# Patient Record
Sex: Female | Born: 1945 | Race: Black or African American | Hispanic: No | Marital: Married | Smoking: Former smoker
Health system: Southern US, Community
[De-identification: ages and names within clinical notes are randomized; demographics above are authoritative.]

## PROBLEM LIST (undated history)

## (undated) DIAGNOSIS — G4700 Insomnia, unspecified: Secondary | ICD-10-CM

## (undated) DIAGNOSIS — M1990 Unspecified osteoarthritis, unspecified site: Secondary | ICD-10-CM

## (undated) DIAGNOSIS — K635 Polyp of colon: Secondary | ICD-10-CM

## (undated) DIAGNOSIS — I639 Cerebral infarction, unspecified: Secondary | ICD-10-CM

## (undated) DIAGNOSIS — C181 Malignant neoplasm of appendix: Secondary | ICD-10-CM

## (undated) DIAGNOSIS — I739 Peripheral vascular disease, unspecified: Secondary | ICD-10-CM

## (undated) DIAGNOSIS — K5790 Diverticulosis of intestine, part unspecified, without perforation or abscess without bleeding: Secondary | ICD-10-CM

## (undated) DIAGNOSIS — R718 Other abnormality of red blood cells: Secondary | ICD-10-CM

## (undated) DIAGNOSIS — I10 Essential (primary) hypertension: Secondary | ICD-10-CM

## (undated) HISTORY — DX: Diverticulosis of intestine, part unspecified, without perforation or abscess without bleeding: K57.90

## (undated) HISTORY — PX: CYST REMOVAL NECK: SHX6281

## (undated) HISTORY — DX: Other abnormality of red blood cells: R71.8

## (undated) HISTORY — DX: Peripheral vascular disease, unspecified: I73.9

## (undated) HISTORY — DX: Cerebral infarction, unspecified: I63.9

## (undated) HISTORY — DX: Insomnia, unspecified: G47.00

## (undated) HISTORY — DX: Essential (primary) hypertension: I10

## (undated) HISTORY — DX: Unspecified osteoarthritis, unspecified site: M19.90

## (undated) HISTORY — DX: Polyp of colon: K63.5

---

## 2006-09-17 ENCOUNTER — Ambulatory Visit (HOSPITAL_COMMUNITY): Admission: RE | Admit: 2006-09-17 | Discharge: 2006-09-17 | Payer: Self-pay | Admitting: Obstetrics and Gynecology

## 2006-12-25 ENCOUNTER — Encounter: Admission: RE | Admit: 2006-12-25 | Discharge: 2006-12-25 | Payer: Self-pay | Admitting: Obstetrics and Gynecology

## 2007-07-03 HISTORY — PX: ABDOMINAL HYSTERECTOMY: SHX81

## 2008-01-09 ENCOUNTER — Ambulatory Visit: Payer: Self-pay | Admitting: Internal Medicine

## 2008-01-19 ENCOUNTER — Ambulatory Visit: Payer: Self-pay | Admitting: Internal Medicine

## 2008-01-19 ENCOUNTER — Encounter: Payer: Self-pay | Admitting: Internal Medicine

## 2008-01-21 ENCOUNTER — Encounter: Payer: Self-pay | Admitting: Internal Medicine

## 2008-02-12 ENCOUNTER — Encounter: Admission: RE | Admit: 2008-02-12 | Discharge: 2008-02-12 | Payer: Self-pay | Admitting: Obstetrics and Gynecology

## 2008-02-23 ENCOUNTER — Ambulatory Visit (HOSPITAL_COMMUNITY): Admission: RE | Admit: 2008-02-23 | Discharge: 2008-02-24 | Payer: Self-pay | Admitting: Obstetrics and Gynecology

## 2008-02-23 ENCOUNTER — Encounter (INDEPENDENT_AMBULATORY_CARE_PROVIDER_SITE_OTHER): Payer: Self-pay | Admitting: Obstetrics and Gynecology

## 2009-02-21 ENCOUNTER — Encounter: Admission: RE | Admit: 2009-02-21 | Discharge: 2009-02-21 | Payer: Self-pay | Admitting: Obstetrics and Gynecology

## 2009-02-24 ENCOUNTER — Encounter: Admission: RE | Admit: 2009-02-24 | Discharge: 2009-02-24 | Payer: Self-pay | Admitting: Obstetrics and Gynecology

## 2010-02-27 ENCOUNTER — Encounter: Admission: RE | Admit: 2010-02-27 | Discharge: 2010-02-27 | Payer: Self-pay | Admitting: Obstetrics and Gynecology

## 2010-04-07 ENCOUNTER — Encounter: Admission: RE | Admit: 2010-04-07 | Discharge: 2010-04-07 | Payer: Self-pay | Admitting: Internal Medicine

## 2010-05-02 ENCOUNTER — Ambulatory Visit: Payer: Self-pay | Admitting: Vascular Surgery

## 2010-07-23 ENCOUNTER — Encounter: Payer: Self-pay | Admitting: Obstetrics and Gynecology

## 2010-11-14 NOTE — Op Note (Signed)
Andrea Keller, Andrea Keller               ACCOUNT NO.:  [id number]   MEDICAL RECORD NO.:  [id number]          PATIENT TYPE:  OIB   LOCATION:  9318                          FACILITY:  [facility]   PHYSICIAN:  Duke Salvia. Marcelle Overlie, M.D.DATE OF BIRTH:  1945/07/12   DATE OF PROCEDURE:  02/23/2008  DATE OF DISCHARGE:                               OPERATIVE REPORT   PREOPERATIVE DIAGNOSES:  Symptomatic uterine prolapse with cystocele and  rectocele.   POSTOPERATIVE DIAGNOSES:  Symptomatic uterine prolapse with cystocele  and rectocele.   PROCEDURE:  1. Laparoscopic-assisted vaginal hysterectomy.  2. Bilateral salpingo-oophorectomy.  3. Anterior and posterior repair.   SURGEON:  Duke Salvia. Marcelle Overlie, MD   ASSISTANT:  Zelphia Cairo, MD   ANESTHESIA:  General endotracheal.   COMPLICATIONS:  None.   DRAINS:  Foley catheter.   BLOOD LOSS:  200 mL.   PROCEDURE AND FINDINGS:  The patient was taken to the operating room  after an adequate level of general endotracheal anesthesia was obtained  with the patient's legs in stirrups.  The abdomen, perineum, and vagina  were prepped and draped in usual manner for laparoscopy.  Hulka  tenaculum was positioned.  There was significant uterus itself was  normal size, mid to posterior with moderate prolapse, cervix close to  the introitus, cystocele and a small rectocele noted on EUA.  The  periumbilical area was infiltrated with 0.25% Marcaine.  A small  incision was made.  The Veress needle was introduced without difficulty.  Its intra-abdominal position was verified by pressure and water testing.  After 2.5 liter pneumoperitoneum was created, laparoscopic trocar and  sleeve were then introduced without difficulty.  Three fingerbreadths  above the symphysis in the midline, a 5-mm trocar was inserted under  direct visualization.  The uterus itself was normal size, was  retroflexed and positioned.  Posterior cul-de-sac unremarkable.  Both  ovaries looked  atrophic.  No other abnormalities were noted.  Blunt  grasper was then used to grasp the right tube and ovary, which were  placed on traction toward the midline.  Right pelvic sidewall was  observed and the course of the ureter was traced well below.  The right  IP ligament was then coagulated and divided with the EnSeal instrument  down to and including the round ligament.  The exact same repeated on  the opposite side after carefully identifying the course of the ureter  below.  The laparoscopic portion of procedure was stopped at that point.  The legs were extended.  A weighted-speculum was placed vaginally,  cervix was grasped with the tenaculum.  The cervical vaginal mucosa was  incised with the Bovie.  Posterior colpotomy performed without  difficulty.  The bladder was advanced superiorly with sharp and blunt  dissection until the peritoneal reflection could be identified.  The  peritoneum was then entered sharply in a retractor used to gently  elevate the bladder out of the field.  Staying close to the uterus in  sequential manner, the LigaSure instrument was used to coagulate and  divide the uterosacral ligament, cardinal ligament, uterine vasculature  pedicles and upper  broad ligament pedicles.  The fundus of the uterus  was then delivered posteriorly.  Remaining pedicles were clamped,  divided and the specimen was removed. Free tie 0 Vicryl was used on the  last 2 pedicles.  The cuff was then closed from 3-9 o'clock with a  running locked 2-0 Vicryl suture.  Prior to closure, sponge, needle, and  instrument counts were reported as correct x2.  The vaginal mucosa  closed right-to-left with 2-0 Monocryl sutures.  Allis clamps were then  used to place the distal anterior vagina on traction and anterior repair  was carried out by dividing the mucosa in the midline from the level of  the cuff up to several centimeters below the urethra.  Sharp and blunt  dissection was then used to  dissect the paravesical tissue.  The  paravesical fascia was then plicated in the midline with 2-0 Vicryl  sutures reducing the cystocele.  Excess vaginal mucosa was trimmed and  then the mucosa reapproximated with 2-0 Monocryl interrupted sutures.  A  small rectocele was noted.  A small triangle of perineal skin was  excised and the posterior vaginal mucosa was dissected in the midline  approximately halfway up.  Sharp and blunt dissection was then used to  reduce the rectocele.  The perirectal fascia was then plicated in the  midline with 2-0 Vicryl sutures.  A very small amount of posterior  mucosa was excised and then the posterior vaginal mucosa was  reapproximated with 2-0 Vicryl sutures, 3-0 Vicryl Rapide used on the  perineum, estrogen and Estrace laced, vaginal pack was then used to  perform vaginal packing and a Foley catheter was positioned.  Repeat  laparoscopy was carried out revealing excellent hemostasis even at  reduced pressure.  No irrigation was carried out because of the  excellent hemostasis.  Instruments were removed.  Gas was allowed to  escape.  The defects were closed with 4-0 Vicryl subcuticular sutures  and Dermabond.  She tolerated this well and went to recovery room in  good condition.      Richard M. Marcelle Overlie, M.D.  Electronically Signed     RMH/MEDQ  D:  02/23/2008  T:  02/23/2008  Job:  [id number]

## 2010-11-14 NOTE — H&P (Signed)
NAMEBETTYE, Andrea Keller               ACCOUNT NO.:  [id number]   MEDICAL RECORD NO.:  [id number]          PATIENT TYPE:  AMB   LOCATION:                                FACILITY:  [facility]   PHYSICIAN:  Duke Salvia. Marcelle Overlie, M.D.DATE OF BIRTH:  05/24/1946   DATE OF ADMISSION:  02/23/2008  DATE OF DISCHARGE:                              HISTORY & PHYSICAL   CHIEF COMPLAINT:  Pelvic relaxation, symptomatic cystocele.   HISTORY OF PRESENT ILLNESS:  A 65 year old G2 P2, she has had one  cesarean and one vaginal delivery presents with a 8-month history of a  increased bulge in the pelvic area especially on standing.  No SUI  symptoms.  Examination in the office revealed a significant cystocele,  small rectocele.  Pelvic ultrasound was done in March 2008, when she  initially presented with some of these symptoms that showed normal  pelvic findings.  Symptoms have worsened and she presents now for  definitive LAVH/BSO with anterior and posterior repair.  This procedure  including risks related to bleeding, infection, adjacent organ injury,  transfusion, the possible need for open additional surgery,  postoperative catheterization, and other risks related to wound,  infection, and phlebitis are expected, and postoperative recovery time  all reviewed with her which she understands and accepts.   In the interim, she has had screening colonoscopy that was normal  recently.  Mammogram was up-to-date also and was normal.   PAST MEDICAL HISTORY:   CURRENT MEDICATIONS:  Amlodipine.   REVIEW OF SYSTEMS:  Significant for the patient with hypertension.   Also positive family history of hypertension.   PRIOR SURGERY:  Cesarean section x1, vaginal delivery x1 as noted.   PHYSICAL EXAMINATION:  VITAL SIGNS:  Temperature 98.2, blood pressure  138/72.  HEENT:  Unremarkable.  NECK:  Supple without masses.  LUNGS: Clear.  CARDIOVASCULAR:  Regular rate and rhythm without murmurs, rubs, or  gallops noted.  BREASTS:  Without masses.  ABDOMEN:  Soft, flat, and nontender.  PELVIC EXAM:  Vulva unremarkable.  A moderate cystocele was noted on  examination with a small rectocele and mild uterine prolapse.  Uterus,  mid, posterior, normal size, and mobile.  Adnexa negative.  NEUROLOGIC EXAM:  Unremarkable.   IMPRESSION:  Symptomatic uterine prolapse with cystocele and rectocele.   PLAN:  LAVH/BSO, anterior posterior repair.  Procedure and risks  reviewed as above.      Richard M. Marcelle Overlie, M.D.  Electronically Signed     RMH/MEDQ  D:  02/16/2008  T:  02/17/2008  Job:  [id number]

## 2010-11-14 NOTE — Consult Note (Signed)
NEW PATIENT CONSULTATION   Andrea Keller, Andrea Keller  DOB:  1946/05/02                                       05/02/2010  EAVWU#:[id number]   Correne Lalani presents today for discussion of recent outpatient carotid  duplex evaluation.  She had the study at [facility].  This showed  intimal thickening with no severe stenosis.  She specifically denies any  prior amaurosis fugax, transient ischemic attack or stroke.   PAST MEDICAL HISTORY:  Significant for hypertension.  She does not have  diabetes or cardiac disease or history of congestive heart failure.   SOCIAL HISTORY:  She is retired, married with 2 children.  She quit  smoking on April 01, 2010 and does not drink alcohol.   FAMILY HISTORY:  Negative for premature atherosclerotic disease.   REVIEW OF SYSTEMS:  Positive for some weight gain up to 186 pounds.  She  is 5 feet 2 inches tall.  She does have some pain in her legs with  walking.  GI:  Positive for some occasional swallowing difficulty.  ENT for nosebleeds.  MUSCULOSKELETAL:  Positive for joint pain.  Otherwise review of systems is negative except for history of present  illness.   PHYSICAL EXAMINATION:  Well-developed, well-nourished black female  appearing her stated age of 50.  Blood pressure 127/75, pulse 57, respirations 16.  HEENT:  Normal.  Her carotid arteries are without bruits bilaterally.  Her radial pulses  are 2+.  She has 2+ femoral pulses bilaterally.  CHEST:  Clear bilaterally without rales, rhonchi or wheezes.  HEART:  Regular rate rhythm.  ABDOMEN:  Soft, nontender.  No masses noted.  MUSCULOSKELETAL:  Shows no major deformities or cyanosis.  NEUROLOGIC:  No focal weakness or paresthesias.  SKIN:  Without ulcers or rashes.  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  She does not have any venous  varicosities.   I have reviewed her duplex with her.  This does show intimal thickening  with no  evidence of flow-limiting stenosis.  I have reassured Ms.  Denis that I would not recommend any further follow-up due to the  very mild degree of narrowing.  She understands that if she should  develop any neurologic deficits, we would recommend repeat evaluation at  that time.     Larina Earthly, M.D.  Electronically Signed   TFE/MEDQ  D:  05/02/2010  T:  05/03/2010  Job:  [id number]

## 2010-11-14 NOTE — Discharge Summary (Signed)
NAMESAMHITHA, Andrea Keller               ACCOUNT NO.:  [id number]   MEDICAL RECORD NO.:  [id number]          PATIENT TYPE:  OIB   LOCATION:  9318                          [facility]ACILITY:  [facility]   PHYSICIAN:  Duke Salvia. Marcelle Overlie, M.D.DATE OF BIRTH:  1946-06-08   DATE OF ADMISSION:  02/23/2008  DATE OF DISCHARGE:  02/24/2008                               DISCHARGE SUMMARY   DISCHARGE DIAGNOSIS:  1. Symptomatic uterine prolapse with cystocele and rectocele.  2. Laparoscopic-assisted vaginal hysterectomy, bilateral salpingo-      oophorectomy, anterior and posterior repair.   Summary of the history and physical exam, please see admission H and P  for details.  Briefly, a 65 year old with symptomatic cystocele and  uterine prolapse presents for surgical correction.   HOSPITAL COURSE:  On February 23, 2008, under general anesthesia, the  patient underwent LAVH, BSO with A and P repair.  First postop day, her  vaginal packing was removed.  Her catheter was discontinued.  She was  voiding without difficulty, was afebrile, ambulating normally, and  tolerating regular diet.  Her abdominal exam was unremarkable at that  point and she was ready for discharge.   LABORATORY DATA:  On admission, CMET normal.  CBC on admission,  hemoglobin 12.0, hematocrit 38.4, and platelet count 264,000.  MCV  slightly low at 74.5.  On the first postop day February 24, 2008, WBC  13.6, hemoglobin 10.6, and hematocrit 33.7.   DISPOSITION:  The patient discharged on Tylox p.r.n. pain, stool  softeners daily, laxative use p.r.n. was discussed.  Advised to report  any fever over 101, difficulties with voiding, and increased vaginal  bleeding.  She was given specific instructions regarding diet, sex, and  exercise.  She will return to the office in 1 week.   CONDITION:  Good.   ACTIVITY:  Graded increase.      Richard M. Marcelle Overlie, M.D.  Electronically Signed     RMH/MEDQ  D:  02/24/2008  T:  02/24/2008  Job:  [id number]

## 2010-12-09 ENCOUNTER — Emergency Department (HOSPITAL_COMMUNITY)
Admission: EM | Admit: 2010-12-09 | Discharge: 2010-12-10 | Disposition: A | Discharge status: Home / Self Care | Payer: Medicare Other | Attending: Emergency Medicine | Admitting: Emergency Medicine

## 2010-12-09 DIAGNOSIS — N898 Other specified noninflammatory disorders of vagina: Secondary | ICD-10-CM | POA: Insufficient documentation

## 2010-12-09 DIAGNOSIS — I10 Essential (primary) hypertension: Secondary | ICD-10-CM | POA: Insufficient documentation

## 2010-12-09 LAB — CBC
HCT: 34.9 % — ABNORMAL LOW (ref 36.0–46.0)
MCH: 22.7 pg — ABNORMAL LOW (ref 26.0–34.0)
MCHC: 32.4 g/dL (ref 30.0–36.0)
Platelets: 282 10*3/uL (ref 150–400)
RBC: 4.97 MIL/uL (ref 3.87–5.11)
RDW: 16.1 % — ABNORMAL HIGH (ref 11.5–15.5)
WBC: 9.5 10*3/uL (ref 4.0–10.5)

## 2010-12-09 LAB — DIFFERENTIAL
Basophils Absolute: 0.1 10*3/uL (ref 0.0–0.1)
Basophils Relative: 1 % (ref 0–1)
Eosinophils Relative: 4 % (ref 0–5)
Lymphocytes Relative: 42 % (ref 12–46)
Monocytes Absolute: 1 10*3/uL (ref 0.1–1.0)
Monocytes Relative: 10 % (ref 3–12)
Neutro Abs: 4.1 10*3/uL (ref 1.7–7.7)
Neutrophils Relative %: 43 % (ref 43–77)

## 2010-12-10 LAB — URINALYSIS, ROUTINE W REFLEX MICROSCOPIC
Bilirubin Urine: NEGATIVE
Hgb urine dipstick: NEGATIVE
Ketones, ur: NEGATIVE mg/dL
Leukocytes, UA: NEGATIVE
Nitrite: NEGATIVE
Protein, ur: NEGATIVE mg/dL
Specific Gravity, Urine: 1.022 (ref 1.005–1.030)
Urobilinogen, UA: 0.2 mg/dL (ref 0.0–1.0)

## 2010-12-10 LAB — WET PREP, GENITAL
Clue Cells Wet Prep HPF POC: NONE SEEN
Trich, Wet Prep: NONE SEEN
Yeast Wet Prep HPF POC: NONE SEEN

## 2010-12-11 LAB — URINE CULTURE
Colony Count: NO GROWTH
Culture  Setup Time: 201206101126
Culture: NO GROWTH

## 2010-12-12 LAB — GC/CHLAMYDIA PROBE AMP, GENITAL
Chlamydia, DNA Probe: NEGATIVE
GC Probe Amp, Genital: NEGATIVE

## 2011-03-27 ENCOUNTER — Other Ambulatory Visit: Payer: Self-pay | Admitting: Obstetrics and Gynecology

## 2011-03-27 DIAGNOSIS — Z1231 Encounter for screening mammogram for malignant neoplasm of breast: Secondary | ICD-10-CM

## 2011-04-09 ENCOUNTER — Ambulatory Visit
Admission: RE | Admit: 2011-04-09 | Discharge: 2011-04-09 | Disposition: A | Discharge status: Home / Self Care | Payer: Medicare Other | Source: Ambulatory Visit | Attending: Obstetrics and Gynecology | Admitting: Obstetrics and Gynecology

## 2011-04-09 DIAGNOSIS — Z1231 Encounter for screening mammogram for malignant neoplasm of breast: Secondary | ICD-10-CM

## 2012-04-14 ENCOUNTER — Other Ambulatory Visit: Payer: Self-pay | Admitting: Obstetrics and Gynecology

## 2012-04-14 DIAGNOSIS — Z1231 Encounter for screening mammogram for malignant neoplasm of breast: Secondary | ICD-10-CM

## 2012-05-12 ENCOUNTER — Ambulatory Visit
Admission: RE | Admit: 2012-05-12 | Discharge: 2012-05-12 | Disposition: A | Discharge status: Home / Self Care | Payer: Medicare Other | Source: Ambulatory Visit | Attending: Obstetrics and Gynecology | Admitting: Obstetrics and Gynecology

## 2012-05-12 DIAGNOSIS — Z1231 Encounter for screening mammogram for malignant neoplasm of breast: Secondary | ICD-10-CM

## 2013-04-06 ENCOUNTER — Encounter: Payer: Self-pay | Admitting: *Deleted

## 2013-04-06 ENCOUNTER — Encounter: Payer: Self-pay | Admitting: Internal Medicine

## 2013-04-14 ENCOUNTER — Encounter: Payer: Self-pay | Admitting: *Deleted

## 2013-04-27 ENCOUNTER — Other Ambulatory Visit: Payer: Self-pay

## 2013-04-27 DIAGNOSIS — Z1231 Encounter for screening mammogram for malignant neoplasm of breast: Secondary | ICD-10-CM

## 2013-05-12 ENCOUNTER — Ambulatory Visit
Admission: RE | Admit: 2013-05-12 | Discharge: 2013-05-12 | Disposition: A | Discharge status: Home / Self Care | Payer: Medicare Other | Source: Ambulatory Visit

## 2013-05-12 DIAGNOSIS — Z1231 Encounter for screening mammogram for malignant neoplasm of breast: Secondary | ICD-10-CM

## 2013-05-26 ENCOUNTER — Ambulatory Visit: Payer: Medicare Other

## 2013-06-12 ENCOUNTER — Encounter: Payer: Self-pay | Admitting: Internal Medicine

## 2013-06-12 ENCOUNTER — Ambulatory Visit (INDEPENDENT_AMBULATORY_CARE_PROVIDER_SITE_OTHER): Payer: Medicare Other | Admitting: Internal Medicine

## 2013-06-12 VITALS — BP 128/70 | HR 80 | Ht 62.0 in | Wt 183.0 lb

## 2013-06-12 DIAGNOSIS — Z1211 Encounter for screening for malignant neoplasm of colon: Secondary | ICD-10-CM

## 2013-06-12 DIAGNOSIS — Z800 Family history of malignant neoplasm of digestive organs: Secondary | ICD-10-CM

## 2013-06-12 MED ORDER — MOVIPREP 100 G PO SOLR: 1.0000 | Freq: Once | ORAL | Status: DC

## 2013-06-12 NOTE — Patient Instructions (Addendum)
You have been scheduled for a colonoscopy with propofol. Please follow written instructions given to you at your visit today.  Please pick up your prep kit at the pharmacy within the next 1-3 days. If you use inhalers (even only as needed), please bring them with you on the day of your procedure. Your physician has requested that you go to www.startemmi.com and enter the access code given to you at your visit today. This web site gives a general overview about your procedure. However, you should still follow specific instructions given to you by our office regarding your preparation for the procedure.  CC: Dr Dossie Arbour

## 2013-06-12 NOTE — Progress Notes (Signed)
Andrea Keller 1946/06/15 [id number]   History of Present Illness:  This is a 67 year old African American female who is here to discuss colorectal screening. She had a screening colonoscopy in July 2009 for Hemoccult-positive anemia. She was found to have a hyperplastic polyp and mild diverticulosis of the left colon. She was told to have a recall colonoscopy in 10 years, but in the interim, her brother developed colon cancer and passed away. Her risk factors have changed, therefore, she is due for a recall colonoscopy now. She denies any symptoms.  Past Medical History  Diagnosis Date  . Diverticulosis   . Hyperplastic colon polyp   . Insomnia   . Microcytosis   . Osteoarthritis   . PVD (peripheral vascular disease)   . Multiple thyroid nodules   . Hypertension     Past Surgical History  Procedure Laterality Date  . Cyst removal neck    . Abdominal hysterectomy  2009    No Known Allergies  Review of Systems: Negative for abdominal pain rectal bleeding or weight loss  The remainder of the 10 point ROS is negative except as outlined in the H&P  Physical Exam: General Appearance Well developed, in no distress. Psychological Normal mood and affect  Assessment and Plan:   Problem #1 family history of colon cancer in a direct relative. Patient had a screening colonoscopy in July 2009 and had a hyperplastic polyp. Because of the increased risk for colon cancer, it is recommended to undergo a recall colonoscopy in 5 years. She agrees with the plan and will schedule a repeat procedure in January 2015. We have discussed the sedation, prep and the procedure.    Lina Sar 06/12/2013

## 2013-06-15 ENCOUNTER — Encounter: Payer: Self-pay | Admitting: Internal Medicine

## 2013-08-12 ENCOUNTER — Encounter: Payer: Self-pay | Admitting: Internal Medicine

## 2013-08-12 ENCOUNTER — Ambulatory Visit (AMBULATORY_SURGERY_CENTER): Payer: Medicare Other | Admitting: Internal Medicine

## 2013-08-12 VITALS — BP 125/65 | HR 58 | Temp 97.3°F | Resp 22 | Ht 62.0 in | Wt 183.0 lb

## 2013-08-12 DIAGNOSIS — Z800 Family history of malignant neoplasm of digestive organs: Secondary | ICD-10-CM

## 2013-08-12 DIAGNOSIS — Z1211 Encounter for screening for malignant neoplasm of colon: Secondary | ICD-10-CM

## 2013-08-12 MED ORDER — SODIUM CHLORIDE 0.9 % IV SOLN: 500.0000 mL | INTRAVENOUS | Status: DC

## 2013-08-12 NOTE — Op Note (Signed)
[place]  [address]. [place], [address]   COLONOSCOPY PROCEDURE REPORT  PATIENT: Andrea Keller, Andrea Keller  MR#: [id number] BIRTHDATE: 04/29/1946 , 67  yrs. old GENDER: Female ENDOSCOPIST: Lafayette Dragon, MD REFERRED GH:WEXHB Jeanie Cooks, M.D. PROCEDURE DATE:  08/12/2013 PROCEDURE:   Colonoscopy, screening First Screening Colonoscopy - Avg.  risk and is 50 yrs.  old or older - No.  Prior Negative Screening - Now for repeat screening. N/A  History of Adenoma - Now for follow-up colonoscopy & has been > or = to 3 yrs.  N/A  Polyps Removed Today? No.  Recommend repeat exam, <10 yrs? Yes.  High risk (family or personal hx). ASA CLASS:   Class II INDICATIONS:Patient's immediate family history of colon cancer and plastic polyp in July 2009.  On cancer in patient's brother. MEDICATIONS: MAC sedation, administered by CRNA and propofol (Diprivan) 200mg  IV  DESCRIPTION OF PROCEDURE:   After the risks benefits and alternatives of the procedure were thoroughly explained, informed consent was obtained.  A digital rectal exam revealed no abnormalities of the rectum.   The LB [id number]  endoscope was introduced through the anus and advanced to the cecum, which was identified by both the appendix and ileocecal valve. No adverse events experienced.   The quality of the prep was good, using MoviPrep  The instrument was then slowly withdrawn as the colon was fully examined.      COLON FINDINGS: A normal appearing cecum, ileocecal valve, and appendiceal orifice were identified.  The ascending, hepatic flexure, transverse, splenic flexure, descending, sigmoid colon and rectum appeared unremarkable.  No polyps or cancers were seen. Retroflexed views revealed no abnormalities. The time to cecum=4 minutes 05 seconds.  Withdrawal time=6 minutes 20 seconds.  The scope was withdrawn and the procedure completed. COMPLICATIONS: There were no complications.  ENDOSCOPIC  IMPRESSION: Normal colon  RECOMMENDATIONS: high fiber diet Recall colonoscopy in 5 years   eSigned:  Lafayette Dragon, MD 08/12/2013 2:14 PM   cc:   PATIENT NAME:  Andrea Keller, Andrea Keller MR#: [id number]

## 2013-08-12 NOTE — Patient Instructions (Signed)
YOU HAD AN ENDOSCOPIC PROCEDURE TODAY AT THE [facility]: Refer to the procedure report that was given to you for any specific questions about what was found during the examination.  If the procedure report does not answer your questions, please call your gastroenterologist to clarify.  If you requested that your care partner not be given the details of your procedure findings, then the procedure report has been included in a sealed envelope for you to review at your convenience later.  YOU SHOULD EXPECT: Some feelings of bloating in the abdomen. Passage of more gas than usual.  Walking can help get rid of the air that was put into your GI tract during the procedure and reduce the bloating. If you had a lower endoscopy (such as a colonoscopy or flexible sigmoidoscopy) you may notice spotting of blood in your stool or on the toilet paper. If you underwent a bowel prep for your procedure, then you may not have a normal bowel movement for a few days.  DIET: Your first meal following the procedure should be a light meal and then it is ok to progress to your normal diet.  A half-sandwich or bowl of soup is an example of a good first meal.  Heavy or fried foods are harder to digest and may make you feel nauseous or bloated.  Likewise meals heavy in dairy and vegetables can cause extra gas to form and this can also increase the bloating.  Drink plenty of fluids but you should avoid alcoholic beverages for 24 hours.  ACTIVITY: Your care partner should take you home directly after the procedure.  You should plan to take it easy, moving slowly for the rest of the day.  You can resume normal activity the day after the procedure however you should NOT DRIVE or use heavy machinery for 24 hours (because of the sedation medicines used during the test).    SYMPTOMS TO REPORT IMMEDIATELY: A gastroenterologist can be reached at any hour.  During normal business hours, 8:30 AM to 5:00 PM Monday through Friday,  call [phone].  After hours and on weekends, please call the GI answering service at [phone] who will take a message and have the physician on call contact you.   Following lower endoscopy (colonoscopy or flexible sigmoidoscopy):  Excessive amounts of blood in the stool  Significant tenderness or worsening of abdominal pains  Swelling of the abdomen that is new, acute  Fever of 100F or higher    FOLLOW UP: If any biopsies were taken you will be contacted by phone or by letter within the next 1-3 weeks.  Call your gastroenterologist if you have not heard about the biopsies in 3 weeks.  Our staff will call the home number listed on your records the next business day following your procedure to check on you and address any questions or concerns that you may have at that time regarding the information given to you following your procedure. This is a courtesy call and so if there is no answer at the home number and we have not heard from you through the emergency physician on call, we will assume that you have returned to your regular daily activities without incident.  SIGNATURES/CONFIDENTIALITY: You and/or your care partner have signed paperwork which will be entered into your electronic medical record.  These signatures attest to the fact that that the information above on your After Visit Summary has been reviewed and is understood.  Full responsibility of the confidentiality   discharge information lies with you and/or your care-partner.   High fiber diet information given to you today 

## 2013-08-13 ENCOUNTER — Telehealth: Payer: Self-pay

## 2013-08-13 NOTE — Telephone Encounter (Signed)
  Follow up Call-  Call back number 08/12/2013  Post procedure Call Back phone  # [phone]  Permission to leave phone message No  comments no answering machine     Patient questions:  Do you have a fever, pain , or abdominal swelling? no Pain Score  0 *  Have you tolerated food without any problems? yes  Have you been able to return to your normal activities? yes  Do you have any questions about your discharge instructions: Diet   no Medications  no Follow up visit  no  Do you have questions or concerns about your Care? no  Actions: * If pain score is 4 or above: No action needed, pain <4.

## 2014-04-26 ENCOUNTER — Other Ambulatory Visit: Payer: Self-pay

## 2014-04-26 DIAGNOSIS — Z1231 Encounter for screening mammogram for malignant neoplasm of breast: Secondary | ICD-10-CM

## 2014-05-14 ENCOUNTER — Ambulatory Visit
Admission: RE | Admit: 2014-05-14 | Discharge: 2014-05-14 | Disposition: A | Discharge status: Home / Self Care | Payer: Medicare Other | Source: Ambulatory Visit

## 2014-05-14 DIAGNOSIS — Z1231 Encounter for screening mammogram for malignant neoplasm of breast: Secondary | ICD-10-CM

## 2014-10-28 ENCOUNTER — Emergency Department (HOSPITAL_COMMUNITY): Payer: Medicare Other

## 2014-10-28 ENCOUNTER — Emergency Department (HOSPITAL_COMMUNITY): Payer: Medicare Other | Admitting: Certified Registered Nurse Anesthetist

## 2014-10-28 ENCOUNTER — Encounter (HOSPITAL_COMMUNITY): Admission: EM | Disposition: A | Discharge status: Home / Self Care | Payer: Self-pay | Source: Home / Self Care

## 2014-10-28 ENCOUNTER — Inpatient Hospital Stay (HOSPITAL_COMMUNITY)
Admission: EM | Admit: 2014-10-28 | Discharge: 2014-11-08 | DRG: 338 | Disposition: A | Discharge status: Home / Self Care | Payer: Medicare Other | Attending: Surgery | Admitting: Surgery

## 2014-10-28 ENCOUNTER — Encounter (HOSPITAL_COMMUNITY): Payer: Self-pay | Admitting: *Deleted

## 2014-10-28 DIAGNOSIS — G4700 Insomnia, unspecified: Secondary | ICD-10-CM | POA: Diagnosis present

## 2014-10-28 DIAGNOSIS — Z9049 Acquired absence of other specified parts of digestive tract: Secondary | ICD-10-CM

## 2014-10-28 DIAGNOSIS — K5790 Diverticulosis of intestine, part unspecified, without perforation or abscess without bleeding: Secondary | ICD-10-CM | POA: Diagnosis present

## 2014-10-28 DIAGNOSIS — K3532 Acute appendicitis with perforation and localized peritonitis, without abscess: Secondary | ICD-10-CM | POA: Diagnosis present

## 2014-10-28 DIAGNOSIS — R109 Unspecified abdominal pain: Secondary | ICD-10-CM | POA: Diagnosis present

## 2014-10-28 DIAGNOSIS — K353 Acute appendicitis with localized peritonitis, without perforation or gangrene: Secondary | ICD-10-CM

## 2014-10-28 DIAGNOSIS — IMO0002 Reserved for concepts with insufficient information to code with codable children: Secondary | ICD-10-CM

## 2014-10-28 DIAGNOSIS — K567 Ileus, unspecified: Secondary | ICD-10-CM | POA: Diagnosis not present

## 2014-10-28 DIAGNOSIS — D72829 Elevated white blood cell count, unspecified: Secondary | ICD-10-CM | POA: Diagnosis not present

## 2014-10-28 DIAGNOSIS — D696 Thrombocytopenia, unspecified: Secondary | ICD-10-CM | POA: Diagnosis not present

## 2014-10-28 DIAGNOSIS — C181 Malignant neoplasm of appendix: Principal | ICD-10-CM | POA: Diagnosis present

## 2014-10-28 DIAGNOSIS — I10 Essential (primary) hypertension: Secondary | ICD-10-CM | POA: Diagnosis present

## 2014-10-28 DIAGNOSIS — M1990 Unspecified osteoarthritis, unspecified site: Secondary | ICD-10-CM | POA: Diagnosis present

## 2014-10-28 DIAGNOSIS — D630 Anemia in neoplastic disease: Secondary | ICD-10-CM | POA: Diagnosis present

## 2014-10-28 DIAGNOSIS — Z86010 Personal history of colonic polyps: Secondary | ICD-10-CM | POA: Diagnosis not present

## 2014-10-28 DIAGNOSIS — N993 Prolapse of vaginal vault after hysterectomy: Secondary | ICD-10-CM | POA: Diagnosis present

## 2014-10-28 DIAGNOSIS — Z87891 Personal history of nicotine dependence: Secondary | ICD-10-CM | POA: Diagnosis not present

## 2014-10-28 DIAGNOSIS — T814XXA Infection following a procedure, initial encounter: Secondary | ICD-10-CM | POA: Diagnosis not present

## 2014-10-28 DIAGNOSIS — I739 Peripheral vascular disease, unspecified: Secondary | ICD-10-CM | POA: Diagnosis present

## 2014-10-28 DIAGNOSIS — K352 Acute appendicitis with generalized peritonitis: Secondary | ICD-10-CM | POA: Diagnosis present

## 2014-10-28 DIAGNOSIS — K3580 Unspecified acute appendicitis: Secondary | ICD-10-CM | POA: Diagnosis present

## 2014-10-28 HISTORY — PX: APPENDECTOMY: SHX54

## 2014-10-28 HISTORY — DX: Malignant neoplasm of appendix: C18.1

## 2014-10-28 HISTORY — PX: LAPAROSCOPIC APPENDECTOMY: SHX408

## 2014-10-28 LAB — I-STAT CHEM 8, ED
BUN: 16 mg/dL (ref 6–23)
CHLORIDE: 105 mmol/L (ref 96–112)
Calcium, Ion: 1.11 mmol/L — ABNORMAL LOW (ref 1.13–1.30)
Creatinine, Ser: 1.1 mg/dL (ref 0.50–1.10)
Glucose, Bld: 128 mg/dL — ABNORMAL HIGH (ref 70–99)
HCT: 44 % (ref 36.0–46.0)
Hemoglobin: 15 g/dL (ref 12.0–15.0)
Potassium: 4.6 mmol/L (ref 3.5–5.1)
SODIUM: 138 mmol/L (ref 135–145)
TCO2: 19 mmol/L (ref 0–100)

## 2014-10-28 LAB — URINALYSIS, ROUTINE W REFLEX MICROSCOPIC
Bilirubin Urine: NEGATIVE
Glucose, UA: NEGATIVE mg/dL
Hgb urine dipstick: NEGATIVE
Ketones, ur: 15 mg/dL — AB
LEUKOCYTES UA: NEGATIVE
Nitrite: NEGATIVE
PH: 5.5 (ref 5.0–8.0)
Protein, ur: NEGATIVE mg/dL
Specific Gravity, Urine: 1.021 (ref 1.005–1.030)
Urobilinogen, UA: 0.2 mg/dL (ref 0.0–1.0)

## 2014-10-28 LAB — CBC WITH DIFFERENTIAL/PLATELET
Basophils Absolute: 0 10*3/uL (ref 0.0–0.1)
Basophils Relative: 0 % (ref 0–1)
Eosinophils Absolute: 0 10*3/uL (ref 0.0–0.7)
Eosinophils Relative: 0 % (ref 0–5)
HCT: 39.7 % (ref 36.0–46.0)
Hemoglobin: 12.8 g/dL (ref 12.0–15.0)
Lymphocytes Relative: 8 % — ABNORMAL LOW (ref 12–46)
Lymphs Abs: 1 10*3/uL (ref 0.7–4.0)
MCH: 23.1 pg — ABNORMAL LOW (ref 26.0–34.0)
MCHC: 32.2 g/dL (ref 30.0–36.0)
MCV: 71.5 fL — ABNORMAL LOW (ref 78.0–100.0)
Monocytes Absolute: 1.1 10*3/uL — ABNORMAL HIGH (ref 0.1–1.0)
Monocytes Relative: 9 % (ref 3–12)
Neutro Abs: 10.3 10*3/uL — ABNORMAL HIGH (ref 1.7–7.7)
Neutrophils Relative %: 83 % — ABNORMAL HIGH (ref 43–77)
Platelets: 223 10*3/uL (ref 150–400)
RBC: 5.55 MIL/uL — ABNORMAL HIGH (ref 3.87–5.11)
RDW: 15.5 % (ref 11.5–15.5)
WBC: 12.4 10*3/uL — ABNORMAL HIGH (ref 4.0–10.5)

## 2014-10-28 SURGERY — APPENDECTOMY, LAPAROSCOPIC
Anesthesia: General | Site: Abdomen

## 2014-10-28 MED ORDER — FENTANYL CITRATE (PF) 100 MCG/2ML IJ SOLN
50.0000 ug | Freq: Once | INTRAMUSCULAR | Status: AC
  Administered 2014-10-28: 50 ug via INTRAVENOUS
  Filled 2014-10-28: qty 2

## 2014-10-28 MED ORDER — KETOROLAC TROMETHAMINE 15 MG/ML IJ SOLN
15.0000 mg | Freq: Four times a day (QID) | INTRAMUSCULAR | Status: AC | PRN
  Administered 2014-10-29: 15 mg via INTRAVENOUS
  Filled 2014-10-28 (×2): qty 1

## 2014-10-28 MED ORDER — SODIUM CHLORIDE 0.9 % IR SOLN
Status: DC | PRN
  Administered 2014-10-28: 3000 mL

## 2014-10-28 MED ORDER — NEOSTIGMINE METHYLSULFATE 10 MG/10ML IV SOLN
INTRAVENOUS | Status: DC | PRN
  Administered 2014-10-28: 4 mg via INTRAVENOUS

## 2014-10-28 MED ORDER — KCL IN DEXTROSE-NACL 20-5-0.9 MEQ/L-%-% IV SOLN
INTRAVENOUS | Status: DC
  Administered 2014-10-29: 01:00:00 via INTRAVENOUS
  Filled 2014-10-28 (×15): qty 1000

## 2014-10-28 MED ORDER — MORPHINE SULFATE 2 MG/ML IJ SOLN: 1.0000 mg | INTRAMUSCULAR | Status: DC | PRN

## 2014-10-28 MED ORDER — FENTANYL CITRATE (PF) 250 MCG/5ML IJ SOLN
INTRAMUSCULAR | Status: AC
  Filled 2014-10-28: qty 5

## 2014-10-28 MED ORDER — ONDANSETRON HCL 4 MG/2ML IJ SOLN
4.0000 mg | Freq: Four times a day (QID) | INTRAMUSCULAR | Status: DC | PRN
  Administered 2014-10-29 – 2014-10-31 (×3): 4 mg via INTRAVENOUS
  Filled 2014-10-28 (×4): qty 2

## 2014-10-28 MED ORDER — BUPIVACAINE-EPINEPHRINE (PF) 0.5% -1:200000 IJ SOLN
INTRAMUSCULAR | Status: AC
  Filled 2014-10-28: qty 30

## 2014-10-28 MED ORDER — PIPERACILLIN-TAZOBACTAM 3.375 G IVPB
3.3750 g | Freq: Three times a day (TID) | INTRAVENOUS | Status: DC
  Administered 2014-10-29 – 2014-11-04 (×20): 3.375 g via INTRAVENOUS
  Filled 2014-10-28 (×22): qty 50

## 2014-10-28 MED ORDER — PIPERACILLIN-TAZOBACTAM 3.375 G IVPB 30 MIN
3.3750 g | Freq: Once | INTRAVENOUS | Status: AC
  Administered 2014-10-28: 3.375 g via INTRAVENOUS
  Filled 2014-10-28: qty 50

## 2014-10-28 MED ORDER — FENTANYL CITRATE (PF) 100 MCG/2ML IJ SOLN
INTRAMUSCULAR | Status: DC | PRN
Start: 2014-10-28 — End: 2014-10-28
  Administered 2014-10-28: 100 ug via INTRAVENOUS
  Administered 2014-10-28 (×3): 50 ug via INTRAVENOUS

## 2014-10-28 MED ORDER — ENOXAPARIN SODIUM 40 MG/0.4ML ~~LOC~~ SOLN
40.0000 mg | SUBCUTANEOUS | Status: DC
  Administered 2014-10-29 – 2014-11-03 (×6): 40 mg via SUBCUTANEOUS
  Filled 2014-10-28 (×5): qty 0.4

## 2014-10-28 MED ORDER — ROCURONIUM BROMIDE 100 MG/10ML IV SOLN
INTRAVENOUS | Status: DC | PRN
  Administered 2014-10-28: 40 mg via INTRAVENOUS

## 2014-10-28 MED ORDER — ONDANSETRON HCL 4 MG PO TABS: 4.0000 mg | ORAL_TABLET | Freq: Four times a day (QID) | ORAL | Status: DC | PRN

## 2014-10-28 MED ORDER — GLYCOPYRROLATE 0.2 MG/ML IJ SOLN
INTRAMUSCULAR | Status: DC | PRN
  Administered 2014-10-28: 0.6 mg via INTRAVENOUS

## 2014-10-28 MED ORDER — MEPERIDINE HCL 25 MG/ML IJ SOLN: 6.2500 mg | INTRAMUSCULAR | Status: DC | PRN

## 2014-10-28 MED ORDER — IOHEXOL 300 MG/ML  SOLN
25.0000 mL | Freq: Once | INTRAMUSCULAR | Status: AC | PRN
  Administered 2014-10-28: 25 mL via ORAL

## 2014-10-28 MED ORDER — 0.9 % SODIUM CHLORIDE (POUR BTL) OPTIME
TOPICAL | Status: DC | PRN
  Administered 2014-10-28: 1000 mL

## 2014-10-28 MED ORDER — ONDANSETRON HCL 4 MG/2ML IJ SOLN
INTRAMUSCULAR | Status: DC | PRN
  Administered 2014-10-28: 4 mg via INTRAVENOUS

## 2014-10-28 MED ORDER — DEXAMETHASONE SODIUM PHOSPHATE 4 MG/ML IJ SOLN
INTRAMUSCULAR | Status: DC | PRN
  Administered 2014-10-28: 4 mg via INTRAVENOUS

## 2014-10-28 MED ORDER — GLYCOPYRROLATE 0.2 MG/ML IJ SOLN
INTRAMUSCULAR | Status: AC
  Filled 2014-10-28: qty 3

## 2014-10-28 MED ORDER — LIDOCAINE HCL (CARDIAC) 20 MG/ML IV SOLN
INTRAVENOUS | Status: DC | PRN
  Administered 2014-10-28: 60 mg via INTRAVENOUS

## 2014-10-28 MED ORDER — LACTATED RINGERS IV SOLN
INTRAVENOUS | Status: DC | PRN
  Administered 2014-10-28 (×2): via INTRAVENOUS

## 2014-10-28 MED ORDER — BUPIVACAINE-EPINEPHRINE 0.5% -1:200000 IJ SOLN
INTRAMUSCULAR | Status: DC | PRN
  Administered 2014-10-28: 5 mL

## 2014-10-28 MED ORDER — AMLODIPINE BESYLATE 10 MG PO TABS
10.0000 mg | ORAL_TABLET | Freq: Every day | ORAL | Status: DC
Start: 2014-10-29 — End: 2014-11-08
  Administered 2014-10-29 – 2014-11-08 (×11): 10 mg via ORAL
  Filled 2014-10-28 (×11): qty 1

## 2014-10-28 MED ORDER — ONDANSETRON HCL 4 MG/2ML IJ SOLN: 4.0000 mg | INTRAMUSCULAR | Status: DC | PRN

## 2014-10-28 MED ORDER — ACETAMINOPHEN 500 MG PO TABS
1000.0000 mg | ORAL_TABLET | Freq: Four times a day (QID) | ORAL | Status: AC
  Administered 2014-10-29 (×4): 1000 mg via ORAL
  Filled 2014-10-28 (×4): qty 2

## 2014-10-28 MED ORDER — HEMOSTATIC AGENTS (NO CHARGE) OPTIME
TOPICAL | Status: DC | PRN
  Administered 2014-10-28: 1 via TOPICAL

## 2014-10-28 MED ORDER — NEOSTIGMINE METHYLSULFATE 10 MG/10ML IV SOLN
INTRAVENOUS | Status: AC
  Filled 2014-10-28: qty 1

## 2014-10-28 MED ORDER — HYDROMORPHONE HCL 1 MG/ML IJ SOLN: 0.2500 mg | INTRAMUSCULAR | Status: DC | PRN

## 2014-10-28 MED ORDER — LACTATED RINGERS IV SOLN
INTRAVENOUS | Status: DC
  Administered 2014-10-28: 18:00:00 via INTRAVENOUS

## 2014-10-28 MED ORDER — ONDANSETRON HCL 4 MG/2ML IJ SOLN
INTRAMUSCULAR | Status: AC
  Filled 2014-10-28: qty 2

## 2014-10-28 MED ORDER — PROPOFOL 10 MG/ML IV BOLUS
INTRAVENOUS | Status: DC | PRN
  Administered 2014-10-28: 110 mg via INTRAVENOUS

## 2014-10-28 MED ORDER — OXYCODONE-ACETAMINOPHEN 5-325 MG PO TABS
1.0000 | ORAL_TABLET | ORAL | Status: DC | PRN
  Administered 2014-10-30 – 2014-11-08 (×15): 2 via ORAL
  Filled 2014-10-28 (×16): qty 2

## 2014-10-28 MED ORDER — IOHEXOL 300 MG/ML  SOLN
100.0000 mL | Freq: Once | INTRAMUSCULAR | Status: AC | PRN
  Administered 2014-10-28: 100 mL via INTRAVENOUS

## 2014-10-28 MED ORDER — HYDROMORPHONE HCL 1 MG/ML IJ SOLN: 1.0000 mg | INTRAMUSCULAR | Status: DC | PRN

## 2014-10-28 MED ORDER — SUCCINYLCHOLINE CHLORIDE 20 MG/ML IJ SOLN
INTRAMUSCULAR | Status: DC | PRN
  Administered 2014-10-28: 100 mg via INTRAVENOUS

## 2014-10-28 SURGICAL SUPPLY — 45 items
APPLIER CLIP ROT 10 11.4 M/L (STAPLE)
BLADE SURG ROTATE 9660 (MISCELLANEOUS) ×3 IMPLANT
CANISTER SUCTION 2500CC (MISCELLANEOUS) ×3 IMPLANT
CHLORAPREP W/TINT 26ML (MISCELLANEOUS) ×3 IMPLANT
CLIP APPLIE ROT 10 11.4 M/L (STAPLE) IMPLANT
COVER SURGICAL LIGHT HANDLE (MISCELLANEOUS) ×3 IMPLANT
CUTTER FLEX LINEAR 45M (STAPLE) ×3 IMPLANT
DRAPE LAPAROSCOPIC ABDOMINAL (DRAPES) ×3 IMPLANT
DRAPE WARM FLUID 44X44 (DRAPE) ×3 IMPLANT
ELECT REM PT RETURN 9FT ADLT (ELECTROSURGICAL) ×3
ELECTRODE REM PT RTRN 9FT ADLT (ELECTROSURGICAL) ×1 IMPLANT
ENDOLOOP SUT PDS II  0 18 (SUTURE)
ENDOLOOP SUT PDS II 0 18 (SUTURE) IMPLANT
GLOVE BIO SURGEON STRL SZ8 (GLOVE) IMPLANT
GLOVE BIOGEL PI IND STRL 8 (GLOVE) ×1 IMPLANT
GLOVE BIOGEL PI INDICATOR 8 (GLOVE) ×2
GLOVE SKINSENSE NS SZ7.0 (GLOVE) ×2
GLOVE SKINSENSE NS SZ7.5 (GLOVE) ×2
GLOVE SKINSENSE NS SZ8.0 LF (GLOVE) ×2
GLOVE SKINSENSE STRL SZ7.0 (GLOVE) ×1 IMPLANT
GLOVE SKINSENSE STRL SZ7.5 (GLOVE) ×1 IMPLANT
GLOVE SKINSENSE STRL SZ8.0 LF (GLOVE) ×1 IMPLANT
GOWN STRL REUS W/ TWL LRG LVL3 (GOWN DISPOSABLE) ×2 IMPLANT
GOWN STRL REUS W/ TWL XL LVL3 (GOWN DISPOSABLE) ×1 IMPLANT
GOWN STRL REUS W/TWL LRG LVL3 (GOWN DISPOSABLE) ×4
GOWN STRL REUS W/TWL XL LVL3 (GOWN DISPOSABLE) ×2
HEMOSTAT SNOW SURGICEL 2X4 (HEMOSTASIS) ×3 IMPLANT
KIT BASIN OR (CUSTOM PROCEDURE TRAY) ×3 IMPLANT
KIT ROOM TURNOVER OR (KITS) ×3 IMPLANT
LIQUID BAND (GAUZE/BANDAGES/DRESSINGS) ×3 IMPLANT
NS IRRIG 1000ML POUR BTL (IV SOLUTION) ×3 IMPLANT
PAD ARMBOARD 7.5X6 YLW CONV (MISCELLANEOUS) ×6 IMPLANT
POUCH SPECIMEN RETRIEVAL 10MM (ENDOMECHANICALS) ×3 IMPLANT
RELOAD STAPLE TA45 3.5 REG BLU (ENDOMECHANICALS) ×3 IMPLANT
SCALPEL HARMONIC ACE (MISCELLANEOUS) ×3 IMPLANT
SET IRRIG TUBING LAPAROSCOPIC (IRRIGATION / IRRIGATOR) ×3 IMPLANT
SPECIMEN JAR SMALL (MISCELLANEOUS) ×3 IMPLANT
SUT MON AB 4-0 PC3 18 (SUTURE) ×3 IMPLANT
TOWEL OR 17X24 6PK STRL BLUE (TOWEL DISPOSABLE) ×3 IMPLANT
TOWEL OR 17X26 10 PK STRL BLUE (TOWEL DISPOSABLE) ×3 IMPLANT
TRAY FOLEY CATH 16FR SILVER (SET/KITS/TRAYS/PACK) IMPLANT
TRAY LAPAROSCOPIC (CUSTOM PROCEDURE TRAY) ×3 IMPLANT
TROCAR XCEL BLADELESS 5X75MML (TROCAR) ×6 IMPLANT
TROCAR XCEL BLUNT TIP 100MML (ENDOMECHANICALS) ×3 IMPLANT
TUBING INSUFFLATION (TUBING) ×3 IMPLANT

## 2014-10-28 NOTE — Transfer of Care (Signed)
Immediate Anesthesia Transfer of Care Note  Patient: Andrea Keller  Procedure(s) Performed: Procedure(s): APPENDECTOMY LAPAROSCOPIC (N/A)  Patient Location: PACU  Anesthesia Type:General  Level of Consciousness: awake, alert  and oriented  Airway & Oxygen Therapy: Patient Spontanous Breathing and Patient connected to face mask oxygen  Post-op Assessment: Report given to RN, Post -op Vital signs reviewed and stable and Patient moving all extremities  Post vital signs: Reviewed and stable  Last Vitals:  Filed Vitals:   10/28/14 1630  BP: 111/59  Pulse: 90  Temp:   Resp: 25    Complications: No apparent anesthesia complications

## 2014-10-28 NOTE — Op Note (Signed)
Appendectomy, Lap, Procedure Note  Indications: The patient presented with a history of right-sided abdominal pain. A CT  revealed findings consistent with acute appendicitis.The procedure has been discussed with the patient.  Alternative therapies have been discussed with the patient.  Operative risks include bleeding,  Infection,  Organ injury,  Nerve injury,  Blood vessel injury,  DVT,  Pulmonary embolism,  Death,  And possible reoperation.  Medical management risks include worsening of present situation.  The success of the procedure is 50 -90 % at treating patients symptoms.  The patient understands and agrees to proceed.  Pre-operative Diagnosis: Acute appendicitis with generalized peritonitis  Post-operative Diagnosis: Acute appendicitis with generalized peritonitis and perforation  Surgeon: Erroll Luna A.   Assistants: OR staff  Anesthesia: General endotracheal anesthesia and Local anesthesia 0.25.% bupivacaine, with epinephrine  ASA Class: 3  Procedure Details  The patient was seen again in the Holding Room. The risks, benefits, complications, treatment options, and expected outcomes were discussed with the patient and/or family. The possibilities of reaction to medication, pulmonary aspiration, perforation of viscus, bleeding, recurrent infection, finding a normal appendix, the need for additional procedures, failure to diagnose a condition, and creating a complication requiring transfusion or operation were discussed. There was concurrence with the proposed plan and informed consent was obtained. The site of surgery was properly noted/marked. The patient was taken to Operating Room, identified as Andrea Keller and the procedure verified as Appendectomy. A Time Out was held and the above information confirmed.  The patient was placed in the supine position and general anesthesia was induced, along with placement of orogastric tube, Venodyne boots, and a Foley catheter. The abdomen  was prepped and draped in a sterile fashion. A one centimeter infraumbilical incision was made and the peritoneal cavity was accessed using the OPEN  technique. The pneumoperitoneum was then established to steady pressure of 12 mmHg. A 12 mm port was placed through the umbilical incision. Additional 5 mm cannulas then placed in the left lower quadrant of the abdomen and right upper quadrant under direct vision. A careful evaluation of the entire abdomen was carried out. The patient was placed in Trendelenburg and left lateral decubitus position. The small intestines were retracted in the cephalad and left lateral direction away from the pelvis and right lower quadrant. The patient was found to have an enlarged and inflamed appendix that was extending into the pelvis. There was  evidence of perforation and diffuse peritonitis.   The appendix was carefully dissected. A window was made in the mesoappendix at the base of the appendix. A harmonic scalpel was used across the mesoappendix. The appendix was divided at its base using an endo-GIA stapler. Minimal appendiceal stump was left in place. There was no evidence of bleeding, leakage, or complication after division of the appendix. Irrigation was also performed and irrigate suctioned from the abdomen as well. Small piece of Surgicel was placed at the base of the mesoappendix.   The umbilical port site was closed using 0 vicryl pursestring sutures fashion at the level of the fascia. The trocar site skin wounds were closed using skin staples.  Instrument, sponge, and needle counts were correct at the conclusion of the case.   Findings: The appendix was found to be inflamed. There were signs of necrosis.  There was perforation. There was abscess formation.  Estimated Blood Loss:  less than 100 mL         Drains: none         Total  IV Fluids: 800 mL         Specimens: apendix         Complications:  None; patient tolerated the procedure well.          Disposition: PACU - hemodynamically stable.         Condition: stable

## 2014-10-28 NOTE — Anesthesia Procedure Notes (Signed)
Procedure Name: Intubation Date/Time: 10/28/2014 6:55 PM Performed by: Shirlyn Goltz Pre-anesthesia Checklist: Patient identified, Timeout performed, Emergency Drugs available, Suction available and Patient being monitored Patient Re-evaluated:Patient Re-evaluated prior to inductionOxygen Delivery Method: Circle system utilized Preoxygenation: Pre-oxygenation with 100% oxygen Intubation Type: IV induction Ventilation: Mask ventilation without difficulty Laryngoscope Size: Mac and 3 Grade View: Grade I Tube type: Oral Tube size: 7.0 mm Number of attempts: 1 Airway Equipment and Method: Stylet Placement Confirmation: ETT inserted through vocal cords under direct vision,  breath sounds checked- equal and bilateral and positive ETCO2 Secured at: 21 cm Tube secured with: Tape Dental Injury: Teeth and Oropharynx as per pre-operative assessment

## 2014-10-28 NOTE — Anesthesia Postprocedure Evaluation (Signed)
  Anesthesia Post-op Note  Patient: Andrea Keller  Procedure(s) Performed: Procedure(s): APPENDECTOMY LAPAROSCOPIC (N/A)  Patient Location: PACU  Anesthesia Type:General  Level of Consciousness: awake and alert   Airway and Oxygen Therapy: Patient Spontanous Breathing  Post-op Pain: mild  Post-op Assessment: Post-op Vital signs reviewed  Post-op Vital Signs: Reviewed  Last Vitals:  Filed Vitals:   10/28/14 2120  BP:   Pulse: 77  Temp:   Resp: 16    Complications: No apparent anesthesia complications

## 2014-10-28 NOTE — H&P (Signed)
Andrea Keller Oct 14, 1945  [id number].   Chief Complaint/Reason for Consult: abdominal pain HPI: This is a 69 year old black female with a history of hypertension and peripheral vascular disease who presented to the emergency department with complaints of "something coming out of her vagina" and abdominal pain. The patient began having abdominal pain 2 mornings ago. She has had nausea and vomiting as well as some diarrhea. She denies any fevers but admits to chills. Her pain has been diffuse but is greatest in her right lower quadrant. She presented to the emergency department today due to pain. Upon arrival she was noted to have a large cystocele. This was reduced by the emergency department physician. A Foley catheter was then placed. Due to persistent pain, a CT scan of abdomen and pelvis was ordered. This revealed acute appendicitis with extensive inflammatory change in the right lower quadrant and a small volume of perihepatic and free pelvic fluid.  We have been asked to see for admission.  ROS : Please see HPI, otherwise negative  Family History  Problem Relation Age of Onset  . Colon cancer Brother   . Diabetes Mother   . Lung cancer Father   . Alcohol abuse      Past Medical History  Diagnosis Date  . Diverticulosis   . Hyperplastic colon polyp   . Insomnia   . Microcytosis   . Osteoarthritis   . PVD (peripheral vascular disease)   . Multiple thyroid nodules   . Hypertension     Past Surgical History  Procedure Laterality Date  . Cyst removal neck    . Abdominal hysterectomy  2009    Social History:  reports that she has quit smoking. She has never used smokeless tobacco. She reports that she does not drink alcohol or use illicit drugs.  Allergies: No Known Allergies   (Not in a hospital admission)  Blood pressure 120/61, pulse 89, temperature 98.1 F (36.7 C), temperature source Oral, resp. rate 30, height 5\' 3"  (1.6 m), weight 74.844 kg (165 lb), SpO2 95  %. Physical Exam: General: pleasant, WD, WN black female who is laying in bed in NAD HEENT: head is normocephalic, atraumatic.  Sclera are noninjected.  PERRL.  Ears and nose without any masses or lesions.  Mouth is pink and moist Heart: regular, rate, and rhythm.  Normal s1,s2. No obvious murmurs, gallops, or rubs noted.  Palpable radial and pedal pulses bilaterally Lungs: CTAB, no wheezes, rhonchi, or rales noted.  Respiratory effort nonlabored Abd: soft, diffusely tender, but focally greatest in the right lower quadrant at McBurney's point, ND, hypoactive BS, no masses, hernias, or organomegaly GU: foley in place, cystocele has been reduced MS: all 4 extremities are symmetrical with no cyanosis, clubbing, or edema. Skin: warm and dry with no masses, lesions, or rashes Psych: A&Ox3 with an appropriate affect.    Results for orders placed or performed during the hospital encounter of 10/28/14 (from the past 48 hour(s))  Urinalysis, Routine w reflex microscopic     Status: Abnormal   Collection Time: 10/28/14  8:30 AM  Result Value Ref Range   Color, Urine YELLOW YELLOW   APPearance HAZY (A) CLEAR   Specific Gravity, Urine 1.021 1.005 - 1.030   pH 5.5 5.0 - 8.0   Glucose, UA NEGATIVE NEGATIVE mg/dL   Hgb urine dipstick NEGATIVE NEGATIVE   Bilirubin Urine NEGATIVE NEGATIVE   Ketones, ur 15 (A) NEGATIVE mg/dL   Protein, ur NEGATIVE NEGATIVE mg/dL   Urobilinogen, UA 0.2 0.0 -  1.0 mg/dL   Nitrite NEGATIVE NEGATIVE   Leukocytes, UA NEGATIVE NEGATIVE    Comment: MICROSCOPIC NOT DONE ON URINES WITH NEGATIVE PROTEIN, BLOOD, LEUKOCYTES, NITRITE, OR GLUCOSE <1000 mg/dL.  CBC with Differential     Status: Abnormal   Collection Time: 10/28/14  8:46 AM  Result Value Ref Range   WBC 12.4 (H) 4.0 - 10.5 K/uL   RBC 5.55 (H) 3.87 - 5.11 MIL/uL   Hemoglobin 12.8 12.0 - 15.0 g/dL   HCT 39.7 36.0 - 46.0 %   MCV 71.5 (L) 78.0 - 100.0 fL   MCH 23.1 (L) 26.0 - 34.0 pg   MCHC 32.2 30.0 - 36.0 g/dL    RDW 15.5 11.5 - 15.5 %   Platelets 223 150 - 400 K/uL   Neutrophils Relative % 83 (H) 43 - 77 %   Neutro Abs 10.3 (H) 1.7 - 7.7 K/uL   Lymphocytes Relative 8 (L) 12 - 46 %   Lymphs Abs 1.0 0.7 - 4.0 K/uL   Monocytes Relative 9 3 - 12 %   Monocytes Absolute 1.1 (H) 0.1 - 1.0 K/uL   Eosinophils Relative 0 0 - 5 %   Eosinophils Absolute 0.0 0.0 - 0.7 K/uL   Basophils Relative 0 0 - 1 %   Basophils Absolute 0.0 0.0 - 0.1 K/uL  I-stat Chem 8, ED     Status: Abnormal   Collection Time: 10/28/14  8:56 AM  Result Value Ref Range   Sodium 138 135 - 145 mmol/L   Potassium 4.6 3.5 - 5.1 mmol/L   Chloride 105 96 - 112 mmol/L   BUN 16 6 - 23 mg/dL   Creatinine, Ser 1.10 0.50 - 1.10 mg/dL   Glucose, Bld 128 (H) 70 - 99 mg/dL   Calcium, Ion 1.11 (L) 1.13 - 1.30 mmol/L   TCO2 19 0 - 100 mmol/L   Hemoglobin 15.0 12.0 - 15.0 g/dL   HCT 44.0 36.0 - 46.0 %   US Transvaginal Non-ob  10/28/2014   CLINICAL DATA:  Abdominal and vaginal pain  EXAM: TRANSABDOMINAL AND TRANSVAGINAL ULTRASOUND OF PELVIS  TECHNIQUE: Both transabdominal and transvaginal ultrasound examinations of the pelvis were performed. Transabdominal technique was performed for global imaging of the pelvis including uterus, ovaries, adnexal regions, and pelvic cul-de-sac. It was necessary to proceed with endovaginal exam following the transabdominal exam to visualize the adnexa.  COMPARISON:  09/17/2006  FINDINGS: Surgically absent uterus.  Negative vaginal cuff.  The ovaries, if remaining, cannot be visualized confidently. Thick walled tubular structure in the right lower quadrant, with small localized ascites (that is complex due to internal echoes) could reflect thick small bowel or appendix thickening.  IMPRESSION: 1. Suspect bowel thickening/inflammation in the right lower quadrant, where there is also small mildly complex ascites. Suggest contrast-enhanced abdominal CT in this patient with leukocytosis. 2. Hysterectomy. The ovaries, if  remaining, could not be identified.   Electronically Signed   By: Monte Fantasia M.D.   On: 10/28/2014 12:28   US Pelvis Complete  10/28/2014   CLINICAL DATA:  Abdominal and vaginal pain  EXAM: TRANSABDOMINAL AND TRANSVAGINAL ULTRASOUND OF PELVIS  TECHNIQUE: Both transabdominal and transvaginal ultrasound examinations of the pelvis were performed. Transabdominal technique was performed for global imaging of the pelvis including uterus, ovaries, adnexal regions, and pelvic cul-de-sac. It was necessary to proceed with endovaginal exam following the transabdominal exam to visualize the adnexa.  COMPARISON:  09/17/2006  FINDINGS: Surgically absent uterus.  Negative vaginal cuff.  The ovaries, if  remaining, cannot be visualized confidently. Thick walled tubular structure in the right lower quadrant, with small localized ascites (that is complex due to internal echoes) could reflect thick small bowel or appendix thickening.  IMPRESSION: 1. Suspect bowel thickening/inflammation in the right lower quadrant, where there is also small mildly complex ascites. Suggest contrast-enhanced abdominal CT in this patient with leukocytosis. 2. Hysterectomy. The ovaries, if remaining, could not be identified.   Electronically Signed   By: Monte Fantasia M.D.   On: 10/28/2014 12:28   Ct Abdomen Pelvis W Contrast  10/28/2014   CLINICAL DATA:  Abdominal and vaginal pain beginning 2 days ago.  EXAM: CT ABDOMEN AND PELVIS WITH CONTRAST  TECHNIQUE: Multidetector CT imaging of the abdomen and pelvis was performed using the standard protocol following bolus administration of intravenous contrast.  CONTRAST:  161mL OMNIPAQUE IOHEXOL 300 MG/ML  SOLN  COMPARISON:  None.  FINDINGS: Dependent atelectatic change is seen in the lung bases, worse on the right. A small amount pericardial fluid is identified and there are trace bilateral pleural effusions. Heart size is mildly enlarged. A small hiatal hernia is noted.  The appendix is markedly  dilated at 2.0 cm with a small appendicolith identified. There is extensive stranding in the right lower quadrant of the abdomen and about small bowel loops in the right lower quadrant. Scattered colonic diverticula are seen but no evidence of diverticulitis is identified. Liquid stool is noted in the ascending colon.  A 1.8 cm hepatic cyst is noted on image 18. The liver is otherwise unremarkable. The gallbladder, spleen, adrenal glands, pancreas and kidneys appear normal.  Scattered aortoiliac atherosclerosis without aneurysm is noted. There is a small volume of free pelvic and perihepatic fluid. No lymphadenopathy is identified. No lytic or sclerotic bony lesion is seen.  IMPRESSION: The study is positive for acute appendicitis with extensive inflammatory change in the right lower quadrant and a small volume of perihepatic and free pelvic fluid. Findings called to Dr. Malvin Johns at the time of interpretation.  Diverticulosis without diverticulitis.  Small hiatal hernia.  Aortoiliac atherosclerosis without aneurysm.   Electronically Signed   By: Inge Rise M.D.   On: 10/28/2014 15:45       Assessment/Plan 1. Acute appendicitis 2. Hypertension 3. Cystocele, reduced with Foley in place  Plan: The patient will be admitted to the surgical service. We will plan on surgical intervention with a laparoscopic appendectomy tonight. The patient does have extensive inflammatory changes along with free fluid. There is no obvious sign of perforation on her CT scan but obviously this is concerning given she has had at least 48 hours of symptoms. She has artery been started on Zosyn. She will be kept nothing by mouth. The procedure has been explained to the patient. She is agreeable to proceed. We will continue her Foley in place for now given her recent reduction of her cystocele and surgical intervention.  Ashlan Dignan E 10/28/2014, 4:35 PM Pager: [phone]

## 2014-10-28 NOTE — ED Notes (Signed)
CT informed pt has finished contrast 

## 2014-10-28 NOTE — ED Provider Notes (Signed)
CSN: [id number]     Arrival date & time 10/28/14  4403 History   First MD Initiated Contact with Patient 10/28/14 0701     Chief Complaint  Patient presents with  . Abdominal Pain  . Vaginal Pain     (Consider location/radiation/quality/duration/timing/severity/associated sxs/prior Treatment) HPI Comments: Patient presents with pain across her lower abdomen and vaginal area. She states it started about 2 days ago and she feels a bulge in her vaginal area. She states that she had a complete hysterectomy about 4-5 years ago. She states that the pain is associated with some vomiting. She also is having some difficulty urinating this morning. She had a normal bowel movement yesterday but has not had a bowel movement today. She has her pain is constant and feels like a burning tearing type pain. She's not taken anything at home for the pain.   Past Medical History  Diagnosis Date  . Diverticulosis   . Hyperplastic colon polyp   . Insomnia   . Microcytosis   . Osteoarthritis   . PVD (peripheral vascular disease)   . Multiple thyroid nodules   . Hypertension    Past Surgical History  Procedure Laterality Date  . Cyst removal neck    . Abdominal hysterectomy  2009  . Laparoscopic appendectomy N/A 10/28/2014    Procedure: APPENDECTOMY LAPAROSCOPIC;  Surgeon: Erroll Luna, MD;  Location: [facility] OR;  Service: General;  Laterality: N/A;   Family History  Problem Relation Age of Onset  . Colon cancer Brother   . Diabetes Mother   . Lung cancer Father   . Alcohol abuse     History  Substance Use Topics  . Smoking status: Former Research scientist (life sciences)  . Smokeless tobacco: Never Used  . Alcohol Use: No   OB History    No data available     Review of Systems  Constitutional: Negative for fever, chills, diaphoresis and fatigue.  HENT: Negative for congestion, rhinorrhea and sneezing.   Eyes: Negative.   Respiratory: Negative for cough, chest tightness and shortness of breath.   Cardiovascular:  Negative for chest pain and leg swelling.  Gastrointestinal: Positive for nausea, vomiting and abdominal pain. Negative for diarrhea and blood in stool.  Genitourinary: Positive for dysuria and vaginal pain. Negative for frequency, hematuria, flank pain, vaginal bleeding, vaginal discharge and difficulty urinating.  Musculoskeletal: Negative for back pain and arthralgias.  Skin: Negative for rash.  Neurological: Negative for dizziness, speech difficulty, weakness, numbness and headaches.      Allergies  Latex  Home Medications   Prior to Admission medications   Medication Sig Start Date End Date Taking? Authorizing Provider  amLODipine (NORVASC) 10 MG tablet Take 10 mg by mouth daily.   Yes Historical Provider, MD  estradiol (ESTRACE) 0.1 MG/GM vaginal cream Place 1 Applicatorful vaginally 3 (three) times a week.   Yes Historical Provider, MD  ferrous sulfate 325 (65 FE) MG tablet Take 325 mg by mouth daily with breakfast.   Yes Historical Provider, MD  rosuvastatin (CRESTOR) 10 MG tablet Take 10 mg by mouth daily.   Yes Historical Provider, MD   BP 108/56 mmHg  Pulse 43  Temp(Src) 98.5 F (36.9 C) (Oral)  Resp 16  Ht 5\' 3"  (1.6 m)  Wt 165 lb (74.844 kg)  BMI 29.24 kg/m2  SpO2 97% Physical Exam  Constitutional: She is oriented to person, place, and time. She appears well-developed and well-nourished.  HENT:  Head: Normocephalic and atraumatic.  Eyes: Pupils are equal, round, and  reactive to light.  Neck: Normal range of motion. Neck supple.  Cardiovascular: Normal rate, regular rhythm and normal heart sounds.   Pulmonary/Chest: Effort normal and breath sounds normal. No respiratory distress. She has no wheezes. She has no rales. She exhibits no tenderness.  Abdominal: Soft. Bowel sounds are normal. There is tenderness (tenderness across the suprapubic area and lower abdomen). There is no rebound and no guarding.  Genitourinary:  +large pink bulge protruding from the vagina,  soft, presumably a cystocele  Musculoskeletal: Normal range of motion. She exhibits no edema.  Lymphadenopathy:    She has no cervical adenopathy.  Neurological: She is alert and oriented to person, place, and time.  Skin: Skin is warm and dry. No rash noted.  Psychiatric: She has a normal mood and affect.    ED Course  Procedures (including critical care time) Labs Review Labs Reviewed  CBC WITH DIFFERENTIAL/PLATELET - Abnormal; Notable for the following:    WBC 12.4 (*)    RBC 5.55 (*)    MCV 71.5 (*)    MCH 23.1 (*)    Neutrophils Relative % 83 (*)    Neutro Abs 10.3 (*)    Lymphocytes Relative 8 (*)    Monocytes Absolute 1.1 (*)    All other components within normal limits  URINALYSIS, ROUTINE W REFLEX MICROSCOPIC - Abnormal; Notable for the following:    APPearance HAZY (*)    Ketones, ur 15 (*)    All other components within normal limits  CBC - Abnormal; Notable for the following:    WBC 16.4 (*)    RBC 5.18 (*)    Hemoglobin 11.9 (*)    MCV 71.8 (*)    MCH 23.0 (*)    RDW 15.7 (*)    All other components within normal limits  COMPREHENSIVE METABOLIC PANEL - Abnormal; Notable for the following:    Glucose, Bld 123 (*)    Creatinine, Ser 1.36 (*)    Calcium 8.0 (*)    Albumin 2.8 (*)    GFR calc non Af Amer 38 (*)    GFR calc Af Amer 45 (*)    All other components within normal limits  CBC - Abnormal; Notable for the following:    WBC 16.3 (*)    RBC 5.35 (*)    MCV 72.0 (*)    MCH 22.6 (*)    RDW 15.7 (*)    All other components within normal limits  I-STAT CHEM 8, ED - Abnormal; Notable for the following:    Glucose, Bld 128 (*)    Calcium, Ion 1.11 (*)    All other components within normal limits  URINE CULTURE  SURGICAL PATHOLOGY    Imaging Review No results found.   EKG Interpretation None      MDM   Final diagnoses:  Acute appendicitis with localized peritonitis  Cystocele    Patient presented with a large protrusion from her  abdomen that was consistent with a probable cystocele. She was having pain to the area. Initially I felt that this could be from urinary retention and a Foley catheter was placed in her bladder was drained. The cystocele was reduced by me. She was given dose of fentanyl. Her pain improved. However I did do a pelvic ultrasound which showed normal pelvic anatomy however there was some inflammation of her intestines and the CT was recommended. CT scan showed evidence of acute appendicitis. Patient is given dose of antibiotics and I consult to surgery who admitted  her for surgical removal of her appendix.    Malvin Johns, MD 10/31/14 [phone]

## 2014-10-28 NOTE — Anesthesia Preprocedure Evaluation (Signed)
Anesthesia Evaluation  Patient identified by MRN, date of birth, ID band Patient awake    Reviewed: Allergy & Precautions, NPO status , Patient's Chart, lab work & pertinent test results  Airway Mallampati: I  TM Distance: >3 FB Neck ROM: Full    Dental  (+) Teeth Intact, Dental Advisory Given   Pulmonary former smoker,  breath sounds clear to auscultation        Cardiovascular hypertension, Pt. on medications + Peripheral Vascular Disease Rhythm:Regular Rate:Normal     Neuro/Psych    GI/Hepatic   Endo/Other    Renal/GU      Musculoskeletal   Abdominal   Peds  Hematology   Anesthesia Other Findings Pt has been vomiting  Reproductive/Obstetrics                             Anesthesia Physical Anesthesia Plan  ASA: II and emergent  Anesthesia Plan: General   Post-op Pain Management:    Induction: Intravenous, Rapid sequence and Cricoid pressure planned  Airway Management Planned: Oral ETT  Additional Equipment:   Intra-op Plan:   Post-operative Plan: Extubation in OR  Informed Consent: I have reviewed the patients History and Physical, chart, labs and discussed the procedure including the risks, benefits and alternatives for the proposed anesthesia with the patient or authorized representative who has indicated his/her understanding and acceptance.   Dental advisory given  Plan Discussed with: Anesthesiologist, CRNA and Surgeon  Anesthesia Plan Comments:         Anesthesia Quick Evaluation

## 2014-10-28 NOTE — ED Notes (Signed)
Pt. Started having abdominal/vaginal pain 2 days ago. Pt describes it as a tearing pain in both areas. Pt. States her intestines are coming out her vagina. Pt has had a hysterectomy in the past

## 2014-10-29 ENCOUNTER — Encounter (HOSPITAL_COMMUNITY): Payer: Self-pay | Admitting: Surgery

## 2014-10-29 LAB — CBC
HCT: 37.2 % (ref 36.0–46.0)
Hemoglobin: 11.9 g/dL — ABNORMAL LOW (ref 12.0–15.0)
MCH: 23 pg — ABNORMAL LOW (ref 26.0–34.0)
MCHC: 32 g/dL (ref 30.0–36.0)
MCV: 71.8 fL — AB (ref 78.0–100.0)
Platelets: 227 10*3/uL (ref 150–400)
RBC: 5.18 MIL/uL — AB (ref 3.87–5.11)
RDW: 15.7 % — ABNORMAL HIGH (ref 11.5–15.5)
WBC: 16.4 10*3/uL — AB (ref 4.0–10.5)

## 2014-10-29 LAB — COMPREHENSIVE METABOLIC PANEL
ALT: 9 U/L (ref 0–35)
AST: 12 U/L (ref 0–37)
Albumin: 2.8 g/dL — ABNORMAL LOW (ref 3.5–5.2)
Alkaline Phosphatase: 61 U/L (ref 39–117)
Anion gap: 11 (ref 5–15)
BUN: 17 mg/dL (ref 6–23)
CO2: 21 mmol/L (ref 19–32)
Calcium: 8 mg/dL — ABNORMAL LOW (ref 8.4–10.5)
Chloride: 104 mmol/L (ref 96–112)
Creatinine, Ser: 1.36 mg/dL — ABNORMAL HIGH (ref 0.50–1.10)
GFR calc Af Amer: 45 mL/min — ABNORMAL LOW (ref >90)
GFR calc non Af Amer: 38 mL/min — ABNORMAL LOW (ref >90)
Glucose, Bld: 123 mg/dL — ABNORMAL HIGH (ref 70–99)
Potassium: 4.4 mmol/L (ref 3.5–5.1)
Sodium: 136 mmol/L (ref 135–145)
Total Bilirubin: 0.6 mg/dL (ref 0.3–1.2)
Total Protein: 6.8 g/dL (ref 6.0–8.3)

## 2014-10-29 MED ORDER — DIPHENHYDRAMINE HCL 50 MG/ML IJ SOLN
INTRAMUSCULAR | Status: AC
  Filled 2014-10-29: qty 1

## 2014-10-29 MED ORDER — NEOSTIGMINE METHYLSULFATE 10 MG/10ML IV SOLN
INTRAVENOUS | Status: AC
  Filled 2014-10-29: qty 1

## 2014-10-29 MED ORDER — GLYCOPYRROLATE 0.2 MG/ML IJ SOLN
INTRAMUSCULAR | Status: AC
  Filled 2014-10-29: qty 3

## 2014-10-29 MED ORDER — DEXAMETHASONE SODIUM PHOSPHATE 4 MG/ML IJ SOLN
INTRAMUSCULAR | Status: AC
  Filled 2014-10-29: qty 1

## 2014-10-29 MED ORDER — ONDANSETRON HCL 4 MG/2ML IJ SOLN
INTRAMUSCULAR | Status: AC
  Filled 2014-10-29: qty 2

## 2014-10-29 MED ORDER — FERROUS SULFATE 325 (65 FE) MG PO TABS
325.0000 mg | ORAL_TABLET | Freq: Every day | ORAL | Status: DC
  Administered 2014-10-29 – 2014-11-05 (×8): 325 mg via ORAL
  Filled 2014-10-29 (×7): qty 1

## 2014-10-29 MED ORDER — ROSUVASTATIN CALCIUM 10 MG PO TABS
10.0000 mg | ORAL_TABLET | Freq: Every day | ORAL | Status: DC
  Administered 2014-10-29 – 2014-11-07 (×10): 10 mg via ORAL
  Filled 2014-10-29 (×10): qty 1

## 2014-10-29 NOTE — Progress Notes (Addendum)
Ambulate pt from the bed to the hall near pt door with 2 person assistance. Pt complains abd pain at this point and send pt via chair. Pt denied dizziness or nausea.

## 2014-10-29 NOTE — Progress Notes (Signed)
[facility] Progress Note  1 Day Post-Op  Subjective: Pt doing well, no N/V, still with pain in RLQ, but improved.  Hasn't been OOB yet.  Some burping, but no flatus or BM yet.  Tolerated clear liquids well for breakfast.  Denies SOB.    Objective: Vital signs in last 24 hours: Temp:  [98 F (36.7 C)-99.6 F (37.6 C)] 98 F (36.7 C) (04/29 0431) Pulse Rate:  [75-98] 84 (04/29 0431) Resp:  [15-36] 17 (04/29 0431) BP: (95-143)/(40-93) 108/52 mmHg (04/29 0431) SpO2:  [88 %-100 %] 100 % (04/29 0431) Last BM Date: 10/27/14  Intake/Output from previous day: 04/28 0701 - 04/29 0700 In: 1100 [I.V.:1100] Out: 410 [Urine:410] Intake/Output this shift:    PE: Gen:  Alert, NAD, pleasant Pulm:  IS up to 400 Abd: Soft, NT/ND, +BS, no HSM, incisions C/D/I, drain with minimal sanguinous drainage, no abdominal scars noted   Lab Results:   Recent Labs  10/28/14 0846 10/28/14 0856 10/29/14 0407  WBC 12.4*  --  16.4*  HGB 12.8 15.0 11.9*  HCT 39.7 44.0 37.2  PLT 223  --  227   BMET  Recent Labs  10/28/14 0856 10/29/14 0407  NA 138 136  K 4.6 4.4  CL 105 104  CO2  --  21  GLUCOSE 128* 123*  BUN 16 17  CREATININE 1.10 1.36*  CALCIUM  --  8.0*   PT/INR No results for input(s): LABPROT, INR in the last 72 hours. CMP     Component Value Date/Time   NA 136 10/29/2014 0407   K 4.4 10/29/2014 0407   CL 104 10/29/2014 0407   CO2 21 10/29/2014 0407   GLUCOSE 123* 10/29/2014 0407   BUN 17 10/29/2014 0407   CREATININE 1.36* 10/29/2014 0407   CALCIUM 8.0* 10/29/2014 0407   PROT 6.8 10/29/2014 0407   ALBUMIN 2.8* 10/29/2014 0407   AST 12 10/29/2014 0407   ALT 9 10/29/2014 0407   ALKPHOS 61 10/29/2014 0407   BILITOT 0.6 10/29/2014 0407   GFRNONAA 38* 10/29/2014 0407   GFRAA 45* 10/29/2014 0407   Lipase  No results found for: LIPASE     Studies/Results: US Transvaginal Non-ob  10/28/2014   CLINICAL DATA:  Abdominal and vaginal pain  EXAM:  TRANSABDOMINAL AND TRANSVAGINAL ULTRASOUND OF PELVIS  TECHNIQUE: Both transabdominal and transvaginal ultrasound examinations of the pelvis were performed. Transabdominal technique was performed for global imaging of the pelvis including uterus, ovaries, adnexal regions, and pelvic cul-de-sac. It was necessary to proceed with endovaginal exam following the transabdominal exam to visualize the adnexa.  COMPARISON:  09/17/2006  FINDINGS: Surgically absent uterus.  Negative vaginal cuff.  The ovaries, if remaining, cannot be visualized confidently. Thick walled tubular structure in the right lower quadrant, with small localized ascites (that is complex due to internal echoes) could reflect thick small bowel or appendix thickening.  IMPRESSION: 1. Suspect bowel thickening/inflammation in the right lower quadrant, where there is also small mildly complex ascites. Suggest contrast-enhanced abdominal CT in this patient with leukocytosis. 2. Hysterectomy. The ovaries, if remaining, could not be identified.   Electronically Signed   By: Monte Fantasia M.D.   On: 10/28/2014 12:28   US Pelvis Complete  10/28/2014   CLINICAL DATA:  Abdominal and vaginal pain  EXAM: TRANSABDOMINAL AND TRANSVAGINAL ULTRASOUND OF PELVIS  TECHNIQUE: Both transabdominal and transvaginal ultrasound examinations of the pelvis were performed. Transabdominal technique was performed for global imaging of the pelvis including uterus, ovaries, adnexal regions, and  pelvic cul-de-sac. It was necessary to proceed with endovaginal exam following the transabdominal exam to visualize the adnexa.  COMPARISON:  09/17/2006  FINDINGS: Surgically absent uterus.  Negative vaginal cuff.  The ovaries, if remaining, cannot be visualized confidently. Thick walled tubular structure in the right lower quadrant, with small localized ascites (that is complex due to internal echoes) could reflect thick small bowel or appendix thickening.  IMPRESSION: 1. Suspect bowel  thickening/inflammation in the right lower quadrant, where there is also small mildly complex ascites. Suggest contrast-enhanced abdominal CT in this patient with leukocytosis. 2. Hysterectomy. The ovaries, if remaining, could not be identified.   Electronically Signed   By: Monte Fantasia M.D.   On: 10/28/2014 12:28   Ct Abdomen Pelvis W Contrast  10/28/2014   CLINICAL DATA:  Abdominal and vaginal pain beginning 2 days ago.  EXAM: CT ABDOMEN AND PELVIS WITH CONTRAST  TECHNIQUE: Multidetector CT imaging of the abdomen and pelvis was performed using the standard protocol following bolus administration of intravenous contrast.  CONTRAST:  154mL OMNIPAQUE IOHEXOL 300 MG/ML  SOLN  COMPARISON:  None.  FINDINGS: Dependent atelectatic change is seen in the lung bases, worse on the right. A small amount pericardial fluid is identified and there are trace bilateral pleural effusions. Heart size is mildly enlarged. A small hiatal hernia is noted.  The appendix is markedly dilated at 2.0 cm with a small appendicolith identified. There is extensive stranding in the right lower quadrant of the abdomen and about small bowel loops in the right lower quadrant. Scattered colonic diverticula are seen but no evidence of diverticulitis is identified. Liquid stool is noted in the ascending colon.  A 1.8 cm hepatic cyst is noted on image 18. The liver is otherwise unremarkable. The gallbladder, spleen, adrenal glands, pancreas and kidneys appear normal.  Scattered aortoiliac atherosclerosis without aneurysm is noted. There is a small volume of free pelvic and perihepatic fluid. No lymphadenopathy is identified. No lytic or sclerotic bony lesion is seen.  IMPRESSION: The study is positive for acute appendicitis with extensive inflammatory change in the right lower quadrant and a small volume of perihepatic and free pelvic fluid. Findings called to Dr. Malvin Johns at the time of interpretation.  Diverticulosis without diverticulitis.   Small hiatal hernia.  Aortoiliac atherosclerosis without aneurysm.   Electronically Signed   By: Inge Rise M.D.   On: 10/28/2014 15:45    Anti-infectives: Anti-infectives    Start     Dose/Rate Route Frequency Ordered Stop   10/29/14 0000  piperacillin-tazobactam (ZOSYN) IVPB 3.375 g     3.375 g 12.5 mL/hr over 240 Minutes Intravenous Every 8 hours 10/28/14 2321     10/28/14 1600  piperacillin-tazobactam (ZOSYN) IVPB 3.375 g     3.375 g 100 mL/hr over 30 Minutes Intravenous  Once 10/28/14 1556 10/28/14 1649       Assessment/Plan Acute appendicitis with generalized peritonitis and perforation POD #1 s/p lap appy (Dr. Brantley Stage) -IVF, pain control, antiemetics,  -Continue IV antibiotics (Zosyn Day #2) -Ambulate and IS -SCD's and lovenox -On clears, advance to fulls at lunch, soft at dinner if tolerating -Discharge home when WBC improved, pain well controlled, and tolerating solid food. Leukocytosis 16.4    LOS: 1 day    DORT, Jinny Blossom 10/29/2014, 8:56 AM Pager: [phone]

## 2014-10-29 NOTE — Progress Notes (Signed)
Utilization review completed.  

## 2014-10-30 LAB — URINE CULTURE
COLONY COUNT: NO GROWTH
Culture: NO GROWTH

## 2014-10-30 LAB — CBC
HCT: 38.5 % (ref 36.0–46.0)
HEMOGLOBIN: 12.1 g/dL (ref 12.0–15.0)
MCH: 22.6 pg — ABNORMAL LOW (ref 26.0–34.0)
MCHC: 31.4 g/dL (ref 30.0–36.0)
MCV: 72 fL — ABNORMAL LOW (ref 78.0–100.0)
Platelets: 248 10*3/uL (ref 150–400)
RBC: 5.35 MIL/uL — AB (ref 3.87–5.11)
RDW: 15.7 % — ABNORMAL HIGH (ref 11.5–15.5)
WBC: 16.3 10*3/uL — ABNORMAL HIGH (ref 4.0–10.5)

## 2014-10-30 NOTE — Progress Notes (Signed)
2 Days Post-Op  Subjective: Pt doing well.  Tol Reg diet, but feels bloated.  +bowel function  Objective: Vital signs in last 24 hours: Temp:  [98.6 F (37 C)-99.3 F (37.4 C)] 98.6 F (37 C) (04/30 0619) Pulse Rate:  [74-84] 84 (04/30 0619) BP: (111-117)/(55-59) 117/59 mmHg (04/30 0619) SpO2:  [96 %-97 %] 96 % (04/30 0619) Last BM Date: 10/27/14  Intake/Output from previous day: 04/29 0701 - 04/30 0700 In: 240 [P.O.:240] Out: 750 [Urine:500; Emesis/NG output:250] Intake/Output this shift:    General appearance: alert and cooperative GI: soft, approp ttp, distended  Lab Results:   Recent Labs  10/29/14 0407 10/30/14 0426  WBC 16.4* 16.3*  HGB 11.9* 12.1  HCT 37.2 38.5  PLT 227 248   BMET  Recent Labs  10/28/14 0856 10/29/14 0407  NA 138 136  K 4.6 4.4  CL 105 104  CO2  --  21  GLUCOSE 128* 123*  BUN 16 17  CREATININE 1.10 1.36*  CALCIUM  --  8.0*   PT/INR No results for input(s): LABPROT, INR in the last 72 hours. ABG No results for input(s): PHART, HCO3 in the last 72 hours.  Invalid input(s): PCO2, PO2  Studies/Results: US Transvaginal Non-ob  10/28/2014   CLINICAL DATA:  Abdominal and vaginal pain  EXAM: TRANSABDOMINAL AND TRANSVAGINAL ULTRASOUND OF PELVIS  TECHNIQUE: Both transabdominal and transvaginal ultrasound examinations of the pelvis were performed. Transabdominal technique was performed for global imaging of the pelvis including uterus, ovaries, adnexal regions, and pelvic cul-de-sac. It was necessary to proceed with endovaginal exam following the transabdominal exam to visualize the adnexa.  COMPARISON:  09/17/2006  FINDINGS: Surgically absent uterus.  Negative vaginal cuff.  The ovaries, if remaining, cannot be visualized confidently. Thick walled tubular structure in the right lower quadrant, with small localized ascites (that is complex due to internal echoes) could reflect thick small bowel or appendix thickening.  IMPRESSION: 1. Suspect  bowel thickening/inflammation in the right lower quadrant, where there is also small mildly complex ascites. Suggest contrast-enhanced abdominal CT in this patient with leukocytosis. 2. Hysterectomy. The ovaries, if remaining, could not be identified.   Electronically Signed   By: Monte Fantasia M.D.   On: 10/28/2014 12:28   US Pelvis Complete  10/28/2014   CLINICAL DATA:  Abdominal and vaginal pain  EXAM: TRANSABDOMINAL AND TRANSVAGINAL ULTRASOUND OF PELVIS  TECHNIQUE: Both transabdominal and transvaginal ultrasound examinations of the pelvis were performed. Transabdominal technique was performed for global imaging of the pelvis including uterus, ovaries, adnexal regions, and pelvic cul-de-sac. It was necessary to proceed with endovaginal exam following the transabdominal exam to visualize the adnexa.  COMPARISON:  09/17/2006  FINDINGS: Surgically absent uterus.  Negative vaginal cuff.  The ovaries, if remaining, cannot be visualized confidently. Thick walled tubular structure in the right lower quadrant, with small localized ascites (that is complex due to internal echoes) could reflect thick small bowel or appendix thickening.  IMPRESSION: 1. Suspect bowel thickening/inflammation in the right lower quadrant, where there is also small mildly complex ascites. Suggest contrast-enhanced abdominal CT in this patient with leukocytosis. 2. Hysterectomy. The ovaries, if remaining, could not be identified.   Electronically Signed   By: Monte Fantasia M.D.   On: 10/28/2014 12:28   Ct Abdomen Pelvis W Contrast  10/28/2014   CLINICAL DATA:  Abdominal and vaginal pain beginning 2 days ago.  EXAM: CT ABDOMEN AND PELVIS WITH CONTRAST  TECHNIQUE: Multidetector CT imaging of the abdomen and pelvis was performed  using the standard protocol following bolus administration of intravenous contrast.  CONTRAST:  136mL OMNIPAQUE IOHEXOL 300 MG/ML  SOLN  COMPARISON:  None.  FINDINGS: Dependent atelectatic change is seen in the  lung bases, worse on the right. A small amount pericardial fluid is identified and there are trace bilateral pleural effusions. Heart size is mildly enlarged. A small hiatal hernia is noted.  The appendix is markedly dilated at 2.0 cm with a small appendicolith identified. There is extensive stranding in the right lower quadrant of the abdomen and about small bowel loops in the right lower quadrant. Scattered colonic diverticula are seen but no evidence of diverticulitis is identified. Liquid stool is noted in the ascending colon.  A 1.8 cm hepatic cyst is noted on image 18. The liver is otherwise unremarkable. The gallbladder, spleen, adrenal glands, pancreas and kidneys appear normal.  Scattered aortoiliac atherosclerosis without aneurysm is noted. There is a small volume of free pelvic and perihepatic fluid. No lymphadenopathy is identified. No lytic or sclerotic bony lesion is seen.  IMPRESSION: The study is positive for acute appendicitis with extensive inflammatory change in the right lower quadrant and a small volume of perihepatic and free pelvic fluid. Findings called to Dr. Malvin Johns at the time of interpretation.  Diverticulosis without diverticulitis.  Small hiatal hernia.  Aortoiliac atherosclerosis without aneurysm.   Electronically Signed   By: Inge Rise M.D.   On: 10/28/2014 15:45    Anti-infectives: Anti-infectives    Start     Dose/Rate Route Frequency Ordered Stop   10/29/14 0000  piperacillin-tazobactam (ZOSYN) IVPB 3.375 g     3.375 g 12.5 mL/hr over 240 Minutes Intravenous Every 8 hours 10/28/14 2321     10/28/14 1600  piperacillin-tazobactam (ZOSYN) IVPB 3.375 g     3.375 g 100 mL/hr over 30 Minutes Intravenous  Once 10/28/14 1556 10/28/14 1649      Assessment/Plan: s/p Procedure(s): APPENDECTOMY LAPAROSCOPIC (N/A) Con' diet as ordered Con't abx Home in next 1-2d if distention con't to decrease   LOS: 2 days    Rosario Jacks., [facility] 10/30/2014

## 2014-10-31 ENCOUNTER — Encounter (HOSPITAL_COMMUNITY): Payer: Self-pay | Admitting: Surgery

## 2014-10-31 NOTE — Progress Notes (Signed)
3 Days Post-Op  Subjective: Vomited after eating lunch, is passing gas and did have a BM  Objective: Vital signs in last 24 hours: Temp:  [98.5 F (36.9 C)] 98.5 F (36.9 C) (05/01 0512) Pulse Rate:  [43-82] 43 (05/01 0512) Resp:  [16] 16 (05/01 0512) BP: (108-115)/(56-59) 108/56 mmHg (05/01 0512) SpO2:  [97 %] 97 % (05/01 0512) Last BM Date: 10/30/14  Intake/Output from previous day: 04/30 0701 - 05/01 0700 In: 240 [P.O.:240] Out: 2 [Stool:2] Intake/Output this shift: Total I/O In: 240 [P.O.:240] Out: -   General appearance: alert and cooperative Resp: clear to auscultation bilaterally GI: distended, +BS, tender R side near incisions  Lab Results:   Recent Labs  10/29/14 0407 10/30/14 0426  WBC 16.4* 16.3*  HGB 11.9* 12.1  HCT 37.2 38.5  PLT 227 248   BMET  Recent Labs  10/29/14 0407  NA 136  K 4.4  CL 104  CO2 21  GLUCOSE 123*  BUN 17  CREATININE 1.36*  CALCIUM 8.0*   PT/INR No results for input(s): LABPROT, INR in the last 72 hours. ABG No results for input(s): PHART, HCO3 in the last 72 hours.  Invalid input(s): PCO2, PO2  Studies/Results: No results found.  Anti-infectives: Anti-infectives    Start     Dose/Rate Route Frequency Ordered Stop   10/29/14 0000  piperacillin-tazobactam (ZOSYN) IVPB 3.375 g     3.375 g 12.5 mL/hr over 240 Minutes Intravenous Every 8 hours 10/28/14 2321     10/28/14 1600  piperacillin-tazobactam (ZOSYN) IVPB 3.375 g     3.375 g 100 mL/hr over 30 Minutes Intravenous  Once 10/28/14 1556 10/28/14 1649      Assessment/Plan: s/p Procedure(s): APPENDECTOMY LAPAROSCOPIC (N/A) POD#3 S/P lap appy for perf appendicitis Ileus - back down to clears Labs in AM Zosyn  LOS: 3 days    Lachina Salsberry E 10/31/2014

## 2014-11-01 LAB — BASIC METABOLIC PANEL
Anion gap: 9 (ref 5–15)
BUN: 13 mg/dL (ref 6–20)
CALCIUM: 8 mg/dL — AB (ref 8.9–10.3)
CO2: 25 mmol/L (ref 22–32)
Chloride: 104 mmol/L (ref 101–111)
Creatinine, Ser: 1.01 mg/dL — ABNORMAL HIGH (ref 0.44–1.00)
GFR calc Af Amer: >60 mL/min (ref >60)
GFR calc non Af Amer: 55 mL/min — ABNORMAL LOW (ref >60)
Glucose, Bld: 97 mg/dL (ref 70–99)
Potassium: 4 mmol/L (ref 3.5–5.1)
SODIUM: 138 mmol/L (ref 135–145)

## 2014-11-01 LAB — CBC
HEMATOCRIT: 34.3 % — AB (ref 36.0–46.0)
Hemoglobin: 10.9 g/dL — ABNORMAL LOW (ref 12.0–15.0)
MCH: 22.8 pg — ABNORMAL LOW (ref 26.0–34.0)
MCHC: 31.8 g/dL (ref 30.0–36.0)
MCV: 71.6 fL — ABNORMAL LOW (ref 78.0–100.0)
Platelets: 187 10*3/uL (ref 150–400)
RBC: 4.79 MIL/uL (ref 3.87–5.11)
RDW: 15.6 % — ABNORMAL HIGH (ref 11.5–15.5)
WBC: 14.4 10*3/uL — ABNORMAL HIGH (ref 4.0–10.5)

## 2014-11-01 NOTE — Progress Notes (Signed)
[facility] Progress Note 4 Days Post-Op  Subjective: Patient vomited again this AM.  No bowel movement  Objective: Vital signs in last 24 hours: Temp:  [98.5 F (36.9 C)-99.3 F (37.4 C)] 98.5 F (36.9 C) (05/02 0441) Pulse Rate:  [73-94] 94 (05/02 0441) Resp:  [17-18] 17 (05/02 0441) BP: (114-128)/(60-72) 128/62 mmHg (05/02 0441) SpO2:  [94 %-100 %] 94 % (05/02 0441) Last BM Date: 11/01/14  Intake/Output from previous day: 05/01 0701 - 05/02 0700 In: 480 [P.O.:480] Out: -  Intake/Output this shift:    General: No acute distress at rest, but seemingly in distress with palpation.  Lungs: Clear to auscultation  Abd: distended, diffusely tender with not distracted, but not tender at all when distracted  Extremities: No clinical signs or symptoms of DVT  Neuro: Intact  Lab Results:  @LABLAST2 (wbc:2,hgb:2,hct:2,plt:2) BMET  Recent Labs  11/01/14 0555  NA 138  K 4.0  CL 104  CO2 25  GLUCOSE 97  BUN 13  CREATININE 1.01*  CALCIUM 8.0*   PT/INR No results for input(s): LABPROT, INR in the last 72 hours. ABG No results for input(s): PHART, HCO3 in the last 72 hours.  Invalid input(s): PCO2, PO2  Studies/Results: No results found.  Anti-infectives: Anti-infectives    Start     Dose/Rate Route Frequency Ordered Stop   10/29/14 0000  piperacillin-tazobactam (ZOSYN) IVPB 3.375 g     3.375 g 12.5 mL/hr over 240 Minutes Intravenous Every 8 hours 10/28/14 2321     10/28/14 1600  piperacillin-tazobactam (ZOSYN) IVPB 3.375 g     3.375 g 100 mL/hr over 30 Minutes Intravenous  Once 10/28/14 1556 10/28/14 1649      Assessment/Plan: s/p Procedure(s): APPENDECTOMY LAPAROSCOPIC Continue ABX therapy due to Post-op infection Ruptured appendicitis, POD #4 with ileus  Until vomiting stops will not advance diet.  LOS: 4 days   Kathryne Eriksson. Dahlia Bailiff, MD, FACS [phone] [facility] 11/01/2014

## 2014-11-02 NOTE — Progress Notes (Signed)
General Surgery Note  LOS: 5 days  POD -  5 Days Post-Op  Assessment/Plan: 1.  APPENDECTOMY LAPAROSCOPIC - 10/28/2014 - T. Cornett  Perforated appendicitis  Zosyn - 10/28/2014 >>>  She had post op ileus, but is improving now.  To advance to full liquids - possibly home tomorrow  2.  HTN 3.  Peripheral vascular disease 4.  Cystocele 5.  DVT prophylaxis - Lovenox   Principal Problem:   Acute appendicitis Active Problems:   Perforated appendicitis  Subjective:  She had a BM today.  She does not want to go to reg diet yet.  Will advance to full liquids.  Needs to walk more. Objective:   Filed Vitals:   11/02/14 0547  BP: 117/59  Pulse: 84  Temp: 98.1 F (36.7 C)  Resp:      Intake/Output from previous day:  05/02 0701 - 05/03 0700 In: 520 [P.O.:520] Out: -   Intake/Output this shift:      Physical Exam:   General: WN older AA F who is alert and oriented.    HEENT: Normal. Pupils equal. .   Lungs: Clear, but IS only 500 cc.  I'm not sure she understands well how to use it.   Abdomen: Mildly distended.  BS present.   Wound: Clean   Lab Results:    Recent Labs  11/01/14 0555  WBC 14.4*  HGB 10.9*  HCT 34.3*  PLT 187    BMET   Recent Labs  11/01/14 0555  NA 138  K 4.0  CL 104  CO2 25  GLUCOSE 97  BUN 13  CREATININE 1.01*  CALCIUM 8.0*    PT/INR  No results for input(s): LABPROT, INR in the last 72 hours.  ABG  No results for input(s): PHART, HCO3 in the last 72 hours.  Invalid input(s): PCO2, PO2   Studies/Results:  No results found.   Anti-infectives:   Anti-infectives    Start     Dose/Rate Route Frequency Ordered Stop   10/29/14 0000  piperacillin-tazobactam (ZOSYN) IVPB 3.375 g     3.375 g 12.5 mL/hr over 240 Minutes Intravenous Every 8 hours 10/28/14 2321     10/28/14 1600  piperacillin-tazobactam (ZOSYN) IVPB 3.375 g     3.375 g 100 mL/hr over 30 Minutes Intravenous  Once 10/28/14 1556 10/28/14 1649      Andrea Overall, MD,  FACS Pager: [facility] Office: [phone] 11/02/2014

## 2014-11-03 ENCOUNTER — Inpatient Hospital Stay (HOSPITAL_COMMUNITY): Payer: Medicare Other

## 2014-11-03 ENCOUNTER — Encounter (HOSPITAL_COMMUNITY): Payer: Self-pay | Admitting: Radiology

## 2014-11-03 LAB — BASIC METABOLIC PANEL
ANION GAP: 12 (ref 5–15)
BUN: 9 mg/dL (ref 6–20)
CO2: 27 mmol/L (ref 22–32)
CREATININE: 1.04 mg/dL — AB (ref 0.44–1.00)
Calcium: 8.4 mg/dL — ABNORMAL LOW (ref 8.9–10.3)
Chloride: 101 mmol/L (ref 101–111)
GFR calc non Af Amer: 53 mL/min — ABNORMAL LOW (ref >60)
Glucose, Bld: 125 mg/dL — ABNORMAL HIGH (ref 70–99)
Potassium: 3.6 mmol/L (ref 3.5–5.1)
SODIUM: 140 mmol/L (ref 135–145)

## 2014-11-03 LAB — CBC
HCT: 36.7 % (ref 36.0–46.0)
HEMOGLOBIN: 11.9 g/dL — AB (ref 12.0–15.0)
MCH: 22.9 pg — AB (ref 26.0–34.0)
MCHC: 32.4 g/dL (ref 30.0–36.0)
MCV: 70.7 fL — ABNORMAL LOW (ref 78.0–100.0)
Platelets: 78 10*3/uL — ABNORMAL LOW (ref 150–400)
RBC: 5.19 MIL/uL — ABNORMAL HIGH (ref 3.87–5.11)
RDW: 15.2 % (ref 11.5–15.5)
WBC: 14.7 10*3/uL — ABNORMAL HIGH (ref 4.0–10.5)

## 2014-11-03 MED ORDER — IOHEXOL 300 MG/ML  SOLN
25.0000 mL | INTRAMUSCULAR | Status: AC
  Administered 2014-11-03 (×2): 25 mL via ORAL

## 2014-11-03 MED ORDER — IOHEXOL 300 MG/ML  SOLN: 100.0000 mL | Freq: Once | INTRAMUSCULAR | Status: AC | PRN

## 2014-11-03 NOTE — Discharge Instructions (Signed)
[facility] ______[facility] °LAPAROSCOPIC SURGERY: POST OP INSTRUCTIONS °Always review your discharge instruction sheet given to you by the facility where your surgery was performed. °IF YOU HAVE DISABILITY OR FAMILY LEAVE FORMS, YOU MUST BRING THEM TO THE OFFICE FOR PROCESSING.   °DO NOT GIVE THEM TO YOUR DOCTOR. ° °1. A prescription for pain medication may be given to you upon discharge.  Take your pain medication as prescribed, if needed.  If narcotic pain medicine is not needed, then you may take acetaminophen (Tylenol) or ibuprofen (Advil) as needed. °2. Take your usually prescribed medications unless otherwise directed. °3. If you need a refill on your pain medication, please contact your pharmacy.  They will contact our office to request authorization. Prescriptions will not be filled after 5pm or on week-ends. °4. You should follow a light diet the first few days after arrival home, such as soup and crackers, etc.  Be sure to include lots of fluids daily. °5. Most patients will experience some swelling and bruising in the area of the incisions.  Ice packs will help.  Swelling and bruising can take several days to resolve.  °6. It is common to experience some constipation if taking pain medication after surgery.  Increasing fluid intake and taking a stool softener (such as Colace) will usually help or prevent this problem from occurring.  A mild laxative (Milk of Magnesia or Miralax) should be taken according to package instructions if there are no bowel movements after 48 hours. °7. Unless discharge instructions indicate otherwise, you may remove your bandages 24-48 hours after surgery, and you may shower at that time.  You may have steri-strips (small skin tapes) in place directly over the incision.  These strips should be left on the skin for 7-10 days.  If your surgeon used skin glue on the incision, you may shower in 24 hours.  The glue will flake off over the next 2-3 weeks.  Any sutures or  staples will be removed at the office during your follow-up visit. °8. ACTIVITIES:  You may resume regular (light) daily activities beginning the next day--such as daily self-care, walking, climbing stairs--gradually increasing activities as tolerated.  You may have sexual intercourse when it is comfortable.  Refrain from any heavy lifting or straining until approved by your doctor. °a. You may drive when you are no longer taking prescription pain medication, you can comfortably wear a seatbelt, and you can safely maneuver your car and apply brakes. °b. RETURN TO WORK:  __________________________________________________________ °9. You should see your doctor in the office for a follow-up appointment approximately 2-3 weeks after your surgery.  Make sure that you call for this appointment within a day or two after you arrive home to insure a convenient appointment time. °10. OTHER INSTRUCTIONS: __________________________________________________________________________________________________________________________ __________________________________________________________________________________________________________________________ °WHEN TO CALL YOUR DOCTOR: °1. Fever over 101.0 °2. Inability to urinate °3. Continued bleeding from incision. °4. Increased pain, redness, or drainage from the incision. °5. Increasing abdominal pain ° °The clinic staff is available to answer your questions during regular business hours.  Please don’t hesitate to call and ask to speak to one of the nurses for clinical concerns.  If you have a medical emergency, go to the nearest emergency room or call 911.  A surgeon from [facility] is always on call at the hospital. °[address], [address], [place], [place]  [address] ? [address] [address], [place], [place]   [address] °[phone] ? [phone] ? FAX [phone] °Web site:   [email or web address] °

## 2014-11-03 NOTE — Progress Notes (Signed)
Patient ID: Andrea Keller, female   DOB: February 16, 1946, 69 y.o.   MRN: [id number] 6 Days Post-Op  Subjective: Pt c/o some RLQ abdominal pain, but feels ok.  Tolerating full liquids.    Objective: Vital signs in last 24 hours: Temp:  [98.2 F (36.8 C)-98.3 F (36.8 C)] 98.2 F (36.8 C) (05/04 0519) Pulse Rate:  [92-107] 107 (05/04 0519) Resp:  [20] 20 (05/04 0519) BP: (127-146)/(75-76) 146/76 mmHg (05/04 0519) SpO2:  [92 %-96 %] 96 % (05/04 0519) Last BM Date: 11/02/14  Intake/Output from previous day: 05/03 0701 - 05/04 0700 In: 60 [P.O.:880] Out: -  Intake/Output this shift:    PE: Abd: soft, more tender than I expected in RLQ, guards some with palpation, some BS, ND Heart: mildly tachy, but regular Lungs: CTAB  Lab Results:   Recent Labs  11/01/14 0555  WBC 14.4*  HGB 10.9*  HCT 34.3*  PLT 187   BMET  Recent Labs  11/01/14 0555  NA 138  K 4.0  CL 104  CO2 25  GLUCOSE 97  BUN 13  CREATININE 1.01*  CALCIUM 8.0*   PT/INR No results for input(s): LABPROT, INR in the last 72 hours. CMP     Component Value Date/Time   NA 138 11/01/2014 0555   K 4.0 11/01/2014 0555   CL 104 11/01/2014 0555   CO2 25 11/01/2014 0555   GLUCOSE 97 11/01/2014 0555   BUN 13 11/01/2014 0555   CREATININE 1.01* 11/01/2014 0555   CALCIUM 8.0* 11/01/2014 0555   PROT 6.8 10/29/2014 0407   ALBUMIN 2.8* 10/29/2014 0407   AST 12 10/29/2014 0407   ALT 9 10/29/2014 0407   ALKPHOS 61 10/29/2014 0407   BILITOT 0.6 10/29/2014 0407   GFRNONAA 55* 11/01/2014 0555   GFRAA >60 11/01/2014 0555   Lipase  No results found for: LIPASE     Studies/Results: No results found.  Anti-infectives: Anti-infectives    Start     Dose/Rate Route Frequency Ordered Stop   10/29/14 0000  piperacillin-tazobactam (ZOSYN) IVPB 3.375 g     3.375 g 12.5 mL/hr over 240 Minutes Intravenous Every 8 hours 10/28/14 2321     10/28/14 1600  piperacillin-tazobactam (ZOSYN) IVPB 3.375 g     3.375 g 100  mL/hr over 30 Minutes Intravenous  Once 10/28/14 1556 10/28/14 1649       Assessment/Plan 1. APPENDECTOMY LAPAROSCOPIC - 10/28/2014 - 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 -patient's WBC was still elevated 2 days ago.  Given how tender she is I want to recheck this prior to DC home.  If this is still elevated, I think a CT scan is warranted to rule out post op abscess or some other complication.            - I have contacted Dr. Rebecka Apley office and set up outpatient follow up for the patient.  I have spoken to her and her husband via the phone to discuss her pathology report findings.     2. HTN 3. Peripheral vascular disease 4. Cystocele 5. DVT prophylaxis - Lovenox    LOS: 6 days    Andrea Keller E 11/03/2014, 8:46 AM Pager: [phone]

## 2014-11-04 ENCOUNTER — Encounter (HOSPITAL_COMMUNITY): Payer: Self-pay | Admitting: General Practice

## 2014-11-04 LAB — CBC
HCT: 34.6 % — ABNORMAL LOW (ref 36.0–46.0)
HEMOGLOBIN: 11.2 g/dL — AB (ref 12.0–15.0)
MCH: 23 pg — AB (ref 26.0–34.0)
MCHC: 32.4 g/dL (ref 30.0–36.0)
MCV: 70.9 fL — AB (ref 78.0–100.0)
Platelets: 51 10*3/uL — ABNORMAL LOW (ref 150–400)
RBC: 4.88 MIL/uL (ref 3.87–5.11)
RDW: 15.4 % (ref 11.5–15.5)
WBC: 14.2 10*3/uL — AB (ref 4.0–10.5)

## 2014-11-04 LAB — BASIC METABOLIC PANEL
Anion gap: 11 (ref 5–15)
BUN: 9 mg/dL (ref 6–20)
CALCIUM: 8.2 mg/dL — AB (ref 8.9–10.3)
CHLORIDE: 103 mmol/L (ref 101–111)
CO2: 24 mmol/L (ref 22–32)
Creatinine, Ser: 1.07 mg/dL — ABNORMAL HIGH (ref 0.44–1.00)
GFR calc Af Amer: 60 mL/min — ABNORMAL LOW (ref >60)
GFR calc non Af Amer: 51 mL/min — ABNORMAL LOW (ref >60)
GLUCOSE: 99 mg/dL (ref 70–99)
POTASSIUM: 3.6 mmol/L (ref 3.5–5.1)
SODIUM: 138 mmol/L (ref 135–145)

## 2014-11-04 LAB — PROTIME-INR
INR: 1.16 (ref 0.00–1.49)
Prothrombin Time: 14.9 seconds (ref 11.6–15.2)

## 2014-11-04 LAB — CLOSTRIDIUM DIFFICILE BY PCR: CDIFFPCR: NEGATIVE

## 2014-11-04 MED ORDER — SODIUM CHLORIDE 0.9 % IV SOLN
1.0000 g | INTRAVENOUS | Status: DC
  Administered 2014-11-04 – 2014-11-05 (×2): 1 g via INTRAVENOUS
  Filled 2014-11-04 (×2): qty 1

## 2014-11-04 NOTE — Progress Notes (Signed)
Pt c/o of having tingling and pain in her R arm. States that she thinks it was the new antibiotic. She received the full dose of abx today. After pt was disconnected from iv pump and given PRN pain medications, pt stated that the sensation had gone away. MD notified and does not suspect that it was the antibiotic.

## 2014-11-04 NOTE — Progress Notes (Signed)
Patient ID: Andrea Keller, female   DOB: 1946-01-02, 69 y.o.   MRN: [id number] 7 Days Post-Op  Subjective: Pt feels well today.  Says pain is improved after having multiple BMs yesterday.  Stool c diff negative.  Tolerating regular diet  Objective: Vital signs in last 24 hours: Temp:  [98.3 F (36.8 C)-99.6 F (37.6 C)] 99 F (37.2 C) (05/05 0600) Pulse Rate:  [90-94] 92 (05/05 0600) Resp:  [16-18] 16 (05/05 0600) BP: (134-142)/(56-74) 138/58 mmHg (05/05 0600) SpO2:  [94 %-95 %] 95 % (05/05 0600) Last BM Date: 11/04/14  Intake/Output from previous day: 05/04 0701 - 05/05 0700 In: 720 [P.O.:720] Out: -  Intake/Output this shift: Total I/O In: 120 [P.O.:120] Out: -   PE: Abd: soft, still tender in RLQ, but less than yesterday, +BS, minimal bloating, incisions c/d/i Heart: regular Lungs: diminished at bases, but otherwise clear  Lab Results:   Recent Labs  11/03/14 1308 11/04/14 0420  WBC 14.7* 14.2*  HGB 11.9* 11.2*  HCT 36.7 34.6*  PLT 78* 51*   BMET  Recent Labs  11/03/14 1521 11/04/14 0420  NA 140 138  K 3.6 3.6  CL 101 103  CO2 27 24  GLUCOSE 125* 99  BUN 9 9  CREATININE 1.04* 1.07*  CALCIUM 8.4* 8.2*   PT/INR  Recent Labs  11/04/14 0420  LABPROT 14.9  INR 1.16   CMP     Component Value Date/Time   NA 138 11/04/2014 0420   K 3.6 11/04/2014 0420   CL 103 11/04/2014 0420   CO2 24 11/04/2014 0420   GLUCOSE 99 11/04/2014 0420   BUN 9 11/04/2014 0420   CREATININE 1.07* 11/04/2014 0420   CALCIUM 8.2* 11/04/2014 0420   PROT 6.8 10/29/2014 0407   ALBUMIN 2.8* 10/29/2014 0407   AST 12 10/29/2014 0407   ALT 9 10/29/2014 0407   ALKPHOS 61 10/29/2014 0407   BILITOT 0.6 10/29/2014 0407   GFRNONAA 51* 11/04/2014 0420   GFRAA 60* 11/04/2014 0420   Lipase  No results found for: LIPASE     Studies/Results: Ct Abdomen Pelvis W Contrast  11/03/2014   CLINICAL DATA:  Abdominal pain with nausea, vomiting and diarrhea status post appendectomy.  Subsequent encounter.  EXAM: CT ABDOMEN AND PELVIS WITH CONTRAST  TECHNIQUE: Multidetector CT imaging of the abdomen and pelvis was performed using the standard protocol following bolus administration of intravenous contrast.  CONTRAST:  100 ml Omnipaque 300.  COMPARISON:  Abdominal pelvic CT 10/28/2014.  FINDINGS: Lower chest: There are enlarging right-greater-than-left pleural effusions with worsening dependent airspace opacities at both lung bases. Small pericardial effusion has also mildly enlarged.  Hepatobiliary: There is a stable 1.8 cm cyst centrally in the liver superior to the gallbladder. No other hepatic abnormalities are identified. However, there is a new lenticular shaped fluid collection anterior to the liver which appears subdiaphragmatic to on the reformatted images. This measures up to 5.9 cm transverse on image 17, but less than 2 cm in thickness. No evidence of gallstones, gallbladder wall thickening or biliary dilatation.  Pancreas: Unremarkable. No pancreatic ductal dilatation or surrounding inflammatory changes.  Spleen: Normal in size without focal abnormality.  Adrenals/Urinary Tract: Both adrenal glands appear normal.The kidneys appear normal without evidence of urinary tract calculus, suspicious lesion or hydronephrosis. No bladder abnormalities are seen. There is contrast material within the urinary bladder, suggesting preexisting contrast administration.  Stomach/Bowel: Interval appendectomy. There is a small fluid collection posterior to the cecal tip, measuring 2.8 cm maximally  on axial image 71 and up to 4.5 cm in length. There is an additional lenticular shaped fluid collection along the anterior margin of the right colon, best seen on coronal image 46, measuring less than 2 cm in thickness. There is a 2 mm fluid collection in the pelvis, best seen on coronal image number 98.There is no generalized ascites or extravasated enteric contrast. There is some wall thickening of the  terminal ileum. No evidence of bowel obstruction.  Vascular/Lymphatic: Small retroperitoneal and mesenteric lymph nodes are likely reactive. Stable aortoiliac atherosclerosis.  Reproductive: Status post hysterectomy.  No adnexal mass.  Other: No evidence of abdominal wall mass or hernia.  Musculoskeletal: No acute or significant osseous findings.  IMPRESSION: 1. Scattered intraperitoneal fluid collections status post interval appendectomy. These may reflect small abscesses, but no easily drainable collections are demonstrated at this time. There is a possible subcapsular component along the anterior margin of the liver. 2. Wall thickening of the terminal ileum, likely reactive. No evidence of bowel obstruction or perforation. 3. Enlarging bilateral pleural effusions and small pericardial effusion with worsening atelectasis at both lung bases.   Electronically Signed   By: Richardean Sale M.D.   On: 11/03/2014 20:44    Anti-infectives: Anti-infectives    Start     Dose/Rate Route Frequency Ordered Stop   11/04/14 0945  ertapenem (INVANZ) 1 g in sodium chloride 0.9 % 50 mL IVPB     1 g 100 mL/hr over 30 Minutes Intravenous Every 24 hours 11/04/14 0944     10/29/14 0000  piperacillin-tazobactam (ZOSYN) IVPB 3.375 g  Status:  Discontinued     3.375 g 12.5 mL/hr over 240 Minutes Intravenous Every 8 hours 10/28/14 2321 11/04/14 0944   10/28/14 1600  piperacillin-tazobactam (ZOSYN) IVPB 3.375 g     3.375 g 100 mL/hr over 30 Minutes Intravenous  Once 10/28/14 1556 10/28/14 1649       Assessment/Plan   POD 7, s/p lap appy for perforated appendicitis, Goblet Cell Carcinoid  -CT 11-03-14 reveals multiple small interloop abscesses, too small to drain -WBC remains 14K, will change Zosyn over to Invanz.  Zosyn also has a small chance of causing thrombocytopenia. -cont regular diet -outpatient follow up already set up with Dr. Jyl Heinz at [facility] for 5-17 Leukocytosis -secondary to interloop  abscesses -as above, will change abx therapy today.  Cont IV Thrombocytopenia -significant decrease in platelets in short period of time.  187-->78-->51.  Lovenox was stopped yesterday.   -HIT panel ordered today.  If this positive or platelets continue to drop instead of stabilize out, will call hematology to see patient. -recheck labs in am DVT prophylaxis -SCDs Dispo -remain inpatient  LOS: 7 days    Ojani Berenson E 11/04/2014, 9:44 AM Pager: [phone]

## 2014-11-05 DIAGNOSIS — C181 Malignant neoplasm of appendix: Principal | ICD-10-CM

## 2014-11-05 DIAGNOSIS — R109 Unspecified abdominal pain: Secondary | ICD-10-CM

## 2014-11-05 DIAGNOSIS — D72829 Elevated white blood cell count, unspecified: Secondary | ICD-10-CM

## 2014-11-05 DIAGNOSIS — D696 Thrombocytopenia, unspecified: Secondary | ICD-10-CM

## 2014-11-05 DIAGNOSIS — D630 Anemia in neoplastic disease: Secondary | ICD-10-CM

## 2014-11-05 LAB — DIC (DISSEMINATED INTRAVASCULAR COAGULATION)PANEL
Fibrinogen: 793 mg/dL — ABNORMAL HIGH (ref 204–475)
Platelets: 46 10*3/uL — ABNORMAL LOW (ref 150–400)
Smear Review: NONE SEEN
aPTT: 36 seconds (ref 24–37)

## 2014-11-05 LAB — DIC (DISSEMINATED INTRAVASCULAR COAGULATION) PANEL
D DIMER QUANT: 16.14 ug{FEU}/mL — AB (ref 0.00–0.48)
INR: 1.17 (ref 0.00–1.49)
PROTHROMBIN TIME: 15 s (ref 11.6–15.2)

## 2014-11-05 LAB — CBC
HEMATOCRIT: 31.7 % — AB (ref 36.0–46.0)
Hemoglobin: 10.3 g/dL — ABNORMAL LOW (ref 12.0–15.0)
MCH: 22.9 pg — ABNORMAL LOW (ref 26.0–34.0)
MCHC: 32.5 g/dL (ref 30.0–36.0)
MCV: 70.4 fL — AB (ref 78.0–100.0)
Platelets: 46 10*3/uL — ABNORMAL LOW (ref 150–400)
RBC: 4.5 MIL/uL (ref 3.87–5.11)
RDW: 15.3 % (ref 11.5–15.5)
WBC: 12 10*3/uL — ABNORMAL HIGH (ref 4.0–10.5)

## 2014-11-05 LAB — SAVE SMEAR

## 2014-11-05 MED ORDER — CEFTRIAXONE SODIUM IN DEXTROSE 40 MG/ML IV SOLN
2.0000 g | INTRAVENOUS | Status: DC
  Administered 2014-11-05 – 2014-11-07 (×3): 2 g via INTRAVENOUS
  Filled 2014-11-05 (×5): qty 50

## 2014-11-05 MED ORDER — METRONIDAZOLE IN NACL 5-0.79 MG/ML-% IV SOLN
500.0000 mg | Freq: Three times a day (TID) | INTRAVENOUS | Status: DC
  Administered 2014-11-05 – 2014-11-08 (×9): 500 mg via INTRAVENOUS
  Filled 2014-11-05 (×14): qty 100

## 2014-11-05 NOTE — Progress Notes (Signed)
Patient ID: Andrea Keller, female   DOB: [birth year], 69 y.o.   MRN: [id number]      [facility]      [address] [place], [place] [address]    Phone: [phone] FAX: [phone]     Subjective: WBC down from 14.2 to 12k.  H&h stable.  Platelet count down to 46k.    Objective:  Vital signs:  Filed Vitals:   11/04/14 0600 11/04/14 1400 11/04/14 2113 11/05/14 0609  BP: 138/58 115/53 137/66 132/58  Pulse: 92 88 84 82  Temp: 99 F (37.2 C) 98.6 F (37 C) 98.9 F (37.2 C) 98.8 F (37.1 C)  TempSrc:      Resp: 16 18 18 18   Height:      Weight:      SpO2: 95% 96% 95% 96%    Last BM Date: 11/04/14  Intake/Output   Yesterday:  05/05 0701 - 05/06 0700 In: 240 [P.O.:240] Out: -  This shift:    I/O last 3 completed shifts: In: 480 [P.O.:480] Out: -      Physical Exam: General: Pt awake/alert/oriented x4 in no acute distress  Abdomen: Soft.  Nondistended.  Nontender.  Incisions are healing well.   No evidence of peritonitis.  No incarcerated hernias.    Problem List:   Principal Problem:   Acute appendicitis Active Problems:   Perforated appendicitis    Results:   Labs: Results for orders placed or performed during the hospital encounter of 10/28/14 (from the past 48 hour(s))  CBC     Status: Abnormal   Collection Time: 11/03/14  1:08 PM  Result Value Ref Range   WBC 14.7 (H) 4.0 - 10.5 K/uL   RBC 5.19 (H) 3.87 - 5.11 MIL/uL   Hemoglobin 11.9 (L) 12.0 - 15.0 g/dL   HCT 36.7 36.0 - 46.0 %   MCV 70.7 (L) 78.0 - 100.0 fL   MCH 22.9 (L) 26.0 - 34.0 pg   MCHC 32.4 30.0 - 36.0 g/dL   RDW 15.2 11.5 - 15.5 %   Platelets 78 (L) 150 - 400 K/uL    Comment: PLATELET COUNT CONFIRMED BY SMEAR  Basic metabolic panel     Status: Abnormal   Collection Time: 11/03/14  3:21 PM  Result Value Ref Range   Sodium 140 135 - 145 mmol/L   Potassium 3.6 3.5 - 5.1 mmol/L   Chloride 101 101 - 111 mmol/L   CO2 27 22 - 32  mmol/L   Glucose, Bld 125 (H) 70 - 99 mg/dL   BUN 9 6 - 20 mg/dL   Creatinine, Ser 1.04 (H) 0.44 - 1.00 mg/dL   Calcium 8.4 (L) 8.9 - 10.3 mg/dL   GFR calc non Af Amer 53 (L) >60 mL/min   GFR calc Af Amer >60 >60 mL/min    Comment: (NOTE) The eGFR has been calculated using the CKD EPI equation. This calculation has not been validated in all clinical situations. eGFR's persistently <90 mL/min signify possible Chronic Kidney Disease.    Anion gap 12 5 - 15  Clostridium Difficile by PCR     Status: None   Collection Time: 11/04/14 12:34 AM  Result Value Ref Range   C difficile by pcr NEGATIVE NEGATIVE  CBC     Status: Abnormal   Collection Time: 11/04/14  4:20 AM  Result Value Ref Range   WBC 14.2 (H) 4.0 - 10.5 K/uL    Comment: WHITE COUNT CONFIRMED ON  SMEAR REPEATED TO VERIFY    RBC 4.88 3.87 - 5.11 MIL/uL   Hemoglobin 11.2 (L) 12.0 - 15.0 g/dL   HCT 34.6 (L) 36.0 - 46.0 %   MCV 70.9 (L) 78.0 - 100.0 fL   MCH 23.0 (L) 26.0 - 34.0 pg   MCHC 32.4 30.0 - 36.0 g/dL   RDW 15.4 11.5 - 15.5 %   Platelets 51 (L) 150 - 400 K/uL    Comment: PLATELET COUNT CONFIRMED BY SMEAR REPEATED TO VERIFY   Basic metabolic panel     Status: Abnormal   Collection Time: 11/04/14  4:20 AM  Result Value Ref Range   Sodium 138 135 - 145 mmol/L   Potassium 3.6 3.5 - 5.1 mmol/L   Chloride 103 101 - 111 mmol/L   CO2 24 22 - 32 mmol/L   Glucose, Bld 99 70 - 99 mg/dL   BUN 9 6 - 20 mg/dL   Creatinine, Ser 1.07 (H) 0.44 - 1.00 mg/dL   Calcium 8.2 (L) 8.9 - 10.3 mg/dL   GFR calc non Af Amer 51 (L) >60 mL/min   GFR calc Af Amer 60 (L) >60 mL/min    Comment: (NOTE) The eGFR has been calculated using the CKD EPI equation. This calculation has not been validated in all clinical situations. eGFR's persistently <90 mL/min signify possible Chronic Kidney Disease.    Anion gap 11 5 - 15  Protime-INR     Status: None   Collection Time: 11/04/14  4:20 AM  Result Value Ref Range   Prothrombin Time 14.9  11.6 - 15.2 seconds   INR 1.16 0.00 - 1.49  CBC     Status: Abnormal   Collection Time: 11/05/14  4:57 AM  Result Value Ref Range   WBC 12.0 (H) 4.0 - 10.5 K/uL   RBC 4.50 3.87 - 5.11 MIL/uL   Hemoglobin 10.3 (L) 12.0 - 15.0 g/dL   HCT 31.7 (L) 36.0 - 46.0 %   MCV 70.4 (L) 78.0 - 100.0 fL   MCH 22.9 (L) 26.0 - 34.0 pg   MCHC 32.5 30.0 - 36.0 g/dL   RDW 15.3 11.5 - 15.5 %   Platelets 46 (L) 150 - 400 K/uL    Comment: SPECIMEN CHECKED FOR CLOTS REPEATED TO VERIFY CONSISTENT WITH PREVIOUS RESULT     Imaging / Studies: Ct Abdomen Pelvis W Contrast  11/03/2014   CLINICAL DATA:  Abdominal pain with nausea, vomiting and diarrhea status post appendectomy. Subsequent encounter.  EXAM: CT ABDOMEN AND PELVIS WITH CONTRAST  TECHNIQUE: Multidetector CT imaging of the abdomen and pelvis was performed using the standard protocol following bolus administration of intravenous contrast.  CONTRAST:  100 ml Omnipaque 300.  COMPARISON:  Abdominal pelvic CT 10/28/2014.  FINDINGS: Lower chest: There are enlarging right-greater-than-left pleural effusions with worsening dependent airspace opacities at both lung bases. Small pericardial effusion has also mildly enlarged.  Hepatobiliary: There is a stable 1.8 cm cyst centrally in the liver superior to the gallbladder. No other hepatic abnormalities are identified. However, there is a new lenticular shaped fluid collection anterior to the liver which appears subdiaphragmatic to on the reformatted images. This measures up to 5.9 cm transverse on image 17, but less than 2 cm in thickness. No evidence of gallstones, gallbladder wall thickening or biliary dilatation.  Pancreas: Unremarkable. No pancreatic ductal dilatation or surrounding inflammatory changes.  Spleen: Normal in size without focal abnormality.  Adrenals/Urinary Tract: Both adrenal glands appear normal.The kidneys appear normal without evidence of urinary  tract calculus, suspicious lesion or hydronephrosis. No  bladder abnormalities are seen. There is contrast material within the urinary bladder, suggesting preexisting contrast administration.  Stomach/Bowel: Interval appendectomy. There is a small fluid collection posterior to the cecal tip, measuring 2.8 cm maximally on axial image 71 and up to 4.5 cm in length. There is an additional lenticular shaped fluid collection along the anterior margin of the right colon, best seen on coronal image 46, measuring less than 2 cm in thickness. There is a 2 mm fluid collection in the pelvis, best seen on coronal image number 98.There is no generalized ascites or extravasated enteric contrast. There is some wall thickening of the terminal ileum. No evidence of bowel obstruction.  Vascular/Lymphatic: Small retroperitoneal and mesenteric lymph nodes are likely reactive. Stable aortoiliac atherosclerosis.  Reproductive: Status post hysterectomy.  No adnexal mass.  Other: No evidence of abdominal wall mass or hernia.  Musculoskeletal: No acute or significant osseous findings.  IMPRESSION: 1. Scattered intraperitoneal fluid collections status post interval appendectomy. These may reflect small abscesses, but no easily drainable collections are demonstrated at this time. There is a possible subcapsular component along the anterior margin of the liver. 2. Wall thickening of the terminal ileum, likely reactive. No evidence of bowel obstruction or perforation. 3. Enlarging bilateral pleural effusions and small pericardial effusion with worsening atelectasis at both lung bases.   Electronically Signed   By: Richardean Sale M.D.   On: 11/03/2014 20:44    Medications / Allergies:  Scheduled Meds: . amLODipine  10 mg Oral Daily  . ertapenem  1 g Intravenous Q24H  . ferrous sulfate  325 mg Oral Q breakfast  . rosuvastatin  10 mg Oral q1800   Continuous Infusions: . dextrose 5 % and 0.9 % NaCl with KCl 20 mEq/L 100 mL/hr at 10/29/14 0036   PRN Meds:.ondansetron **OR** ondansetron  (ZOFRAN) IV, oxyCODONE-acetaminophen  Antibiotics: Anti-infectives    Start     Dose/Rate Route Frequency Ordered Stop   11/04/14 1100  ertapenem (INVANZ) 1 g in sodium chloride 0.9 % 50 mL IVPB     1 g 100 mL/hr over 30 Minutes Intravenous Every 24 hours 11/04/14 0944     10/29/14 0000  piperacillin-tazobactam (ZOSYN) IVPB 3.375 g  Status:  Discontinued     3.375 g 12.5 mL/hr over 240 Minutes Intravenous Every 8 hours 10/28/14 2321 11/04/14 0944   10/28/14 1600  piperacillin-tazobactam (ZOSYN) IVPB 3.375 g     3.375 g 100 mL/hr over 30 Minutes Intravenous  Once 10/28/14 1556 10/28/14 1649      Assessment/Plan POD 8, s/p lap appy for perforated appendicitis, Goblet Cell Carcinoid  -CT 11-03-14 reveals multiple small interloop abscesses, too small to drain -WBC down to 12k, will continue with Invanz if able to tolerate.  -outpatient follow up already set up with Dr. Jyl Heinz at [facility] for 5-17 ID-zosyn total 6 days(Stopped 5/5) Colbert Ewing D#1 for perforated appendix and small interloop abscesses  Thrombocytopenia -continues to drop, Dr. Marin Olp consulted, appreciate consult.  Peripheral smear added -HIT panel pending FEN-regular diet, no issues DVT prophylaxis -SCDs.  lovenox stopped. Dispo -remain inpatient   Erby Pian, [facility] Surgery Pager [phone]) For consults and floor pages call [phone]([id number])  11/05/2014 9:42 AM

## 2014-11-05 NOTE — Consult Note (Signed)
[place]  Telephone:(336) [place]                                MR#: [id number]  DOB: 1946/06/22                        CSN#: [id number]  Patient Care Team: Nolene Ebbs, MD as PCP - General (Internal Medicine) Referring MD: [facility]    Reason for Consult: Thrombocytopenia  Andrea Keller 69 y.o. female admitted with 2 day history of diffuse abdominal pain, worse on the right lower quadrant, nausea and vomiting. A large cystocele was noted, which was reduced at the ED. CT of the abdomen and pelvis revealed acute appendicitis with extensive inflammatory change in the right lower quadrant and a small volume of perihepatic and free pelvic fluid. With initiation of anticoagulation with Lovenox, the patient  underwent Laparoscopic appendectomy on 4/29.was diagnosed with goblet cell carcinoid (see path report below. This was complicated with the formation of multiple small interloop abscesses per CT on 11/03/2014, too small to drain, requiring IV antibiotics, including IV Zosyn, Invanz. Her CBC on admission showed a platelet count from 187,000 dropping to 78,000 upon initiation of Lovenox, and then to 51,000, requiring discontinuation of Lovenox. HIT panel was ordered on 11/04/2014, which results are pending. Current platelet count is 46,000. She denies recent bruising or acute bleeding, such as spontaneous epistaxis, hematuria, melena or hematochezia. The patient denies history of liver disease, risk factors for HIV. Denies any  history of cardiac murmur. Denies recent new medications. ASA or NSAIDs.She denies prior blood or platelet transfusions.Patient never had a hematological evaluation prior to this admission. She never had a bone marrow biopsy. Denies any ticks or other insect bites.   PMH:  Past Medical History  Diagnosis Date  . Diverticulosis   . Hyperplastic colon polyp 2009  . Insomnia   .  Microcytosis   . Osteoarthritis   . PVD (peripheral vascular disease)   . Multiple thyroid nodules   . Hypertension     Surgeries:  Past Surgical History  Procedure Laterality Date  . Cyst removal neck  2009  . Abdominal hysterectomy with salpingooophorectomy  2009  . Laparoscopic appendectomy N/A 10/28/2014    Procedure: APPENDECTOMY LAPAROSCOPIC;  Surgeon: Erroll Luna, MD;  Location: [place];  Service: General;  Laterality: N/A;  . Appendectomy  10/28/2014    Allergies:  Allergies  Allergen Reactions  . Latex Rash    Medications:    prior to admission:  Prescriptions prior to admission  Medication Sig Dispense Refill Last Dose  . amLODipine (NORVASC) 10 MG tablet Take 10 mg by mouth daily.   10/27/2014 at Unknown time  . estradiol (ESTRACE) 0.1 MG/GM vaginal cream Place 1 Applicatorful vaginally 3 (three) times a week.   10/27/2014 at Unknown time  . ferrous sulfate 325 (65 FE) MG tablet Take 325 mg by mouth daily with breakfast.   10/27/2014 at Unknown time  . rosuvastatin (CRESTOR) 10 MG tablet Take 10 mg by mouth daily.   10/27/2014 at Unknown time    . amLODipine  10 mg Oral Daily  . ertapenem  1 g Intravenous Q24H  . ferrous sulfate  325 mg Oral Q breakfast  . rosuvastatin  10 mg Oral q1800    WIO:[id number] **OR** ondansetron (ZOFRAN) IV, oxyCODONE-acetaminophen  ROS: See HPI for significant positives, rest of ROS is negative.   Family History:    Family History  Problem Relation Age of Onset  . Colon cancer Brother   . Diabetes Mother   . Lung cancer Father   . Alcohol abuse      No family history of hematological  disorders.  Social History: She is retired, married with 2 children. She quitsmoking on April 01, 2010 and does not drink alcohol.Lives in [place]  Physical Exam    ECOG PERFORMANCE STATUS:2  Filed Vitals:   11/05/14 0609  BP: 132/58  Pulse: 82  Temp: 98.8 F (37.1 C)  Resp: 18   Filed Weights   10/28/14 0634  Weight:  165 lb (74.844 kg)    GENERAL:alert, no distress and comfortable SKIN: skin color, texture, turgor are normal, no rashes or significant lesions EYES: normal, conjunctiva are pink and non-injected, sclera clear OROPHARYNX:no exudate, no erythema and lips, buccal mucosa, and tongue normal  NECK: supple, thyroid normal size, non-tender, without nodularity LYMPH:  no palpable lymphadenopathy in the cervical, axillary or inguinal area LUNGS: Decreased breath sounds at the bases with normal breathing effort HEART: regular rate & rhythm and no murmurs and no lower extremity edema ABDOMEN: soft, Incisional tenderness, and normal bowel sounds Musculoskeletal:no cyanosis of digits and no clubbing  PSYCH: alert & oriented x 3 with fluent speech NEURO: no focal motor/sensory deficits   Labs:    Recent Labs Lab 10/30/14 0426 11/01/14 0555 11/03/14 1308 11/04/14 0420 11/05/14 0457  WBC 16.3* 14.4* 14.7* 14.2* 12.0*  HGB 12.1 10.9* 11.9* 11.2* 10.3*  HCT 38.5 34.3* 36.7 34.6* 31.7*  PLT 248 187 78* 51* 46*  MCV 72.0* 71.6* 70.7* 70.9* 70.4*  MCH 22.6* 22.8* 22.9* 23.0* 22.9*  MCHC 31.4 31.8 32.4 32.4 32.5  RDW 15.7* 15.6* 15.2 15.4 15.3        Recent Labs Lab 11/01/14 0555 11/03/14 1521 11/04/14 0420  NA 138 140 138  K 4.0 3.6 3.6  CL 104 101 103  CO2 25 27 24   GLUCOSE 97 125* 99  BUN 13 9 9   CREATININE 1.01* 1.04* 1.07*  CALCIUM 8.0* 8.4* 8.2*        Component Value Date/Time   BILITOT 0.6 10/29/2014 0407      Recent Labs Lab 11/04/14 0420  INR 1.16    No results for input(s): DDIMER in the last 72 hours.   Anemia panel:  No results for input(s): VITAMINB12, FOLATE, FERRITIN, TIBC, IRON, RETICCTPCT in the last 72 hours.  Urinalysis    Component Value Date/Time   COLORURINE YELLOW 10/28/2014 0830   APPEARANCEUR HAZY* 10/28/2014 0830   LABSPEC 1.021 10/28/2014 0830   PHURINE 5.5 10/28/2014 0830   GLUCOSEU NEGATIVE 10/28/2014 0830   HGBUR NEGATIVE  10/28/2014 0830   BILIRUBINUR NEGATIVE 10/28/2014 0830   KETONESUR 15* 10/28/2014 0830   PROTEINUR NEGATIVE 10/28/2014 0830   UROBILINOGEN 0.2 10/28/2014 0830   NITRITE NEGATIVE 10/28/2014 0830   LEUKOCYTESUR NEGATIVE 10/28/2014 0830    Drugs of Abuse  No results found for: LABOPIA, COCAINSCRNUR, LABBENZ, AMPHETMU, THCU, LABBARB   Imaging Studies:  US Transvaginal Non-ob  10/28/2014   CLINICAL DATA:  Abdominal and vaginal pain  EXAM: TRANSABDOMINAL AND TRANSVAGINAL ULTRASOUND OF PELVIS  TECHNIQUE: Both transabdominal and transvaginal ultrasound examinations of the pelvis were performed. Transabdominal technique was performed for global imaging of the pelvis including uterus, ovaries, adnexal regions, and pelvic cul-de-sac. It was necessary to proceed with endovaginal exam  following the transabdominal exam to visualize the adnexa.  COMPARISON:  09/17/2006  FINDINGS: Surgically absent uterus.  Negative vaginal cuff.  The ovaries, if remaining, cannot be visualized confidently. Thick walled tubular structure in the right lower quadrant, with small localized ascites (that is complex due to internal echoes) could reflect thick small bowel or appendix thickening.  IMPRESSION: 1. Suspect bowel thickening/inflammation in the right lower quadrant, where there is also small mildly complex ascites. Suggest contrast-enhanced abdominal CT in this patient with leukocytosis. 2. Hysterectomy. The ovaries, if remaining, could not be identified.   Electronically Signed   By: Monte Fantasia M.D.   On: 10/28/2014 12:28   US Pelvis Complete  10/28/2014   CLINICAL DATA:  Abdominal and vaginal pain  EXAM: TRANSABDOMINAL AND TRANSVAGINAL ULTRASOUND OF PELVIS  TECHNIQUE: Both transabdominal and transvaginal ultrasound examinations of the pelvis were performed. Transabdominal technique was performed for global imaging of the pelvis including uterus, ovaries, adnexal regions, and pelvic cul-de-sac. It was necessary to  proceed with endovaginal exam following the transabdominal exam to visualize the adnexa.  COMPARISON:  09/17/2006  FINDINGS: Surgically absent uterus.  Negative vaginal cuff.  The ovaries, if remaining, cannot be visualized confidently. Thick walled tubular structure in the right lower quadrant, with small localized ascites (that is complex due to internal echoes) could reflect thick small bowel or appendix thickening.  IMPRESSION: 1. Suspect bowel thickening/inflammation in the right lower quadrant, where there is also small mildly complex ascites. Suggest contrast-enhanced abdominal CT in this patient with leukocytosis. 2. Hysterectomy. The ovaries, if remaining, could not be identified.   Electronically Signed   By: Monte Fantasia M.D.   On: 10/28/2014 12:28   Ct Abdomen Pelvis W Contrast  11/03/2014   CLINICAL DATA:  Abdominal pain with nausea, vomiting and diarrhea status post appendectomy. Subsequent encounter.  EXAM: CT ABDOMEN AND PELVIS WITH CONTRAST  TECHNIQUE: Multidetector CT imaging of the abdomen and pelvis was performed using the standard protocol following bolus administration of intravenous contrast.  CONTRAST:  100 ml Omnipaque 300.  COMPARISON:  Abdominal pelvic CT 10/28/2014.  FINDINGS: Lower chest: There are enlarging right-greater-than-left pleural effusions with worsening dependent airspace opacities at both lung bases. Small pericardial effusion has also mildly enlarged.  Hepatobiliary: There is a stable 1.8 cm cyst centrally in the liver superior to the gallbladder. No other hepatic abnormalities are identified. However, there is a new lenticular shaped fluid collection anterior to the liver which appears subdiaphragmatic to on the reformatted images. This measures up to 5.9 cm transverse on image 17, but less than 2 cm in thickness. No evidence of gallstones, gallbladder wall thickening or biliary dilatation.  Pancreas: Unremarkable. No pancreatic ductal dilatation or surrounding  inflammatory changes.  Spleen: Normal in size without focal abnormality.  Adrenals/Urinary Tract: Both adrenal glands appear normal.The kidneys appear normal without evidence of urinary tract calculus, suspicious lesion or hydronephrosis. No bladder abnormalities are seen. There is contrast material within the urinary bladder, suggesting preexisting contrast administration.  Stomach/Bowel: Interval appendectomy. There is a small fluid collection posterior to the cecal tip, measuring 2.8 cm maximally on axial image 71 and up to 4.5 cm in length. There is an additional lenticular shaped fluid collection along the anterior margin of the right colon, best seen on coronal image 46, measuring less than 2 cm in thickness. There is a 2 mm fluid collection in the pelvis, best seen on coronal image number 98.There is no generalized ascites or extravasated enteric contrast. There is some  wall thickening of the terminal ileum. No evidence of bowel obstruction.  Vascular/Lymphatic: Small retroperitoneal and mesenteric lymph nodes are likely reactive. Stable aortoiliac atherosclerosis.  Reproductive: Status post hysterectomy.  No adnexal mass.  Other: No evidence of abdominal wall mass or hernia.  Musculoskeletal: No acute or significant osseous findings.  IMPRESSION: 1. Scattered intraperitoneal fluid collections status post interval appendectomy. These may reflect small abscesses, but no easily drainable collections are demonstrated at this time. There is a possible subcapsular component along the anterior margin of the liver. 2. Wall thickening of the terminal ileum, likely reactive. No evidence of bowel obstruction or perforation. 3. Enlarging bilateral pleural effusions and small pericardial effusion with worsening atelectasis at both lung bases.   Electronically Signed   By: Richardean Sale M.D.   On: 11/03/2014 20:44   Ct Abdomen Pelvis W Contrast  10/28/2014   CLINICAL DATA:  Abdominal and vaginal pain beginning 2 days  ago.  EXAM: CT ABDOMEN AND PELVIS WITH CONTRAST  TECHNIQUE: Multidetector CT imaging of the abdomen and pelvis was performed using the standard protocol following bolus administration of intravenous contrast.  CONTRAST:  117m OMNIPAQUE IOHEXOL 300 MG/ML  SOLN  COMPARISON:  None.  FINDINGS: Dependent atelectatic change is seen in the lung bases, worse on the right. A small amount pericardial fluid is identified and there are trace bilateral pleural effusions. Heart size is mildly enlarged. A small hiatal hernia is noted.  The appendix is markedly dilated at 2.0 cm with a small appendicolith identified. There is extensive stranding in the right lower quadrant of the abdomen and about small bowel loops in the right lower quadrant. Scattered colonic diverticula are seen but no evidence of diverticulitis is identified. Liquid stool is noted in the ascending colon.  A 1.8 cm hepatic cyst is noted on image 18. The liver is otherwise unremarkable. The gallbladder, spleen, adrenal glands, pancreas and kidneys appear normal.  Scattered aortoiliac atherosclerosis without aneurysm is noted. There is a small volume of free pelvic and perihepatic fluid. No lymphadenopathy is identified. No lytic or sclerotic bony lesion is seen.  IMPRESSION: The study is positive for acute appendicitis with extensive inflammatory change in the right lower quadrant and a small volume of perihepatic and free pelvic fluid. Findings called to Dr. MMalvin Johnsat the time of interpretation.  Diverticulosis without diverticulitis.  Small hiatal hernia.  Aortoiliac atherosclerosis without aneurysm.   Electronically Signed   By: TInge RiseM.D.   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 - PROXIMAL MARGIN, POSITIVE FOR  TUMOR    A/P: 69y.o. female with :   Thrombocytopenia:  This is likely related to recent anticoagulation, recent bleeding, infection, IV antibiotics, and  malignancy. Currently, no bleeding issues are reported. Consider transfusion of platelets it they are less than 10,000 or acutely bleeding. Lovenox was discontinued as of 11/04/2014. HIT panel is pending, will await for results. If HIT positive, confirmed by serotonin releasing assay (SRA), recommend to discontinue all heparin products permanently, and initiate alternative anticoagulation with non-heparin based agents such as Argatroban until the platelets rise to normal.  She would not be able to receive prednisone, as she is still healing from her surgery. Patient is currently on IV Zosyn and Invanz which could contribute to thrombocytopenia Blood smear has been ordered for review, rule out  schistocytes Discontinue Lovenox if platelet count is at 15,000  Check DIC panel as patient had infection with abscess.   If above workup negative, will follow up after his infection resolves, and consider bone marrow biopsy if her thrombocytopenia still persists. Will hold off biopsy for now.  Appendiceal cancer She was admitted with For dated appendicitis, with pathology positive for gallbladder left cell carcinoid, with proximal margin positive for tumor The patient would require Oncology follow-up Once discharged from the hospital, at the [facility], Dr. Burney Gauze, Which time further recommendations regarding treatment options to proceed  Leukocytosis This is likely reactive Blood smear has been ordered for review Continue to monitor. No intervention is indicated at this time.  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   **Disclaimer: This note was dictated with voice recognition software. Similar sounding words can inadvertently be transcribed and this note may contain transcription errors which may not have been corrected upon publication of note.** WERTMAN,SARA E, PA-C 11/05/2014 9:41 AM   ADDENDUM:  I agree with the above note.  I looked at her blood smear under the microscope. I do not see anything that looked suspicious. There were no schistocytes. She had no nucleated red cells. The white cells appeared mature. Platelets were decreased in number. However, she had several large platelets.  I have to believe that this thrombocytopenia is iatrogenic. When she came into the hospital, her platelet count was normal. It started to drop on September 4.  I agree with the HITT being sent. If this would be unusual for Lovenox but yet it is a possibility.  Antibiotics certainly could be playing a role. I think she may have been on Zosyn.  I don't see any on her physical exam that looks suspicious. There is no splenomegaly.  For now, I think her platelet count can just be watched. I have to believe that this will improve. If she had any type of infection with the appendix or with the surgery, she could have a consumptive or obstructive thrombocytopenia.  I would avoid aspirin for right now.  We will follow her along and make any other recommendations.  Her grandson was with her. I answered all their questions.  She is very nice.  Frederich Cha 1:5-7

## 2014-11-06 LAB — CBC
HCT: 29.9 % — ABNORMAL LOW (ref 36.0–46.0)
HEMOGLOBIN: 9.6 g/dL — AB (ref 12.0–15.0)
MCH: 22.8 pg — AB (ref 26.0–34.0)
MCHC: 32.1 g/dL (ref 30.0–36.0)
MCV: 71 fL — ABNORMAL LOW (ref 78.0–100.0)
PLATELETS: 59 10*3/uL — AB (ref 150–400)
RBC: 4.21 MIL/uL (ref 3.87–5.11)
RDW: 15.5 % (ref 11.5–15.5)
WBC: 13.3 10*3/uL — ABNORMAL HIGH (ref 4.0–10.5)

## 2014-11-06 NOTE — Progress Notes (Signed)
9 Days Post-Op  Subjective: No complaints   Objective: Vital signs in last 24 hours: Temp:  [98.1 F (36.7 C)-98.7 F (37.1 C)] 98.2 F (36.8 C) (05/07 0548) Pulse Rate:  [49-84] 74 (05/07 0548) Resp:  [18] 18 (05/07 0548) BP: (126-134)/(56-74) 126/57 mmHg (05/07 0548) SpO2:  [95 %-97 %] 96 % (05/07 0548) Last BM Date: 11/04/14  Intake/Output from previous day: 05/06 0701 - 05/07 0700 In: 760 [P.O.:760] Out: -  Intake/Output this shift: Total I/O In: 240 [P.O.:240] Out: -   Incision/Wound:soft non tender  Port sites ok  Lab Results:   Recent Labs  11/05/14 0457 11/05/14 1224 11/06/14 0532  WBC 12.0*  --  13.3*  HGB 10.3*  --  9.6*  HCT 31.7*  --  29.9*  PLT 46* 46* 59*   BMET  Recent Labs  11/03/14 1521 11/04/14 0420  NA 140 138  K 3.6 3.6  CL 101 103  CO2 27 24  GLUCOSE 125* 99  BUN 9 9  CREATININE 1.04* 1.07*  CALCIUM 8.4* 8.2*   PT/INR  Recent Labs  11/04/14 0420 11/05/14 1224  LABPROT 14.9 15.0  INR 1.16 1.17   ABG No results for input(s): PHART, HCO3 in the last 72 hours.  Invalid input(s): PCO2, PO2  Studies/Results: No results found.  Anti-infectives: Anti-infectives    Start     Dose/Rate Route Frequency Ordered Stop   11/05/14 1300  metroNIDAZOLE (FLAGYL) IVPB 500 mg     500 mg 100 mL/hr over 60 Minutes Intravenous Every 8 hours 11/05/14 1121     11/05/14 1200  cefTRIAXone (ROCEPHIN) 2 g in dextrose 5 % 50 mL IVPB - Premix     2 g 100 mL/hr over 30 Minutes Intravenous Every 24 hours 11/05/14 1121     11/04/14 1100  ertapenem (INVANZ) 1 g in sodium chloride 0.9 % 50 mL IVPB  Status:  Discontinued     1 g 100 mL/hr over 30 Minutes Intravenous Every 24 hours 11/04/14 0944 11/05/14 1121   10/29/14 0000  piperacillin-tazobactam (ZOSYN) IVPB 3.375 g  Status:  Discontinued     3.375 g 12.5 mL/hr over 240 Minutes Intravenous Every 8 hours 10/28/14 2321 11/04/14 0944   10/28/14 1600  piperacillin-tazobactam (ZOSYN) IVPB 3.375 g      3.375 g 100 mL/hr over 30 Minutes Intravenous  Once 10/28/14 1556 10/28/14 1649      Assessment/Plan: s/p Procedure(s): APPENDECTOMY LAPAROSCOPIC (N/A) Patient Active Problem List   Diagnosis Date Noted  . Acute appendicitis 10/28/2014  . Perforated appendicitis 10/28/2014  thrombocytopenia hematology following Appendiceal cancer  Will need [facility].  Continue ABX  For abscesses    LOS: 9 days    Andrea Iwai A. 11/06/2014

## 2014-11-07 LAB — CBC
HCT: 32 % — ABNORMAL LOW (ref 36.0–46.0)
Hemoglobin: 10.1 g/dL — ABNORMAL LOW (ref 12.0–15.0)
MCH: 22.5 pg — ABNORMAL LOW (ref 26.0–34.0)
MCHC: 31.6 g/dL (ref 30.0–36.0)
MCV: 71.4 fL — ABNORMAL LOW (ref 78.0–100.0)
PLATELETS: 74 10*3/uL — AB (ref 150–400)
RBC: 4.48 MIL/uL (ref 3.87–5.11)
RDW: 15.5 % (ref 11.5–15.5)
WBC: 13 10*3/uL — ABNORMAL HIGH (ref 4.0–10.5)

## 2014-11-07 NOTE — Progress Notes (Signed)
10 Days Post-Op  Subjective: FEELS OK  Objective: Vital signs in last 24 hours: Temp:  [98.1 F (36.7 C)-98.4 F (36.9 C)] 98.4 F (36.9 C) (05/08 0512) Pulse Rate:  [72-88] 72 (05/08 0512) Resp:  [16] 16 (05/08 0512) BP: (116-135)/(54-56) 118/55 mmHg (05/08 0512) SpO2:  [95 %-99 %] 95 % (05/08 0512) Last BM Date: 11/04/14  Intake/Output from previous day: 05/07 0701 - 05/08 0700 In: 1000 [P.O.:1000] Out: -  Intake/Output this shift:    Incision/Wound:CDI soft NT  Lab Results:   Recent Labs  11/06/14 0532 11/07/14 0502  WBC 13.3* 13.0*  HGB 9.6* 10.1*  HCT 29.9* 32.0*  PLT 59* 74*   BMET No results for input(s): NA, K, CL, CO2, GLUCOSE, BUN, CREATININE, CALCIUM in the last 72 hours. PT/INR  Recent Labs  11/05/14 1224  LABPROT 15.0  INR 1.17   ABG No results for input(s): PHART, HCO3 in the last 72 hours.  Invalid input(s): PCO2, PO2  Studies/Results: No results found.  Anti-infectives: Anti-infectives    Start     Dose/Rate Route Frequency Ordered Stop   11/05/14 1300  metroNIDAZOLE (FLAGYL) IVPB 500 mg     500 mg 100 mL/hr over 60 Minutes Intravenous Every 8 hours 11/05/14 1121     11/05/14 1200  cefTRIAXone (ROCEPHIN) 2 g in dextrose 5 % 50 mL IVPB - Premix     2 g 100 mL/hr over 30 Minutes Intravenous Every 24 hours 11/05/14 1121     11/04/14 1100  ertapenem (INVANZ) 1 g in sodium chloride 0.9 % 50 mL IVPB  Status:  Discontinued     1 g 100 mL/hr over 30 Minutes Intravenous Every 24 hours 11/04/14 0944 11/05/14 1121   10/29/14 0000  piperacillin-tazobactam (ZOSYN) IVPB 3.375 g  Status:  Discontinued     3.375 g 12.5 mL/hr over 240 Minutes Intravenous Every 8 hours 10/28/14 2321 11/04/14 0944   10/28/14 1600  piperacillin-tazobactam (ZOSYN) IVPB 3.375 g     3.375 g 100 mL/hr over 30 Minutes Intravenous  Once 10/28/14 1556 10/28/14 1649      Assessment/Plan: s/p Procedure(s): APPENDECTOMY LAPAROSCOPIC (N/A) Thrombocytopenia  better Interloop abscesses Continue antibiotics Counts better  Maybe home on PO abx next couple of days Needs to follow up at [facility]  For perforated appendiceal CA  LOS: 10 days    Anne Boltz A. 11/07/2014

## 2014-11-08 ENCOUNTER — Encounter (HOSPITAL_COMMUNITY): Payer: Self-pay | Admitting: Orthopedic Surgery

## 2014-11-08 DIAGNOSIS — D696 Thrombocytopenia, unspecified: Secondary | ICD-10-CM | POA: Diagnosis not present

## 2014-11-08 DIAGNOSIS — C181 Malignant neoplasm of appendix: Secondary | ICD-10-CM

## 2014-11-08 HISTORY — DX: Malignant neoplasm of appendix: C18.1

## 2014-11-08 LAB — CBC
HEMATOCRIT: 30.4 % — AB (ref 36.0–46.0)
HEMOGLOBIN: 9.6 g/dL — AB (ref 12.0–15.0)
MCH: 22.5 pg — ABNORMAL LOW (ref 26.0–34.0)
MCHC: 31.6 g/dL (ref 30.0–36.0)
MCV: 71.2 fL — ABNORMAL LOW (ref 78.0–100.0)
Platelets: 118 10*3/uL — ABNORMAL LOW (ref 150–400)
RBC: 4.27 MIL/uL (ref 3.87–5.11)
RDW: 15.5 % (ref 11.5–15.5)
WBC: 13.2 10*3/uL — ABNORMAL HIGH (ref 4.0–10.5)

## 2014-11-08 MED ORDER — METRONIDAZOLE 500 MG PO TABS: 500.0000 mg | ORAL_TABLET | Freq: Three times a day (TID) | ORAL | Status: DC

## 2014-11-08 MED ORDER — CIPROFLOXACIN HCL 500 MG PO TABS: 500.0000 mg | ORAL_TABLET | Freq: Two times a day (BID) | ORAL | Status: AC

## 2014-11-08 MED ORDER — OXYCODONE-ACETAMINOPHEN 5-325 MG PO TABS: 1.0000 | ORAL_TABLET | ORAL | Status: DC | PRN

## 2014-11-08 NOTE — Discharge Summary (Signed)
Physician Discharge Summary  Patient ID: Andrea Keller MRN: [id number] DOB/AGE: 70-Nov-1947 70 y.o.  Admit date: 10/28/2014 Discharge date: 11/08/2014  Discharge Diagnoses Patient Active Problem List   Diagnosis Date Noted  . 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 Thrombocytopenia 11/08/2014  . Acute appendicitis 10/28/2014  . Perforated appendicitis 10/28/2014    Consultants Dr. Burney Gauze for hematology   Procedures 4/28 -- Laparoscopic appendectomy by Dr. Erroll Luna   HPI: Andrea Keller presented to the emergency department with complaints of "something coming out of her vagina" and abdominal pain. The patient began having abdominal pain 2 mornings ago. She had nausea and vomiting as well as some diarrhea. She denied any fevers but admitted to chills. Her pain had been diffuse but is greatest in her right lower quadrant. Upon arrival she was noted to have a large cystocele. This was reduced by the emergency department physician. A Foley catheter was then placed. Due to persistent pain a CT scan of abdomen and pelvis was ordered. This revealed acute appendicitis with extensive inflammatory change in the right lower quadrant and a small volume of perihepatic and free pelvic fluid.She was admitted and taken to the OR for appendectomy.   Hospital Course: The patient had a prolonged post-operative course. This was mainly due to a prolonged ileus. She also developed a leukocytosis and a repeat CT scan revealed a small undrainable fluid collection. Her antibiotics were changed from Zosyn to [facility] but she seemed to have an adverse reaction to it and was changed again to Rocephin and Flagyl. Her fevers abated but she continued to have a mildly elevated leukocytosis that was stable. She also developed an acute thrombocytopenia. Hematology was consulted and her Lovenox was stopped. Her platelets rebounded in a couple of days. Her appendiceal pathology came back showing goblet cell carcinoid with  positive margins. By post-operative day 10 she was tolerating a regular diet and had minimal pain and was discharged home in good condition.     Medication List    TAKE these medications        amLODipine 10 MG tablet  Commonly known as:  NORVASC  Take 10 mg by mouth daily.     ciprofloxacin 500 MG tablet  Commonly known as:  CIPRO  Take 1 tablet (500 mg total) by mouth 2 (two) times daily.     estradiol 0.1 MG/GM vaginal cream  Commonly known as:  ESTRACE  Place 1 Applicatorful vaginally 3 (three) times a week.     ferrous sulfate 325 (65 FE) MG tablet  Take 325 mg by mouth daily with breakfast.     metroNIDAZOLE 500 MG tablet  Commonly known as:  FLAGYL  Take 1 tablet (500 mg total) by mouth 3 (three) times daily.     oxyCODONE-acetaminophen 5-325 MG per tablet  Commonly known as:  PERCOCET/ROXICET  Take 1-2 tablets by mouth every 4 (four) hours as needed for moderate pain.     rosuvastatin 10 MG tablet  Commonly known as:  CRESTOR  Take 10 mg by mouth daily.            Follow-up Information    Follow up with Jyl Heinz, MD On 11/16/2014.   Specialty:  Surgery   Why:  1:30pm, arrive by 1:10pm for check-in   Contact information:   [place] [address] [phone]       Follow up with Turner Daniels., MD.   Specialty:  General Surgery   Why:  our office will call you   Contact information:   [address] [place] [address] [phone]        Signed: Bryn Gulling Pager: [phone] 11/08/2014, 10:41 AM

## 2014-11-08 NOTE — Progress Notes (Signed)
Andrea Keller   DOB:[birth year]   BS#:[id number]   UTM#:[id number]  Patient Care Team: Nolene Ebbs, MD as PCP - General (Internal Medicine)  Subjective: Doing well today, no complaints.  No respiratory or cardiac issues. Minimal abdominal pain.No bleeding issues. Likely being discharged today   Scheduled Meds: . amLODipine  10 mg Oral Daily  . cefTRIAXone (ROCEPHIN)  IV  2 g Intravenous Q24H  . metronidazole  500 mg Intravenous Q8H  . rosuvastatin  10 mg Oral q1800   Continuous Infusions: . dextrose 5 % and 0.9 % NaCl with KCl 20 mEq/L 50 mL/hr (11/05/14 2028)   PRN Meds:ondansetron **OR** ondansetron (ZOFRAN) IV, oxyCODONE-acetaminophen   Objective:  Filed Vitals:   11/08/14 0530  BP: 123/49  Pulse: 73  Temp: 98.4 F (36.9 C)  Resp: 16      Intake/Output Summary (Last 24 hours) at 11/08/14 1323 Last data filed at 11/07/14 1700  Gross per 24 hour  Intake    480 ml  Output      0 ml  Net    480 ml     GENERAL:alert, no distress and comfortable SKIN: skin color, texture, turgor are normal, no rashes or significant lesions EYES: normal, conjunctiva are pink and non-injected, sclera clear OROPHARYNX:no exudate, no erythema and lips, buccal mucosa, and tongue normal  NECK: supple, thyroid normal size, non-tender, without nodularity LYMPH:  no palpable lymphadenopathy in the cervical, axillary or inguinal LUNGS: clear to auscultation and percussion with normal breathing effort HEART: regular rate & rhythm and no murmurs and no lower extremity edema ABDOMEN: soft, minimal incisional tenderness and normal bowel sounds Musculoskeletal:no cyanosis of digits and no clubbing  PSYCH: alert & oriented x 3 with fluent speech NEURO: no focal motor/sensory deficits    CBG (last 3)  No results for input(s): GLUCAP in the last 72 hours.   Labs:   Recent Labs Lab 11/04/14 0420 11/05/14 0457 11/05/14 1224 11/06/14 0532 11/07/14 0502 11/08/14 0511  WBC 14.2* 12.0*  --   13.3* 13.0* 13.2*  HGB 11.2* 10.3*  --  9.6* 10.1* 9.6*  HCT 34.6* 31.7*  --  29.9* 32.0* 30.4*  PLT 51* 46* 46* 59* 74* 118*  MCV 70.9* 70.4*  --  71.0* 71.4* 71.2*  MCH 23.0* 22.9*  --  22.8* 22.5* 22.5*  MCHC 32.4 32.5  --  32.1 31.6 31.6  RDW 15.4 15.3  --  15.5 15.5 15.5     Chemistries:    Recent Labs Lab 11/03/14 1521 11/04/14 0420  NA 140 138  K 3.6 3.6  CL 101 103  CO2 27 24  GLUCOSE 125* 99  BUN 9 9  CREATININE 1.04* 1.07*  CALCIUM 8.4* 8.2*    GFR Estimated Creatinine Clearance: 47.4 mL/min (by C-G formula based on Cr of 1.07).   Urine Studies     Component Value Date/Time   COLORURINE YELLOW 10/28/2014 0830   APPEARANCEUR HAZY* 10/28/2014 0830   LABSPEC 1.021 10/28/2014 0830   PHURINE 5.5 10/28/2014 0830   GLUCOSEU NEGATIVE 10/28/2014 0830   HGBUR NEGATIVE 10/28/2014 0830   BILIRUBINUR NEGATIVE 10/28/2014 0830   KETONESUR 15* 10/28/2014 0830   PROTEINUR NEGATIVE 10/28/2014 0830   UROBILINOGEN 0.2 10/28/2014 0830   NITRITE NEGATIVE 10/28/2014 0830   LEUKOCYTESUR NEGATIVE 10/28/2014 0830    Coagulation profile  Recent Labs Lab 11/04/14 0420 11/05/14 1224  INR 1.16 1.17        Imaging Studies:  No results found.  Assessment/Plan: 69 y.o.  Thrombocytopenia:  This is likely acute,  related to recent anticoagulation, recent bleeding, infection, IV antibiotics, and malignancy.  This has improved, today at 118,000 HIT pending.   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  Anemia in neoplastic disease In the setting of malignancy, dilution, IV antibiotics, infection, and acute blood loss due to surgery She was on oral Iron as outpatient, continue present dose No intervention is  indicated at this time, as such as transfusions  DVT prophylaxis Lovenox has been d/c's, she is now on SCDs  Other medical issues as per the admitting team.    **Disclaimer: This note was dictated with voice recognition software. Similar sounding words can inadvertently be transcribed and this note may contain transcription errors which may not have been corrected upon publication of note.** WERTMAN,SARA E, PA-C 11/08/2014  1:23 PM  ADDENDUM:  i agree with the above note.  The platelets are coming up nicely.  i still feel this is iatrogenic and likely from antibiotics and possible a little sepsis.  Her blood smear has looked ok.  I do not think that we need to follow her up as an outpatient.  Lum Keas

## 2014-11-08 NOTE — Progress Notes (Signed)
Patient ID: Andrea Keller, female   DOB: 03-27-1946, 69 y.o.   MRN: [id number]   LOS: 11 days   Subjective: Feeling better. Minimal pain, NL BM this morning, no N/V.   Objective: Vital signs in last 24 hours: Temp:  [98.3 F (36.8 C)-98.4 F (36.9 C)] 98.4 F (36.9 C) (05/09 0530) Pulse Rate:  [73-84] 73 (05/09 0530) Resp:  [16] 16 (05/09 0530) BP: (123-136)/(49-63) 123/49 mmHg (05/09 0530) SpO2:  [97 %] 97 % (05/09 0530) Last BM Date: 11/04/14   Laboratory  CBC  Recent Labs  11/07/14 0502 11/08/14 0511  WBC 13.0* 13.2*  HGB 10.1* 9.6*  HCT 32.0* 30.4*  PLT 74* 118*    Physical Exam General appearance: alert and no distress Resp: clear to auscultation bilaterally Cardio: regular rate and rhythm GI: normal findings: bowel sounds normal, soft, non-tender and port sites WNL   Assessment/Plan: POD 11, s/p lap appy for perforated appendicitis, Goblet Cell Carcinoid  -CT 11-03-14 reveals multiple small interloop abscesses, too small to drain -WBC stable at 13k -outpatient follow up already set up with Dr. Jyl Heinz at [facility] for 5-17 ID- Change to cipro/flagyl for OP  Thrombocytopenia - Resolved -HIT panel pending FEN-regular diet, no issues DVT prophylaxis -SCDs. lovenox stopped. Dispo D/C home    Lisette Abu, PA-C Pager: [phone] 11/08/2014

## 2014-11-09 LAB — SEROTONIN RELEASE ASSAY (SRA): SRA 100IU/mL UFH Ser-aCnc: <1 % (ref 0–20)

## 2014-11-09 LAB — HEPARIN INDUCED THROMBOCYTOPENIA PNL: HEPARIN INDUCED PLT AB: 0.405 {OD_unit} — AB (ref 0.000–0.400)

## 2015-03-30 ENCOUNTER — Encounter: Payer: Self-pay | Admitting: Internal Medicine

## 2015-05-31 ENCOUNTER — Other Ambulatory Visit: Payer: Self-pay

## 2015-05-31 DIAGNOSIS — Z1231 Encounter for screening mammogram for malignant neoplasm of breast: Secondary | ICD-10-CM

## 2015-06-22 ENCOUNTER — Ambulatory Visit
Admission: RE | Admit: 2015-06-22 | Discharge: 2015-06-22 | Disposition: A | Discharge status: Home / Self Care | Payer: Medicare Other | Source: Ambulatory Visit

## 2015-06-22 DIAGNOSIS — Z1231 Encounter for screening mammogram for malignant neoplasm of breast: Secondary | ICD-10-CM

## 2015-09-10 ENCOUNTER — Emergency Department (HOSPITAL_COMMUNITY): Payer: Medicare Other

## 2015-09-10 ENCOUNTER — Encounter (HOSPITAL_COMMUNITY): Payer: Self-pay | Admitting: Emergency Medicine

## 2015-09-10 ENCOUNTER — Inpatient Hospital Stay (HOSPITAL_COMMUNITY)
Admission: EM | Admit: 2015-09-10 | Discharge: 2015-09-14 | DRG: 086 | Disposition: A | Discharge status: Rehabilitation Facility | Payer: Medicare Other | Attending: Internal Medicine | Admitting: Internal Medicine

## 2015-09-10 DIAGNOSIS — S066X0A Traumatic subarachnoid hemorrhage without loss of consciousness, initial encounter: Secondary | ICD-10-CM | POA: Diagnosis present

## 2015-09-10 DIAGNOSIS — D509 Iron deficiency anemia, unspecified: Secondary | ICD-10-CM | POA: Diagnosis present

## 2015-09-10 DIAGNOSIS — Z91040 Latex allergy status: Secondary | ICD-10-CM | POA: Diagnosis not present

## 2015-09-10 DIAGNOSIS — N179 Acute kidney failure, unspecified: Secondary | ICD-10-CM | POA: Diagnosis present

## 2015-09-10 DIAGNOSIS — R718 Other abnormality of red blood cells: Secondary | ICD-10-CM | POA: Diagnosis not present

## 2015-09-10 DIAGNOSIS — D62 Acute posthemorrhagic anemia: Secondary | ICD-10-CM | POA: Diagnosis present

## 2015-09-10 DIAGNOSIS — Z85038 Personal history of other malignant neoplasm of large intestine: Secondary | ICD-10-CM | POA: Diagnosis not present

## 2015-09-10 DIAGNOSIS — C189 Malignant neoplasm of colon, unspecified: Secondary | ICD-10-CM | POA: Insufficient documentation

## 2015-09-10 DIAGNOSIS — R748 Abnormal levels of other serum enzymes: Secondary | ICD-10-CM | POA: Diagnosis present

## 2015-09-10 DIAGNOSIS — I10 Essential (primary) hypertension: Secondary | ICD-10-CM | POA: Diagnosis present

## 2015-09-10 DIAGNOSIS — I612 Nontraumatic intracerebral hemorrhage in hemisphere, unspecified: Secondary | ICD-10-CM | POA: Diagnosis not present

## 2015-09-10 DIAGNOSIS — Z9049 Acquired absence of other specified parts of digestive tract: Secondary | ICD-10-CM | POA: Diagnosis not present

## 2015-09-10 DIAGNOSIS — I671 Cerebral aneurysm, nonruptured: Secondary | ICD-10-CM | POA: Diagnosis present

## 2015-09-10 DIAGNOSIS — R404 Transient alteration of awareness: Secondary | ICD-10-CM | POA: Diagnosis not present

## 2015-09-10 DIAGNOSIS — Z9221 Personal history of antineoplastic chemotherapy: Secondary | ICD-10-CM | POA: Diagnosis not present

## 2015-09-10 DIAGNOSIS — I6789 Other cerebrovascular disease: Secondary | ICD-10-CM | POA: Diagnosis not present

## 2015-09-10 DIAGNOSIS — Z79899 Other long term (current) drug therapy: Secondary | ICD-10-CM

## 2015-09-10 DIAGNOSIS — Z800 Family history of malignant neoplasm of digestive organs: Secondary | ICD-10-CM | POA: Diagnosis not present

## 2015-09-10 DIAGNOSIS — W06XXXD Fall from bed, subsequent encounter: Secondary | ICD-10-CM | POA: Diagnosis not present

## 2015-09-10 DIAGNOSIS — W06XXXA Fall from bed, initial encounter: Secondary | ICD-10-CM | POA: Diagnosis present

## 2015-09-10 DIAGNOSIS — R7989 Other specified abnormal findings of blood chemistry: Secondary | ICD-10-CM

## 2015-09-10 DIAGNOSIS — I611 Nontraumatic intracerebral hemorrhage in hemisphere, cortical: Secondary | ICD-10-CM | POA: Diagnosis not present

## 2015-09-10 DIAGNOSIS — I739 Peripheral vascular disease, unspecified: Secondary | ICD-10-CM

## 2015-09-10 DIAGNOSIS — S06340D Traumatic hemorrhage of right cerebrum without loss of consciousness, subsequent encounter: Secondary | ICD-10-CM | POA: Diagnosis present

## 2015-09-10 DIAGNOSIS — D696 Thrombocytopenia, unspecified: Secondary | ICD-10-CM | POA: Diagnosis not present

## 2015-09-10 DIAGNOSIS — C181 Malignant neoplasm of appendix: Secondary | ICD-10-CM | POA: Diagnosis present

## 2015-09-10 DIAGNOSIS — C801 Malignant (primary) neoplasm, unspecified: Secondary | ICD-10-CM | POA: Diagnosis not present

## 2015-09-10 DIAGNOSIS — E785 Hyperlipidemia, unspecified: Secondary | ICD-10-CM | POA: Insufficient documentation

## 2015-09-10 DIAGNOSIS — Z923 Personal history of irradiation: Secondary | ICD-10-CM | POA: Diagnosis not present

## 2015-09-10 DIAGNOSIS — R778 Other specified abnormalities of plasma proteins: Secondary | ICD-10-CM | POA: Insufficient documentation

## 2015-09-10 DIAGNOSIS — I619 Nontraumatic intracerebral hemorrhage, unspecified: Secondary | ICD-10-CM | POA: Diagnosis not present

## 2015-09-10 DIAGNOSIS — I639 Cerebral infarction, unspecified: Secondary | ICD-10-CM

## 2015-09-10 DIAGNOSIS — Z87891 Personal history of nicotine dependence: Secondary | ICD-10-CM | POA: Diagnosis not present

## 2015-09-10 DIAGNOSIS — S06340S Traumatic hemorrhage of right cerebrum without loss of consciousness, sequela: Secondary | ICD-10-CM | POA: Diagnosis not present

## 2015-09-10 DIAGNOSIS — G441 Vascular headache, not elsewhere classified: Secondary | ICD-10-CM | POA: Diagnosis not present

## 2015-09-10 DIAGNOSIS — I6340 Cerebral infarction due to embolism of unspecified cerebral artery: Secondary | ICD-10-CM | POA: Diagnosis not present

## 2015-09-10 DIAGNOSIS — Z85030 Personal history of malignant carcinoid tumor of large intestine: Secondary | ICD-10-CM | POA: Diagnosis not present

## 2015-09-10 LAB — CBC WITH DIFFERENTIAL/PLATELET
Basophils Absolute: 0.1 K/uL (ref 0.0–0.1)
Basophils Relative: 1 %
Eosinophils Absolute: 0.2 K/uL (ref 0.0–0.7)
Eosinophils Relative: 3 %
HCT: 33.1 % — ABNORMAL LOW (ref 36.0–46.0)
Hemoglobin: 10.7 g/dL — ABNORMAL LOW (ref 12.0–15.0)
Lymphocytes Relative: 35 %
Lymphs Abs: 2.1 K/uL (ref 0.7–4.0)
MCH: 24.1 pg — ABNORMAL LOW (ref 26.0–34.0)
MCHC: 32.3 g/dL (ref 30.0–36.0)
MCV: 74.5 fL — ABNORMAL LOW (ref 78.0–100.0)
Monocytes Absolute: 1 K/uL (ref 0.1–1.0)
Monocytes Relative: 16 %
Neutro Abs: 2.7 K/uL (ref 1.7–7.7)
Neutrophils Relative %: 45 %
Platelets: 154 K/uL (ref 150–400)
RBC: 4.44 MIL/uL (ref 3.87–5.11)
RDW: 15.7 % — ABNORMAL HIGH (ref 11.5–15.5)
WBC: 6 K/uL (ref 4.0–10.5)

## 2015-09-10 LAB — COMPREHENSIVE METABOLIC PANEL WITH GFR
ALT: 15 U/L (ref 14–54)
AST: 21 U/L (ref 15–41)
Albumin: 3.5 g/dL (ref 3.5–5.0)
Alkaline Phosphatase: 75 U/L (ref 38–126)
Anion gap: 11 (ref 5–15)
BUN: 15 mg/dL (ref 6–20)
CO2: 26 mmol/L (ref 22–32)
Calcium: 9.4 mg/dL (ref 8.9–10.3)
Chloride: 103 mmol/L (ref 101–111)
Creatinine, Ser: 1.03 mg/dL — ABNORMAL HIGH (ref 0.44–1.00)
GFR calc Af Amer: >60 mL/min
GFR calc non Af Amer: 54 mL/min — ABNORMAL LOW
Glucose, Bld: 113 mg/dL — ABNORMAL HIGH (ref 65–99)
Potassium: 4.8 mmol/L (ref 3.5–5.1)
Sodium: 140 mmol/L (ref 135–145)
Total Bilirubin: 0.3 mg/dL (ref 0.3–1.2)
Total Protein: 7 g/dL (ref 6.5–8.1)

## 2015-09-10 MED ORDER — GADOBENATE DIMEGLUMINE 529 MG/ML IV SOLN
10.0000 mL | Freq: Once | INTRAVENOUS | Status: AC | PRN
  Administered 2015-09-10: 10 mL via INTRAVENOUS

## 2015-09-10 NOTE — ED Notes (Signed)
Patient transported to MRI 

## 2015-09-10 NOTE — Consult Note (Signed)
Reason for Consult: Right frontal hemorrhagic mass Referring Physician: Emergency department  Andrea Keller is an 70 y.o. female.  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 Patient was brought to the ER underwent head CT which showed hemorrhagic mass right frontal lobe and has been referred for MRI scan. Currently the patient is complaining of a mild headache and some confusion otherwise nonfocal. Reports a syncopal episode but doesn't know what caused it.  Past Medical History  Diagnosis Date  . Diverticulosis   . Hyperplastic colon polyp   . Insomnia   . Microcytosis   . Osteoarthritis   . PVD (peripheral vascular disease) ([place])   . Multiple thyroid nodules   . Hypertension   . Goblet cell carcinoid ([place]) 11/08/2014    Past Surgical History  Procedure Laterality Date  . Cyst removal neck    . Abdominal hysterectomy  2009  . Laparoscopic appendectomy N/A 10/28/2014    Procedure: APPENDECTOMY LAPAROSCOPIC;  Surgeon: Erroll Luna, MD;  Location: [place];  Service: General;  Laterality: N/A;  . Appendectomy  10/28/2014    Family History  Problem Relation Age of Onset  . Colon cancer Brother   . Diabetes Mother   . Lung cancer Father   . Alcohol abuse      Social History:  reports that she has quit smoking. She has never used smokeless tobacco. She reports that she does not drink alcohol or use illicit drugs.  Allergies:  Allergies  Allergen Reactions  . Latex Rash    Medications: I have reviewed the patient's current medications.  Results for orders placed or performed during the hospital encounter of 09/10/15 (from the past 48 hour(s))  Comprehensive metabolic panel     Status: Abnormal   Collection Time: 09/10/15  8:22 PM  Result Value Ref Range   Sodium 140 135 - 145 mmol/L   Potassium  4.8 3.5 - 5.1 mmol/L   Chloride 103 101 - 111 mmol/L   CO2 26 22 - 32 mmol/L   Glucose, Bld 113 (H) 65 - 99 mg/dL   BUN 15 6 - 20 mg/dL   Creatinine, Ser 1.03 (H) 0.44 - 1.00 mg/dL   Calcium 9.4 8.9 - 10.3 mg/dL   Total Protein 7.0 6.5 - 8.1 g/dL   Albumin 3.5 3.5 - 5.0 g/dL   AST 21 15 - 41 U/L   ALT 15 14 - 54 U/L   Alkaline Phosphatase 75 38 - 126 U/L   Total Bilirubin 0.3 0.3 - 1.2 mg/dL   GFR calc non Af Amer 54 (L) >60 mL/min   GFR calc Af Amer >60 >60 mL/min    Comment: (NOTE) The eGFR has been calculated using the CKD EPI equation. This calculation has not been validated in all clinical situations. eGFR's persistently <60 mL/min signify possible Chronic Kidney Disease.    Anion gap 11 5 - 15  CBC with Differential     Status: Abnormal   Collection Time: 09/10/15  8:22 PM  Result Value Ref Range   WBC 6.0 4.0 - 10.5 K/uL   RBC 4.44 3.87 - 5.11 MIL/uL   Hemoglobin 10.7 (L) 12.0 - 15.0 g/dL   HCT 33.1 (L) 36.0 - 46.0 %   MCV 74.5 (L) 78.0 - 100.0 fL   MCH 24.1 (L) 26.0 - 34.0 pg   MCHC 32.3 30.0 -  36.0 g/dL   RDW 15.7 (H) 11.5 - 15.5 %   Platelets 154 150 - 400 K/uL   Neutrophils Relative % 45 %   Neutro Abs 2.7 1.7 - 7.7 K/uL   Lymphocytes Relative 35 %   Lymphs Abs 2.1 0.7 - 4.0 K/uL   Monocytes Relative 16 %   Monocytes Absolute 1.0 0.1 - 1.0 K/uL   Eosinophils Relative 3 %   Eosinophils Absolute 0.2 0.0 - 0.7 K/uL   Basophils Relative 1 %   Basophils Absolute 0.1 0.0 - 0.1 K/uL    Ct Head Wo Contrast  09/10/2015  ADDENDUM REPORT: 09/10/2015 20:50 ADDENDUM: The PA taking care the patient returned my call. Results were given verbally on 09/10/2015 at 8:49 pm to [facility] , who verbally acknowledged these results. Electronically Signed   By: Rolm Baptise M.D.   On: 09/10/2015 20:50  09/10/2015  CLINICAL DATA:  Fall last night, head injury. EXAM: CT HEAD WITHOUT CONTRAST CT CERVICAL SPINE WITHOUT CONTRAST TECHNIQUE: Multidetector CT imaging of the head and  cervical spine was performed following the standard protocol without intravenous contrast. Multiplanar CT image reconstructions of the cervical spine were also generated. COMPARISON:  None. FINDINGS: CT HEAD FINDINGS There is an area of hemorrhage within the right frontal lobe measuring up to 2.5 cm. There is extensive subcortical and deep white matter hypodensity throughout the right cerebral hemisphere, which appears to be out of proportion to the bleed. Also, some areas of hypodensity are remote to the bleed. I cannot exclude underlying mass lesions within the right cerebral hemisphere. No significant midline shift. No hydrocephalus. No acute calvarial abnormality or scalp swelling. Visualized paranasal sinuses and mastoids clear. Orbital soft tissues unremarkable. CT CERVICAL SPINE FINDINGS Alignment is normal. Prevertebral soft tissues are normal. Early degenerative spurring and disc space narrowing. Mild degenerative facet disease diffusely. No fracture. No epidural or paraspinal hematoma. No acute spreads that IMPRESSION: 2.5 cm hemorrhage within the right frontal lobe with extensive white matter hypodensity throughout the right cerebral hemisphere. While this could be posttraumatic, given the degree of hypodensity throughout the right cerebral hemisphere, I cannot exclude underlying brain lesions. Recommend further evaluation with MRI with and without contrast. No acute bony abnormality in the cervical spine. Attempted to call the Dr. Lillette Boxer care of this patient. She instructed me that she would have to call me back shortly. Electronically Signed: By: Rolm Baptise M.D. On: 09/10/2015 20:28   Ct Cervical Spine Wo Contrast  09/10/2015  ADDENDUM REPORT: 09/10/2015 20:50 ADDENDUM: The PA taking care the patient returned my call. Results were given verbally on 09/10/2015 at 8:49 pm to [facility] , who verbally acknowledged these results. Electronically Signed   By: Rolm Baptise M.D.   On: 09/10/2015  20:50  09/10/2015  CLINICAL DATA:  Fall last night, head injury. EXAM: CT HEAD WITHOUT CONTRAST CT CERVICAL SPINE WITHOUT CONTRAST TECHNIQUE: Multidetector CT imaging of the head and cervical spine was performed following the standard protocol without intravenous contrast. Multiplanar CT image reconstructions of the cervical spine were also generated. COMPARISON:  None. FINDINGS: CT HEAD FINDINGS There is an area of hemorrhage within the right frontal lobe measuring up to 2.5 cm. There is extensive subcortical and deep white matter hypodensity throughout the right cerebral hemisphere, which appears to be out of proportion to the bleed. Also, some areas of hypodensity are remote to the bleed. I cannot exclude underlying mass lesions within the right cerebral hemisphere. No significant midline shift. No hydrocephalus.  No acute calvarial abnormality or scalp swelling. Visualized paranasal sinuses and mastoids clear. Orbital soft tissues unremarkable. CT CERVICAL SPINE FINDINGS Alignment is normal. Prevertebral soft tissues are normal. Early degenerative spurring and disc space narrowing. Mild degenerative facet disease diffusely. No fracture. No epidural or paraspinal hematoma. No acute spreads that IMPRESSION: 2.5 cm hemorrhage within the right frontal lobe with extensive white matter hypodensity throughout the right cerebral hemisphere. While this could be posttraumatic, given the degree of hypodensity throughout the right cerebral hemisphere, I cannot exclude underlying brain lesions. Recommend further evaluation with MRI with and without contrast. No acute bony abnormality in the cervical spine. Attempted to call the Dr. Lillette Boxer care of this patient. She instructed me that she would have to call me back shortly. Electronically Signed: By: Rolm Baptise M.D. On: 09/10/2015 20:28    Review of Systems  Constitutional: Negative.   Eyes: Negative.   Respiratory: Negative.   Cardiovascular: Negative.    Musculoskeletal: Positive for falls.  Skin: Negative.   Neurological: Positive for speech change and headaches.   Blood pressure 111/59, pulse 69, temperature 98.8 F (37.1 C), temperature source Oral, resp. rate 11, height _0  (1.6 m), weight 55.792 kg (123 lb), SpO2 100 %. Physical Exam  Constitutional: She is oriented to person, place, and time. She appears well-developed and well-nourished.  HENT:  Head: Normocephalic.  Eyes: Pupils are equal, round, and reactive to light.  Neck: Normal range of motion.  GI: Soft.  Neurological: She is alert and oriented to person, place, and time. She has normal strength. GCS eye subscore is 4. GCS verbal subscore is 5. GCS motor subscore is 6.  Examination patient and the waiting area of the MRI scanner so somewhat limited but patient is awake and alert oriented 3 pupils are equal cranial nerves are intact strength is 5 out of 5 with no pronator drift lower extremity strength is also 5 out of 5    Assessment/Plan: 70 year old female with syncopal episode right frontal hemorrhagic mass possible static disease also I think this is possibly could be a stroke but there does appear to be also some hypodensity in the posterior right frontal lobe separate and away from this lesion. We will evaluate the MRI scan recommend patient probable need to be admitted to medicine if it's a metastatic workup or neurology for stroke workup. Recommend IV Decadron at 10 mg IV every 6 I will continue to follow.  Andrea Keller 09/10/2015, 9:38 PM

## 2015-09-10 NOTE — ED Notes (Signed)
Pt reports that she was standing up and may have dozed off causing her to fall yesterday. Pt reports that she hit her head on the dresser. Family reports that pt has had weakness for some time that is getting progressively worse.

## 2015-09-10 NOTE — H&P (Signed)
[facility] History and Physical  Andrea Keller [id number] DOB: [birth year] DOA: 09/10/2015  Referring physician: ED physician PCP: Philis Fendt, MD  Specialists:   Chief Complaint: Headache, fall, head injury  HPI: Andrea Keller is a 70 y.o. female with PMH of hypertension, hyperlipidemia, goblet cell carcinoid (s/p of surgery), diverticulosis, insomnia, PVD, who presents with fall, HA and head injury.  Patient reports that she started having frontal headache since Monday 09/05/15. He also has generalized weakness, difficult concentrating. She had a fall yesterday, hit her head on dresser. Patient does not have unilateral weakness, numbness or tingling sensations, no bleeding and no hearing loss. She denies chest pain, shortness breath, abdominal pain, diarrhea, symptoms of UTI. No fever or chills.   In ED, patient was found to have WBC 6.0, temperature normal, no tachycardia, electrolytes okay, creatinine 1.03. CT-C-spines negative for bony fracture. CT head showed large ICH. MRI of brain showed subacute RIGHT frontal lobe 5.3 x 3.3 cm hemorrhage, with disproportionate marginal enhancement and mass effect, concerning for underlying mass though not distinctly demonstrated; RIGHT frontoparietal subarachnoid hemorrhage; multiple subcentimeter foci of acute ischemia (less likely nonenhancing metastasis) within the supra and infratentorial brain, predominately in posterior circulation favoring embolic phenomena. 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 Patient is admitted to inpatient for further evaluation and treatment. Neurology and neurosurgery were consulted by EDP.  EKG: Independently reviewed. QTC 476, deep T-wave inversion in inferior leads and V3-V6.  Where does patient live?   At home  Can patient participate in ADLs?  Some   Review of Systems:   General: no fevers, chills, no changes in body weight, has fatigue.  Has HA. HEENT: no blurry vision, hearing changes or sore throat Pulm: no dyspnea, coughing, wheezing CV: no chest pain, no palpitations Abd: no nausea, vomiting, abdominal pain, diarrhea, constipation GU: no dysuria, burning on urination, increased urinary frequency, hematuria  Ext: no leg edema Neuro: no unilateral weakness, numbness, or tingling, no vision change or hearing loss Skin: no rash MSK: No muscle spasm, no deformity, no limitation of range of movement in spin Heme: No easy bruising.  Travel history: No recent long distant travel.  Allergy:  Allergies  Allergen Reactions  . Latex Rash    Past Medical History  Diagnosis Date  . Diverticulosis   . Hyperplastic colon polyp   . Insomnia   . Microcytosis   . Osteoarthritis   . PVD (peripheral vascular disease) ([place])   . Multiple thyroid nodules   . Hypertension   . Goblet cell carcinoid ([place]) 11/08/2014    Past Surgical History  Procedure Laterality Date  . Cyst removal neck    . Abdominal hysterectomy  2009  . Laparoscopic appendectomy N/A 10/28/2014    Procedure: APPENDECTOMY LAPAROSCOPIC;  Surgeon: Erroll Luna, MD;  Location: [facility];  Service: General;  Laterality: N/A;  . Appendectomy  10/28/2014    Social History:  reports that she has quit smoking. She has never used smokeless tobacco. She reports that she does not drink alcohol or use illicit drugs.  Family History:  Family History  Problem Relation Age of Onset  . Colon cancer Brother   . Diabetes Mother   . Lung cancer Father   . Alcohol abuse       Prior to Admission medications   Medication Sig Start Date End Date Taking? Authorizing Provider  acetaminophen (TYLENOL) 325 MG tablet Take 650 mg by mouth daily  as needed for moderate pain.    Yes Historical Provider, MD  amLODipine (NORVASC) 10 MG tablet Take 10 mg by mouth daily.   Yes Historical Provider, MD  ferrous sulfate 325 (65 FE) MG tablet Take 325 mg by mouth daily with breakfast.   Yes  Historical Provider, MD  rosuvastatin (CRESTOR) 10 MG tablet Take 10 mg by mouth daily.   Yes Historical Provider, MD  metroNIDAZOLE (FLAGYL) 500 MG tablet Take 1 tablet (500 mg total) by mouth 3 (three) times daily. 11/08/14   Lisette Abu, PA-C  oxyCODONE-acetaminophen (PERCOCET/ROXICET) 5-325 MG per tablet Take 1-2 tablets by mouth every 4 (four) hours as needed for moderate pain. 11/08/14   Lisette Abu, PA-C    Physical Exam: Filed Vitals:   09/10/15 1945 09/10/15 1951 09/10/15 2030 09/10/15 2115  BP: 123/68  122/50 111/59  Pulse:   69   Temp:  98.8 F (37.1 C)    TempSrc:  Oral    Resp:   14 11  Height:      Weight:      SpO2:   100%    General: Not in acute distress HEENT:       Eyes: PERRL, EOMI, no scleral icterus.       ENT: No discharge from the ears and nose, no pharynx injection, no tonsillar enlargement.        Neck: No JVD, no bruit, no mass felt. Heme: No neck lymph node enlargement. Cardiac: S1/S2, RRR, No murmurs, No gallops or rubs. Pulm:  No rales, wheezing, rhonchi or rubs. Abd: Soft, nondistended, nontender, no rebound pain, no organomegaly, BS present. Ext: No pitting leg edema bilaterally. 2+DP/PT pulse bilaterally. Musculoskeletal: No joint deformities, No joint redness or warmth, no limitation of ROM in spin. Skin: No rashes.  Neuro: Alert, oriented X3, cranial nerves II-XII grossly intact, moves all extremities normally. Muscle strength 5/5 in all extremities, sensation to light touch intact. BKnee reflex 1+ bilaterally. Negative Babinski's sign. Normal finger to nose test. Psych: Patient is not psychotic, no suicidal or hemocidal ideation.  Labs on Admission:  Basic Metabolic Panel:  Recent Labs Lab 09/10/15 2022  NA 140  K 4.8  CL 103  CO2 26  GLUCOSE 113*  BUN 15  CREATININE 1.03*  CALCIUM 9.4   Liver Function Tests:  Recent Labs Lab 09/10/15 2022  AST 21  ALT 15  ALKPHOS 75  BILITOT 0.3  PROT 7.0  ALBUMIN 3.5   No  results for input(s): LIPASE, AMYLASE in the last 168 hours. No results for input(s): AMMONIA in the last 168 hours. CBC:  Recent Labs Lab 09/10/15 2022  WBC 6.0  NEUTROABS 2.7  HGB 10.7*  HCT 33.1*  MCV 74.5*  PLT 154   Cardiac Enzymes: No results for input(s): CKTOTAL, CKMB, CKMBINDEX, TROPONINI in the last 168 hours.  BNP (last 3 results) No results for input(s): BNP in the last 8760 hours.  ProBNP (last 3 results) No results for input(s): PROBNP in the last 8760 hours.  CBG: No results for input(s): GLUCAP in the last 168 hours.  Radiological Exams on Admission: Ct Head Wo Contrast  09/10/2015  ADDENDUM REPORT: 09/10/2015 20:50 ADDENDUM: The PA taking care the patient returned my call. Results were given verbally on 09/10/2015 at 8:49 pm to [facility] , who verbally acknowledged these results. Electronically Signed   By: Rolm Baptise M.D.   On: 09/10/2015 20:50  09/10/2015  CLINICAL DATA:  Fall last night, head injury. EXAM:  CT HEAD WITHOUT CONTRAST CT CERVICAL SPINE WITHOUT CONTRAST TECHNIQUE: Multidetector CT imaging of the head and cervical spine was performed following the standard protocol without intravenous contrast. Multiplanar CT image reconstructions of the cervical spine were also generated. COMPARISON:  None. FINDINGS: CT HEAD FINDINGS There is an area of hemorrhage within the right frontal lobe measuring up to 2.5 cm. There is extensive subcortical and deep white matter hypodensity throughout the right cerebral hemisphere, which appears to be out of proportion to the bleed. Also, some areas of hypodensity are remote to the bleed. I cannot exclude underlying mass lesions within the right cerebral hemisphere. No significant midline shift. No hydrocephalus. No acute calvarial abnormality or scalp swelling. Visualized paranasal sinuses and mastoids clear. Orbital soft tissues unremarkable. CT CERVICAL SPINE FINDINGS Alignment is normal. Prevertebral soft tissues are  normal. Early degenerative spurring and disc space narrowing. Mild degenerative facet disease diffusely. No fracture. No epidural or paraspinal hematoma. No acute spreads that IMPRESSION: 2.5 cm hemorrhage within the right frontal lobe with extensive white matter hypodensity throughout the right cerebral hemisphere. While this could be posttraumatic, given the degree of hypodensity throughout the right cerebral hemisphere, I cannot exclude underlying brain lesions. Recommend further evaluation with MRI with and without contrast. No acute bony abnormality in the cervical spine. Attempted to call the Dr. Lillette Boxer care of this patient. She instructed me that she would have to call me back shortly. Electronically Signed: By: Rolm Baptise M.D. On: 09/10/2015 20:28   Ct Cervical Spine Wo Contrast  09/10/2015  ADDENDUM REPORT: 09/10/2015 20:50 ADDENDUM: The PA taking care the patient returned my call. Results were given verbally on 09/10/2015 at 8:49 pm to [facility] , who verbally acknowledged these results. Electronically Signed   By: Rolm Baptise M.D.   On: 09/10/2015 20:50  09/10/2015  CLINICAL DATA:  Fall last night, head injury. EXAM: CT HEAD WITHOUT CONTRAST CT CERVICAL SPINE WITHOUT CONTRAST TECHNIQUE: Multidetector CT imaging of the head and cervical spine was performed following the standard protocol without intravenous contrast. Multiplanar CT image reconstructions of the cervical spine were also generated. COMPARISON:  None. FINDINGS: CT HEAD FINDINGS There is an area of hemorrhage within the right frontal lobe measuring up to 2.5 cm. There is extensive subcortical and deep white matter hypodensity throughout the right cerebral hemisphere, which appears to be out of proportion to the bleed. Also, some areas of hypodensity are remote to the bleed. I cannot exclude underlying mass lesions within the right cerebral hemisphere. No significant midline shift. No hydrocephalus. No acute calvarial abnormality or  scalp swelling. Visualized paranasal sinuses and mastoids clear. Orbital soft tissues unremarkable. CT CERVICAL SPINE FINDINGS Alignment is normal. Prevertebral soft tissues are normal. Early degenerative spurring and disc space narrowing. Mild degenerative facet disease diffusely. No fracture. No epidural or paraspinal hematoma. No acute spreads that IMPRESSION: 2.5 cm hemorrhage within the right frontal lobe with extensive white matter hypodensity throughout the right cerebral hemisphere. While this could be posttraumatic, given the degree of hypodensity throughout the right cerebral hemisphere, I cannot exclude underlying brain lesions. Recommend further evaluation with MRI with and without contrast. No acute bony abnormality in the cervical spine. Attempted to call the Dr. Lillette Boxer care of this patient. She instructed me that she would have to call me back shortly. Electronically Signed: By: Rolm Baptise M.D. On: 09/10/2015 20:28   Mr Jeri Cos [id number] Contrast  09/10/2015  CLINICAL DATA:  [place] yesterday, striking head on dresser. Progressive weakness. Follow-up  intracranial hemorrhage. History of carcinoid tumor. EXAM: MRI HEAD WITHOUT AND WITH CONTRAST TECHNIQUE: Multiplanar, multiecho pulse sequences of the brain and surrounding structures were obtained without and with intravenous contrast. CONTRAST:  50mL MULTIHANCE GADOBENATE DIMEGLUMINE 529 MG/ML IV SOLN COMPARISON:  CT head September 10, 2015 FINDINGS: RIGHT frontal intraparenchymal hemorrhage with peripheral T1 shortening and extensive surrounding T2 bright edema. Superimposed smooth rind of peripheral enhancement, in total measuring 5.3 x 3.3 cm. RIGHT frontal local mass effect, resulting in 2 mm RIGHT to LEFT midline shift. Mass effect on RIGHT frontal horn of lateral ventricle without hydrocephalus or entrapment. Susceptibility artifact within the RIGHT frontoparietal subarachnoid spaces. At least 2 subcentimeter foci of reduced diffusion LEFT cerebellum, in  addition to subcentimeter focus of reduced diffusion bilateral occipital lobes, bilateral parietal lobes and, LEFT frontal lobe. 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 RIGHT parietal occipital lobe encephalomalacia with intrinsic T1 shortening consistent with laminar necrosis. Patchy to confluent additional supratentorial white matter FLAIR T2 hyperintensities compatible with subarachnoid hemorrhage. 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 Major intracranial vascular flow voids present at skull base. Ocular globes and orbital contents are unremarkable. Fluid-filled expanded empty sella. No cerebellar tonsillar ectopia. No suspicious calvarial bone marrow signal. IMPRESSION: Subacute RIGHT frontal lobe 5.3 x 3.3 cm hemorrhage, with disproportionate marginal enhancement and mass effect, concerning for underlying mass though not distinctly demonstrated. Recommend 10-14 day follow-up MRI of the brain with contrast. RIGHT frontoparietal subarachnoid hemorrhage. Multiple subcentimeter foci of acute ischemia (less likely nonenhancing metastasis) within the supra and infratentorial brain, predominately in posterior circulation favoring embolic phenomena. 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 Electronically Signed   By: Elon Alas M.D.   On: 09/10/2015 22:53    Assessment/Plan Principal Problem:   Intraparenchymal hemorrhage of brain (HCC) Active Problems:   Goblet cell carcinoid (HCC)   Microcytosis   Hypertension   Stroke (cerebrum) (HCC)   Intraparenchymal hemorrhage of brain ([place]): As evidenced with CT head. No C-spine bony abnormalities on CT C-spine. Neurosurgeon was consulted, Dr. Saintclair Halsted saw pt, recommended IV Decadron at 10 mg IV every 6h and MRI-brain. The MRI of brain showed subacute RIGHT hemorrhage, RIGHT frontoparietal subarachnoid hemorrhage and multiple  subcentimeter foci of acute ischemia, also concerning for underlying mass. 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  -will admit to SDU -Frequent neuro checks -IV Decadron 10 mg every 6 hours per neurosurgeon -Follow-up neurosurgeons further recommendations  Stroke: MRI of brain also showed ischemic stroke, and possible underlying mass. Neurology was consulted, Dr. Leonel Ramsay saw patient, recommended stroke workup.   -Appreciate Dr. Cecil Cobbs consultation, with follow-up recommendations follows:  1. HgbA1c, fasting lipid panel  2. MRA of the brain without contrast, MRA of the neck with contrast  3. Frequent neuro checks  4. Echocardiogram  5. Metastatic workup, if nothing is found, repeat imaging in a few weeks would be indicated  6. Prophylactic none given ICH  7. Risk factor modification  8. Telemetry monitoring  9. P-CT chest looking for metastasesT consult, OT consult, Speech consult -CT chest looking for metastases  Goblet cell carcinoid [facility]): s/p of surgery. -follow CT-chest to r/o metastases.  Microcytosis: -On iron supplement  Hypertension: Blood pressure soft -Hold amlodipine  HLD: Last LDL was not on record -Continue home medications: Crestor -Check FLP  DVT ppx: SCD  Code Status: Full code Family Communication: Yes, patient's husband at bed side Disposition Plan: Admit to inpatient   Date of Service 09/11/2015    Ivor Costa [facility] Pager [phone]  If 7PM-7AM, please contact night-coverage [email or web address] Password [email or web address] 09/11/2015, 12:46 AM

## 2015-09-10 NOTE — ED Provider Notes (Signed)
CSN: [id number]     Arrival date & time 09/10/15  1500 History   First MD Initiated Contact with Patient 09/10/15 1917     Chief Complaint  Patient presents with  . Fall  . Head Injury     (Consider location/radiation/quality/duration/timing/severity/associated sxs/prior Treatment) HPI  Andrea Keller is a 70 y.o F with A past medical history of HTN, Appendiceal cancer s/p removal and chemotherapy who presents to the emergency room in today to be evaluated after a fall. Patient states that over the last week she has been feeling very fatigued and weak. Yesterday she was sitting on the foot of her bed when she slid off the average and struck the front of her head on the dresser. No loss of consciousness. Patient's husband states that the patient has also been getting more confused than normal, forgetting where she is going often. Husband states this has been an ongoing issue but I has acutely worsened in the last week. Patient is not on blood thinners. Patient denies recent illnesses, chest pain, dizziness, blurry vision, neck pain, numbness. Patient denies history of heart disease or stroke. Past Medical History  Diagnosis Date  . Diverticulosis   . Hyperplastic colon polyp   . Insomnia   . Microcytosis   . Osteoarthritis   . PVD (peripheral vascular disease) ([place])   . Multiple thyroid nodules   . Hypertension   . 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 Cyst removal neck    . Abdominal hysterectomy  2009  . Laparoscopic appendectomy N/A 10/28/2014    Procedure: APPENDECTOMY LAPAROSCOPIC;  Surgeon: Erroll Luna, MD;  Location: [facility];  Service: General;  Laterality: N/A;  . Appendectomy  10/28/2014   Family History  Problem Relation Age of Onset  . Colon cancer Brother   . Diabetes Mother   . Lung cancer Father   . Alcohol abuse     Social History  Substance Use Topics  . Smoking status: Former Research scientist (life sciences)  . Smokeless tobacco:  Never Used     Comment: quit smoking years ago   . Alcohol Use: No   OB History    No data available     Review of Systems  All other systems reviewed and are negative.     Allergies  Latex  Home Medications   Prior to Admission medications   Medication Sig Start Date End Date Taking? Authorizing Provider  amLODipine (NORVASC) 10 MG tablet Take 10 mg by mouth daily.    Historical Provider, MD  estradiol (ESTRACE) 0.1 MG/GM vaginal cream Place 1 Applicatorful vaginally 3 (three) times a week.    Historical Provider, MD  ferrous sulfate 325 (65 FE) MG tablet Take 325 mg by mouth daily with breakfast.    Historical Provider, MD  metroNIDAZOLE (FLAGYL) 500 MG tablet Take 1 tablet (500 mg total) by mouth 3 (three) times daily. 11/08/14   Lisette Abu, PA-C  oxyCODONE-acetaminophen (PERCOCET/ROXICET) 5-325 MG per tablet Take 1-2 tablets by mouth every 4 (four) hours as needed for moderate pain. 11/08/14   Lisette Abu, PA-C  rosuvastatin (CRESTOR) 10 MG tablet Take 10 mg by mouth daily.    Historical Provider, MD   BP 123/68 mmHg  Pulse 70  Temp(Src) 98.8 F (37.1 C) (Oral)  Resp 18  Ht 5\' 3"  (1.6 m)  Wt 55.792 kg  BMI 21.79 kg/m2  SpO2 100% Physical Exam  Constitutional: She is oriented to person, place, and  time. She appears well-developed and well-nourished. No distress.  HENT:  Head: Normocephalic and atraumatic.  Mouth/Throat: No oropharyngeal exudate.  Eyes: Conjunctivae and EOM are normal. Pupils are equal, round, and reactive to light. Right eye exhibits no discharge. Left eye exhibits no discharge. No scleral icterus.  Neck: Neck supple. No JVD present.  Cardiovascular: Normal rate, regular rhythm, normal heart sounds and intact distal pulses.  Exam reveals no gallop and no friction rub.   No murmur heard. Pulmonary/Chest: Effort normal and breath sounds normal. No respiratory distress. She has no wheezes. She has no rales. She exhibits no tenderness.   Abdominal: Soft. She exhibits no distension. There is no tenderness. There is no guarding.  Musculoskeletal: Normal range of motion. She exhibits no edema.  Lymphadenopathy:    She has no cervical adenopathy.  Neurological: She is alert and oriented to person, place, and time. No cranial nerve deficit. Coordination normal.  Strength 5/5 throughout. No sensory deficits.  No ataxia.  Skin: Skin is warm and dry. No rash noted. She is not diaphoretic. No erythema. No pallor.  Psychiatric: She has a normal mood and affect. Her behavior is normal.  Nursing note and vitals reviewed.   ED Course  Procedures (including critical care time) Labs Review Labs Reviewed  COMPREHENSIVE METABOLIC PANEL - Abnormal; Notable for the following:    Glucose, Bld 113 (*)    Creatinine, Ser 1.03 (*)    GFR calc non Af Amer 54 (*)    All other components within normal limits  CBC WITH DIFFERENTIAL/PLATELET - Abnormal; Notable for the following:    Hemoglobin 10.7 (*)    HCT 33.1 (*)    MCV 74.5 (*)    MCH 24.1 (*)    RDW 15.7 (*)    All other components within normal limits  URINALYSIS, ROUTINE W REFLEX MICROSCOPIC (NOT AT [facility])  PROTIME-INR  APTT  I-STAT TROPOININ, ED    Imaging Review Ct Head Wo Contrast  09/10/2015  ADDENDUM REPORT: 09/10/2015 20:50 ADDENDUM: The PA taking care the patient returned my call. Results were given verbally on 09/10/2015 at 8:49 pm to [facility] , who verbally acknowledged these results. Electronically Signed   By: Rolm Baptise M.D.   On: 09/10/2015 20:50  09/10/2015  CLINICAL DATA:  Fall last night, head injury. EXAM: CT HEAD WITHOUT CONTRAST CT CERVICAL SPINE WITHOUT CONTRAST TECHNIQUE: Multidetector CT imaging of the head and cervical spine was performed following the standard protocol without intravenous contrast. Multiplanar CT image reconstructions of the cervical spine were also generated. COMPARISON:  None. FINDINGS: CT HEAD FINDINGS There is an area of  hemorrhage within the right frontal lobe measuring up to 2.5 cm. There is extensive subcortical and deep white matter hypodensity throughout the right cerebral hemisphere, which appears to be out of proportion to the bleed. Also, some areas of hypodensity are remote to the bleed. I cannot exclude underlying mass lesions within the right cerebral hemisphere. No significant midline shift. No hydrocephalus. No acute calvarial abnormality or scalp swelling. Visualized paranasal sinuses and mastoids clear. Orbital soft tissues unremarkable. CT CERVICAL SPINE FINDINGS Alignment is normal. Prevertebral soft tissues are normal. Early degenerative spurring and disc space narrowing. Mild degenerative facet disease diffusely. No fracture. No epidural or paraspinal hematoma. No acute spreads that IMPRESSION: 2.5 cm hemorrhage within the right frontal lobe with extensive white matter hypodensity throughout the right cerebral hemisphere. While this could be posttraumatic, given the degree of hypodensity throughout the right cerebral hemisphere, I cannot  exclude underlying brain lesions. Recommend further evaluation with MRI with and without contrast. No acute bony abnormality in the cervical spine. Attempted to call the Dr. Lillette Boxer care of this patient. She instructed me that she would have to call me back shortly. Electronically Signed: By: Rolm Baptise M.D. On: 09/10/2015 20:28   Ct Cervical Spine Wo Contrast  09/10/2015  ADDENDUM REPORT: 09/10/2015 20:50 ADDENDUM: The PA taking care the patient returned my call. Results were given verbally on 09/10/2015 at 8:49 pm to [facility] , who verbally acknowledged these results. Electronically Signed   By: Rolm Baptise M.D.   On: 09/10/2015 20:50  09/10/2015  CLINICAL DATA:  Fall last night, head injury. EXAM: CT HEAD WITHOUT CONTRAST CT CERVICAL SPINE WITHOUT CONTRAST TECHNIQUE: Multidetector CT imaging of the head and cervical spine was performed following the standard  protocol without intravenous contrast. Multiplanar CT image reconstructions of the cervical spine were also generated. COMPARISON:  None. FINDINGS: CT HEAD FINDINGS There is an area of hemorrhage within the right frontal lobe measuring up to 2.5 cm. There is extensive subcortical and deep white matter hypodensity throughout the right cerebral hemisphere, which appears to be out of proportion to the bleed. Also, some areas of hypodensity are remote to the bleed. I cannot exclude underlying mass lesions within the right cerebral hemisphere. No significant midline shift. No hydrocephalus. No acute calvarial abnormality or scalp swelling. Visualized paranasal sinuses and mastoids clear. Orbital soft tissues unremarkable. CT CERVICAL SPINE FINDINGS Alignment is normal. Prevertebral soft tissues are normal. Early degenerative spurring and disc space narrowing. Mild degenerative facet disease diffusely. No fracture. No epidural or paraspinal hematoma. No acute spreads that IMPRESSION: 2.5 cm hemorrhage within the right frontal lobe with extensive white matter hypodensity throughout the right cerebral hemisphere. While this could be posttraumatic, given the degree of hypodensity throughout the right cerebral hemisphere, I cannot exclude underlying brain lesions. Recommend further evaluation with MRI with and without contrast. No acute bony abnormality in the cervical spine. Attempted to call the Dr. Lillette Boxer care of this patient. She instructed me that she would have to call me back shortly. Electronically Signed: By: Rolm Baptise M.D. On: 09/10/2015 20:28   Mr Jeri Cos [id number] Contrast  09/10/2015  CLINICAL DATA:  [place] yesterday, striking head on dresser. Progressive weakness. Follow-up intracranial hemorrhage. History of carcinoid tumor. EXAM: MRI HEAD WITHOUT AND WITH CONTRAST TECHNIQUE: Multiplanar, multiecho pulse sequences of the brain and surrounding structures were obtained without and with intravenous contrast.  CONTRAST:  23mL MULTIHANCE GADOBENATE DIMEGLUMINE 529 MG/ML IV SOLN COMPARISON:  CT head September 10, 2015 FINDINGS: RIGHT frontal intraparenchymal hemorrhage with peripheral T1 shortening and extensive surrounding T2 bright edema. Superimposed smooth rind of peripheral enhancement, in total measuring 5.3 x 3.3 cm. RIGHT frontal local mass effect, resulting in 2 mm RIGHT to LEFT midline shift. Mass effect on RIGHT frontal horn of lateral ventricle without hydrocephalus or entrapment. Susceptibility artifact within the RIGHT frontoparietal subarachnoid spaces. At least 2 subcentimeter foci of reduced diffusion LEFT cerebellum, in addition to subcentimeter focus of reduced diffusion bilateral occipital lobes, bilateral parietal lobes and, LEFT frontal lobe. 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 RIGHT parietal occipital lobe encephalomalacia with intrinsic T1 shortening consistent with laminar necrosis. Patchy to confluent additional supratentorial white matter FLAIR T2 hyperintensities compatible with subarachnoid hemorrhage. 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 Major intracranial vascular flow voids present at skull base. Ocular globes and orbital contents are unremarkable. Fluid-filled expanded empty sella. No cerebellar  tonsillar ectopia. No suspicious calvarial bone marrow signal. IMPRESSION: Subacute RIGHT frontal lobe 5.3 x 3.3 cm hemorrhage, with disproportionate marginal enhancement and mass effect, concerning for underlying mass though not distinctly demonstrated. Recommend 10-14 day follow-up MRI of the brain with contrast. RIGHT frontoparietal subarachnoid hemorrhage. Multiple subcentimeter foci of acute ischemia (less likely nonenhancing metastasis) within the supra and infratentorial brain, predominately in posterior circulation favoring embolic phenomena. 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 7 x 9 mm LEFT paraclinoid aneurysm would be better characterized on CTA head on a  nonemergent basis. Electronically Signed   By: Elon Alas M.D.   On: 09/10/2015 22:53   I have personally reviewed and evaluated these images and lab results as part of my medical decision-making.   EKG Interpretation None      MDM   Final diagnoses:  Intraparenchymal hemorrhage of brain (HCC)  Microcytosis  PVD (peripheral vascular disease) ([place])  Essential hypertension    70 year old female presents the ED with worsening weakness over 1 week and a fall with a head injury that occurred yesterday. Patient appears well in ED, alert and oriented 3. No neurological deficits seen on exam. CT head obtained which reveals 2.5 cm hemorrhage within the right frontal lobe with extensive white matter hypodensity throughout the right cerebral hemisphere concerning for underlying brain lesions. I spoke with radiologist who states that this is most likely posttraumatic given nature of the bleed and surrounding hypodensities. Will order MRI brain to further evaluate. Spoke with neurosurgery Dr. Saintclair Halsted who will consult patient in ED. Pending results of MRI patient will probably need medicine admission for metastatic workup versus stroke workup.  MRI reveals subacute right frontal lobe 5.3 x 3.37 m hemorrhage with distal portion at marginal enhancement and mass effect concerning for underlying mass. Right frontoparietal subarachnoid hemorrhage as well as multiple subcentimeter foci of acute ischemia favoring embolic phenomena. It appears that patient is having strokelike changes on MRI in addition to her hemorrhage. [facility] consult neurology as well as hospitalist.  11:35 PM] Spoke with Dr. Leonel Ramsay with neurology who reviewed the images and states that he does not feel this is an acute stroke and that the hemorrhage as well as the ischemia is due to a mass. He recommends admission to hospitalist for metastatic workup as well as stroke workup. We'll consult hospitalist.  Spoke with Dr.Niu with  hospitalist who will admit patient to stepdown. Pt remains alert and oriented. No neuro deficits.     [place], PA-C 09/10/15 2349  Forde Dandy, MD 09/11/15 [phone]

## 2015-09-11 ENCOUNTER — Inpatient Hospital Stay (HOSPITAL_COMMUNITY): Payer: Medicare Other

## 2015-09-11 DIAGNOSIS — I6340 Cerebral infarction due to embolism of unspecified cerebral artery: Secondary | ICD-10-CM

## 2015-09-11 DIAGNOSIS — I639 Cerebral infarction, unspecified: Secondary | ICD-10-CM | POA: Diagnosis present

## 2015-09-11 LAB — LIPID PANEL
CHOL/HDL RATIO: 2.7 ratio
Cholesterol: 148 mg/dL (ref 0–200)
HDL: 55 mg/dL (ref >40)
LDL CALC: 84 mg/dL (ref 0–99)
Triglycerides: 44 mg/dL (ref <150)
VLDL: 9 mg/dL (ref 0–40)

## 2015-09-11 LAB — IRON AND TIBC
IRON: 135 ug/dL (ref 28–170)
SATURATION RATIOS: 57 % — AB (ref 10.4–31.8)
TIBC: 235 ug/dL — AB (ref 250–450)
UIBC: 100 ug/dL

## 2015-09-11 LAB — TROPONIN I
TROPONIN I: 0.14 ng/mL — AB (ref <0.031)
Troponin I: 0.14 ng/mL — ABNORMAL HIGH (ref <0.031)
Troponin I: 0.12 ng/mL — ABNORMAL HIGH (ref <0.031)
Troponin I: 0.11 ng/mL — ABNORMAL HIGH (ref <0.031)

## 2015-09-11 LAB — URINALYSIS, ROUTINE W REFLEX MICROSCOPIC
BILIRUBIN URINE: NEGATIVE
Glucose, UA: NEGATIVE mg/dL
Hgb urine dipstick: NEGATIVE
KETONES UR: NEGATIVE mg/dL
Leukocytes, UA: NEGATIVE
Nitrite: NEGATIVE
PH: 7 (ref 5.0–8.0)
Protein, ur: NEGATIVE mg/dL
SPECIFIC GRAVITY, URINE: 1.017 (ref 1.005–1.030)

## 2015-09-11 LAB — RETICULOCYTES
RBC.: 4.45 MIL/uL (ref 3.87–5.11)
Retic Count, Absolute: 57.9 10*3/uL (ref 19.0–186.0)
Retic Ct Pct: 1.3 % (ref 0.4–3.1)

## 2015-09-11 LAB — BASIC METABOLIC PANEL
Anion gap: 10 (ref 5–15)
BUN: 11 mg/dL (ref 6–20)
CHLORIDE: 106 mmol/L (ref 101–111)
CO2: 24 mmol/L (ref 22–32)
Calcium: 9.3 mg/dL (ref 8.9–10.3)
Creatinine, Ser: 0.93 mg/dL (ref 0.44–1.00)
GFR calc Af Amer: >60 mL/min (ref >60)
GFR calc non Af Amer: >60 mL/min (ref >60)
GLUCOSE: 127 mg/dL — AB (ref 65–99)
POTASSIUM: 4.5 mmol/L (ref 3.5–5.1)
Sodium: 140 mmol/L (ref 135–145)

## 2015-09-11 LAB — APTT: APTT: 30 s (ref 24–37)

## 2015-09-11 LAB — FERRITIN: FERRITIN: 865 ng/mL — AB (ref 11–307)

## 2015-09-11 LAB — FOLATE: Folate: 16.2 ng/mL (ref >5.9)

## 2015-09-11 LAB — PROTIME-INR
INR: 1.06 (ref 0.00–1.49)
PROTHROMBIN TIME: 14 s (ref 11.6–15.2)

## 2015-09-11 LAB — VITAMIN B12: Vitamin B-12: 409 pg/mL (ref 180–914)

## 2015-09-11 LAB — MRSA PCR SCREENING: MRSA BY PCR: NEGATIVE

## 2015-09-11 MED ORDER — ONDANSETRON HCL 4 MG/2ML IJ SOLN: 4.0000 mg | Freq: Four times a day (QID) | INTRAMUSCULAR | Status: DC | PRN

## 2015-09-11 MED ORDER — OXYCODONE-ACETAMINOPHEN 5-325 MG PO TABS: 1.0000 | ORAL_TABLET | ORAL | Status: DC | PRN

## 2015-09-11 MED ORDER — ROSUVASTATIN CALCIUM 10 MG PO TABS
10.0000 mg | ORAL_TABLET | Freq: Every day | ORAL | Status: DC
  Administered 2015-09-11: 10 mg via ORAL
  Filled 2015-09-11 (×2): qty 1

## 2015-09-11 MED ORDER — SODIUM CHLORIDE 0.9 % IV SOLN
INTRAVENOUS | Status: DC
  Administered 2015-09-11 (×2): via INTRAVENOUS

## 2015-09-11 MED ORDER — FERROUS SULFATE 325 (65 FE) MG PO TABS
325.0000 mg | ORAL_TABLET | Freq: Every day | ORAL | Status: DC
  Administered 2015-09-11 – 2015-09-14 (×4): 325 mg via ORAL
  Filled 2015-09-11 (×4): qty 1

## 2015-09-11 MED ORDER — PANTOPRAZOLE SODIUM 40 MG PO TBEC
40.0000 mg | DELAYED_RELEASE_TABLET | Freq: Every day | ORAL | Status: DC
  Administered 2015-09-12 – 2015-09-14 (×3): 40 mg via ORAL
  Filled 2015-09-11 (×3): qty 1

## 2015-09-11 MED ORDER — DEXAMETHASONE SODIUM PHOSPHATE 4 MG/ML IJ SOLN
10.0000 mg | Freq: Four times a day (QID) | INTRAMUSCULAR | Status: DC
  Administered 2015-09-11 – 2015-09-13 (×11): 10 mg via INTRAVENOUS
  Filled 2015-09-11 (×12): qty 3

## 2015-09-11 MED ORDER — SENNOSIDES-DOCUSATE SODIUM 8.6-50 MG PO TABS
1.0000 | ORAL_TABLET | Freq: Every evening | ORAL | Status: DC | PRN
  Administered 2015-09-13: 1 via ORAL
  Filled 2015-09-11 (×2): qty 1

## 2015-09-11 MED ORDER — ZOLPIDEM TARTRATE 5 MG PO TABS
5.0000 mg | ORAL_TABLET | Freq: Every evening | ORAL | Status: DC | PRN
Start: 2015-09-11 — End: 2015-09-14

## 2015-09-11 MED ORDER — IOHEXOL 300 MG/ML  SOLN
75.0000 mL | Freq: Once | INTRAMUSCULAR | Status: AC | PRN
  Administered 2015-09-11: 75 mL via INTRAVENOUS

## 2015-09-11 MED ORDER — STROKE: EARLY STAGES OF RECOVERY BOOK
Freq: Once | Status: AC
  Administered 2015-09-11: 16:00:00
  Filled 2015-09-11: qty 1

## 2015-09-11 MED ORDER — ACETAMINOPHEN 325 MG PO TABS
650.0000 mg | ORAL_TABLET | Freq: Every day | ORAL | Status: DC | PRN
  Administered 2015-09-12 – 2015-09-13 (×2): 650 mg via ORAL
  Filled 2015-09-11 (×2): qty 2

## 2015-09-11 NOTE — ED Notes (Signed)
Meal tray ordered for pt  

## 2015-09-11 NOTE — Consult Note (Addendum)
Neurology Consultation Reason for Consult: [place] Referring Physician: Mora Bellman  CC: ICH  History is obtained from: Patient, husband  HPI: Andrea Keller is a 70 y.o. 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 She states that this happened rather suddenly last Monday 3/6. It has been persistent since that time. She fell and that is what prompted her coming to the emergency room where CT was performed showing the fairly large ICH. Neurosurgery was consulted who evaluated the patient recommend an MRI. MRI shows edema surrounding ICH as well as rim enhancement of the ICH, which radiology feels is concerning for, but not diagnostic of malignancy. She has been started on IV Decadron.   LKW: 3/6 tpa given?: no, ICH Premorbid modified rankin scale: 0 ICH Score: 0  ROS: A 14 point ROS was performed and is negative except as noted in the HPI.  Past Medical History  Diagnosis Date  . Diverticulosis   . Hyperplastic colon polyp   . Insomnia   . Microcytosis   . Osteoarthritis   . PVD (peripheral vascular disease) ([place])   . Multiple thyroid nodules   . Hypertension   . 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 Colon cancer Brother   . Diabetes Mother   . Lung cancer Father   . Alcohol abuse       Social History:  reports that she has quit smoking. She has never used smokeless tobacco. She reports that she does not drink alcohol or use illicit drugs.   Exam: Current vital signs: BP 111/59 mmHg  Pulse 69  Temp(Src) 98.8 F (37.1 C) (Oral)  Resp 11  Ht 5\' 3"  (1.6 m)  Wt 55.792 kg (123 lb)  BMI 21.79 kg/m2  SpO2 100% Vital signs in last 24 hours: Temp:  [98.6 F (37 C)-98.8 F (37.1 C)] 98.8 F (37.1 C) (03/11 1951) Pulse Rate:  [69-70] 69 (03/11 2030) Resp:  [11-18] 11 (03/11 2115) BP: (111-126)/(50-69) 111/59 mmHg (03/11  2115) SpO2:  [100 %] 100 % (03/11 2030) Weight:  [55.792 kg (123 lb)] 55.792 kg (123 lb) (03/11 1617)   Physical Exam  Constitutional: Appears well-developed and well-nourished.  Psych: Affect appropriate to situation Eyes: No scleral injection HENT: No OP obstrucion Head: Normocephalic.  Cardiovascular: Normal rate and regular rhythm.  Respiratory: Effort normal and breath sounds normal to anterior ascultation GI: Soft.  No distension. There is no tenderness.  Skin: WDI  Neuro: Mental Status: Patient is awake, alert, oriented to person, place,  year, and situation. She is unable to get the month Patient is able to give a clear and coherent history. No signs of aphasia or neglect Cranial Nerves: II: Visual Fields are full. Pupils are equal, round, and reactive to light.   III,IV, VI: EOMI without ptosis or diploplia.  V: Facial sensation is symmetric to temperature VII: Facial movement is symmetric.  VIII: hearing is intact to voice X: Uvula elevates symmetrically XI: Shoulder shrug is symmetric. XII: tongue is midline without atrophy or fasciculations.  Motor: Tone is normal. Bulk is normal. 5/5 strength was present in all four extremities.  Sensory: Sensation is symmetric to light touch and temperature in the arms and legs. Cerebellar: FNF  intact bilaterally   I have reviewed labs in epic and the results pertinent to this consultation are: CMP-unremarkable  I have reviewed the  images obtained: MR brain-ICH right frontal region.  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 Possibilities for the ICH include hemorrhagic infarct, though I feel that hemorrhagic metastasis is more likely. She is now almost a week from onset of symptoms, so I think that she is out of the emergent timeframe for her ICH.  I think that working her up as possible metastatic disease would also be prudent.  Recommendations: 1. HgbA1c, fasting lipid  panel 2. MRA  of the brain without contrast, MRA of the neck with contrast 3. Frequent neuro checks 4. Echocardiogram 5. Metastatic workup, if nothing is found, repeat imaging in a few weeks would be indicated 6. Prophylactic none given ICH 7. Risk factor modification 8. Telemetry monitoring 9. PT consult, OT consult, Speech consult 10. please page stroke NP  Or  PA  Or MD  M-F from 8am -4 pm starting 3/12 as this patient will be followed by the stroke team at this point.   You can look them up on [email or web address]  Password [email or web address]    Roland Rack, MD [facility] [phone]  If 7pm- 7am, please page neurology on call as listed in [place].

## 2015-09-11 NOTE — ED Notes (Signed)
Attempted report 

## 2015-09-11 NOTE — Progress Notes (Signed)
PATIENT DETAILS Name: Andrea Keller Age: 70 y.o. Sex: female Date of Birth: 1946-05-21 Admit Date: 09/10/2015 Admitting Physician Ivor Costa, MD [place]:[id number] Bunnie Domino, MD  Subjective: Awake/alert-mild headache. No confusion this am-per daughter-has been confused for a few days prior to this admit  Assessment/Plan: Principal Problem: Right frontal ICH: Non focal exam-except for slight left facial droop. 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 Await MRI Neck, 2 D Echo. Spoke with Dr Erlinda Hong, plans are to repeat MRI brain in 2-4 weeks after ICH resolution to definitely rule out metastasis to the brain. He also recommends a 30 day cardiac monitor. LDL 84, A1C pending. Avoid ASA-given ICH-continue Decadron. Given intermittent confusion-check EEG.  Active Problems: Intermittent Confusion:per daughter-none seen this am-check EEG-low threshold to start anti-epileptics  Hx of Goblet cell carcinoid of the appendix: is s/p Laparoscopic right hemicolectomy, Omentectomy, HIPEC with mitomycin C on 12/22/2014.Followed at [facility] 3/12 neg for mets/malignancy. Most recent CT Abd 2/24 at [facility] negative for malignancy as well (reviwed report in care everywhere). However has microcytic anemia-but colonoscopy on 2015 was negative. Will d/w Oncology tomorrow-to see whether we should send out tumor markers-or pursue endoscopic eval at some point?  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 Troponin trend is flat and not consistent with ACS. Given ICH-unable to use anti-platelets or pursue further work up.  Await Echo.   [id number]  Microcytic Anemia:check anemia panel and FOBT  Disposition: Remain inpatient  Antimicrobial agents  See below  Anti-infectives    None      DVT Prophylaxis:  SCD's  Code Status: Full code   Family Communication Daughter at bedside  Procedures: None  CONSULTS:  neurology and Neurosurgery  Time spent 30 minutes-Greater than 50% of this time was spent in counseling, explanation of diagnosis, planning of further management, and coordination of care.  MEDICATIONS: Scheduled Meds: .  stroke: mapping our early stages of recovery book   Does not apply Once  . dexamethasone  10 mg Intravenous 4 times per day  . ferrous sulfate  325 mg Oral Q breakfast   Continuous Infusions:  PRN Meds:.acetaminophen, oxyCODONE-acetaminophen, senna-docusate, zolpidem    PHYSICAL EXAM: Vital signs in last 24 hours: Filed Vitals:   09/11/15 1139 09/11/15 1245 09/11/15 1419 09/11/15 1602  BP: 146/76 140/70 151/66   Pulse: 62 63 66   Temp:   98.1 F (36.7 C) 97.8 F (36.6 C)  TempSrc:   Oral   Resp: 18 15 18    Height:      Weight:      SpO2: 97% 97% 98%     Weight change:  Filed Weights   09/10/15 1617  Weight: 55.792 kg (123 lb)   Body mass index is 21.79 kg/(m^2).   Gen Exam: Awake and alert with clear speech. Appears cachectic Neck: Supple, No JVD.   Chest: B/L Clear.   CVS: S1 S2 Regular, no murmurs.  Abdomen: soft, BS +, non tender, non distended.  Extremities: no edema, lower extremities warm to touch. Neurologic: Non Focal.   Skin: No Rash.   Wounds:  N/A.    Intake/Output from previous day: No intake or output data in the 24 hours ending 09/11/15 1607   LAB RESULTS: CBC  Recent Labs Lab 09/10/15 2022  WBC 6.0  HGB 10.7*  HCT 33.1*  PLT 154  MCV 74.5*  MCH 24.1*  MCHC 32.3  RDW 15.7*  LYMPHSABS 2.1  MONOABS 1.0  EOSABS 0.2  BASOSABS 0.1    Chemistries   Recent Labs Lab 09/10/15 2022 09/11/15 0827  NA 140  140  K 4.8 4.5  CL 103 106  CO2 26 24  GLUCOSE 113* 127*  BUN 15 11  CREATININE 1.03* 0.93  CALCIUM 9.4 9.3    CBG: No results for input(s): GLUCAP in the last 168 hours.  GFR Estimated Creatinine Clearance: 46.6 mL/min (by C-G formula based on Cr of 0.93).  Coagulation profile  Recent Labs Lab 09/11/15 0028  INR 1.06    Cardiac Enzymes  Recent Labs Lab 09/11/15 0028 09/11/15 0520 09/11/15 1301  TROPONINI 0.14* 0.14* 0.12*    Invalid input(s): POCBNP No results for input(s): DDIMER in the last 72 hours. No results for input(s): HGBA1C in the last 72 hours.  Recent Labs  09/11/15 0520  CHOL 148  HDL 55  LDLCALC 84  TRIG 44  CHOLHDL 2.7   No results for input(s): TSH, T4TOTAL, T3FREE, THYROIDAB in the last 72 hours.  Invalid input(s): FREET3 No results for input(s): VITAMINB12, FOLATE, FERRITIN, TIBC, IRON, RETICCTPCT in the last 72 hours. No results for input(s): LIPASE, AMYLASE in the last 72 hours.  Urine Studies No results for input(s): UHGB, CRYS in the last 72 hours.  Invalid input(s): UACOL, UAPR, USPG, UPH, UTP, UGL, UKET, UBIL, UNIT, UROB, ULEU, UEPI, UWBC, URBC, UBAC, CAST, UCOM, BILUA  MICROBIOLOGY: Recent Results (from the past 240 hour(s))  MRSA PCR Screening     Status: None   Collection Time: 09/11/15  2:18 PM  Result Value Ref Range Status   MRSA by PCR NEGATIVE NEGATIVE Final    Comment:        The GeneXpert MRSA Assay (FDA approved for NASAL specimens only), is one component of a comprehensive MRSA colonization surveillance program. It is not intended to diagnose MRSA infection nor to guide or monitor treatment for MRSA infections.     RADIOLOGY STUDIES/RESULTS: Ct Head Wo Contrast  09/10/2015  ADDENDUM REPORT: 09/10/2015 20:50 ADDENDUM: The PA taking care the patient returned my call. Results were given verbally on 09/10/2015 at 8:49 pm to [facility] , who verbally acknowledged these results. Electronically Signed    By: Rolm Baptise M.D.   On: 09/10/2015 20:50  09/10/2015  CLINICAL DATA:  Fall last night, head injury. EXAM: CT HEAD WITHOUT CONTRAST CT CERVICAL SPINE WITHOUT CONTRAST TECHNIQUE: Multidetector CT imaging of the head and cervical spine was performed following the standard protocol without intravenous contrast. Multiplanar CT image reconstructions of the cervical spine were also generated. COMPARISON:  None. FINDINGS: CT HEAD FINDINGS There is an area of hemorrhage within the right frontal lobe measuring up to 2.5 cm. There is extensive subcortical and deep white matter hypodensity throughout the right cerebral hemisphere, which appears to be out of proportion to the bleed. Also, some areas of hypodensity are remote to the bleed. I cannot exclude underlying mass lesions within the right cerebral hemisphere. No significant midline shift. No hydrocephalus. No acute calvarial abnormality or scalp swelling. Visualized paranasal sinuses and mastoids clear. Orbital soft tissues unremarkable. CT CERVICAL SPINE FINDINGS Alignment is normal. Prevertebral  soft tissues are normal. Early degenerative spurring and disc space narrowing. Mild degenerative facet disease diffusely. No fracture. No epidural or paraspinal hematoma. No acute spreads that IMPRESSION: 2.5 cm hemorrhage within the right frontal lobe with extensive white matter hypodensity throughout the right cerebral hemisphere. While this could be posttraumatic, given the degree of hypodensity throughout the right cerebral hemisphere, I cannot exclude underlying brain lesions. Recommend further evaluation with MRI with and without contrast. No acute bony abnormality in the cervical spine. Attempted to call the Dr. Lillette Boxer care of this patient. She instructed me that she would have to call me back shortly. Electronically Signed: By: Rolm Baptise M.D. On: 09/10/2015 20:28   Ct Chest W Contrast  09/11/2015  CLINICAL DATA:  Carcinoid.  Intracranial hemorrhage. EXAM: CT  CHEST WITH CONTRAST TECHNIQUE: Multidetector CT imaging of the chest was performed during intravenous contrast administration. CONTRAST:  70mL OMNIPAQUE IOHEXOL 300 MG/ML  SOLN COMPARISON:  None. FINDINGS: THORACIC INLET/BODY WALL: Partly visible volume loss in the submandibular right neck which appears postsurgical. Bilateral thyroid nodules, up to 15 mm on the right, likely incidental. No adenopathy. MEDIASTINUM: Normal heart size. Small low-density pericardial effusion. No acute vascular abnormality. No adenopathy. LUNG WINDOWS: Mild dependent atelectasis. There is no edema, consolidation, effusion, or pneumothorax. No suspicious pulmonary nodule. UPPER ABDOMEN: Partly visible central hepatic cyst. Patchy heterogeneous enhancement of the spleen is likely physiologic (in the abdomen this is arterial phase) OSSEOUS: No acute fracture.  No suspicious lytic or blastic lesions. IMPRESSION: No primary malignancy or metastatic disease identified in the chest. Electronically Signed   By: Monte Fantasia M.D.   On: 09/11/2015 01:14   Ct Cervical Spine Wo Contrast  09/10/2015  ADDENDUM REPORT: 09/10/2015 20:50 ADDENDUM: The PA taking care the patient returned my call. Results were given verbally on 09/10/2015 at 8:49 pm to [facility] , who verbally acknowledged these results. Electronically Signed   By: Rolm Baptise M.D.   On: 09/10/2015 20:50  09/10/2015  CLINICAL DATA:  Fall last night, head injury. EXAM: CT HEAD WITHOUT CONTRAST CT CERVICAL SPINE WITHOUT CONTRAST TECHNIQUE: Multidetector CT imaging of the head and cervical spine was performed following the standard protocol without intravenous contrast. Multiplanar CT image reconstructions of the cervical spine were also generated. COMPARISON:  None. FINDINGS: CT HEAD FINDINGS There is an area of hemorrhage within the right frontal lobe measuring up to 2.5 cm. There is extensive subcortical and deep white matter hypodensity throughout the right cerebral  hemisphere, which appears to be out of proportion to the bleed. Also, some areas of hypodensity are remote to the bleed. I cannot exclude underlying mass lesions within the right cerebral hemisphere. No significant midline shift. No hydrocephalus. No acute calvarial abnormality or scalp swelling. Visualized paranasal sinuses and mastoids clear. Orbital soft tissues unremarkable. CT CERVICAL SPINE FINDINGS Alignment is normal. Prevertebral soft tissues are normal. Early degenerative spurring and disc space narrowing. Mild degenerative facet disease diffusely. No fracture. No epidural or paraspinal hematoma. No acute spreads that IMPRESSION: 2.5 cm hemorrhage within the right frontal lobe with extensive white matter hypodensity throughout the right cerebral hemisphere. While this could be posttraumatic, given the degree of hypodensity throughout the right cerebral hemisphere, I cannot exclude underlying brain lesions. Recommend further evaluation with MRI with and without contrast. No acute bony abnormality in the cervical spine. Attempted to call the Dr. Lillette Boxer care of this patient. She instructed me that she would have to call me back shortly. Electronically Signed: By: Lennette Bihari  [place] M.D. On: 09/10/2015 20:28   Mr Jeri Cos [id number] Contrast  09/10/2015  CLINICAL DATA:  [place] yesterday, striking head on dresser. Progressive weakness. Follow-up intracranial hemorrhage. History of carcinoid tumor. EXAM: MRI HEAD WITHOUT AND WITH CONTRAST TECHNIQUE: Multiplanar, multiecho pulse sequences of the brain and surrounding structures were obtained without and with intravenous contrast. CONTRAST:  62mL MULTIHANCE GADOBENATE DIMEGLUMINE 529 MG/ML IV SOLN COMPARISON:  CT head September 10, 2015 FINDINGS: RIGHT frontal intraparenchymal hemorrhage with peripheral T1 shortening and extensive surrounding T2 bright edema. Superimposed smooth rind of peripheral enhancement, in total measuring 5.3 x 3.3 cm. RIGHT frontal local mass effect,  resulting in 2 mm RIGHT to LEFT midline shift. Mass effect on RIGHT frontal horn of lateral ventricle without hydrocephalus or entrapment. Susceptibility artifact within the RIGHT frontoparietal subarachnoid spaces. At least 2 subcentimeter foci of reduced diffusion LEFT cerebellum, in addition to subcentimeter focus of reduced diffusion bilateral occipital lobes, bilateral parietal lobes and, LEFT frontal lobe. 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 RIGHT parietal occipital lobe encephalomalacia with intrinsic T1 shortening consistent with laminar necrosis. Patchy to confluent additional supratentorial white matter FLAIR T2 hyperintensities compatible with subarachnoid hemorrhage. 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 Major intracranial vascular flow voids present at skull base. Ocular globes and orbital contents are unremarkable. Fluid-filled expanded empty sella. No cerebellar tonsillar ectopia. No suspicious calvarial bone marrow signal. IMPRESSION: Subacute RIGHT frontal lobe 5.3 x 3.3 cm hemorrhage, with disproportionate marginal enhancement and mass effect, concerning for underlying mass though not distinctly demonstrated. Recommend 10-14 day follow-up MRI of the brain with contrast. RIGHT frontoparietal subarachnoid hemorrhage. Multiple subcentimeter foci of acute ischemia (less likely nonenhancing metastasis) within the supra and infratentorial brain, predominately in posterior circulation favoring embolic phenomena. 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 Electronically Signed   By: Elon Alas M.D.   On: 09/10/2015 22:53    Oren Binet, MD  [facility] Pager:[phone]  If 7PM-7AM, please contact night-coverage [email or web address] Password [email or web address] 09/11/2015, 4:07 PM   LOS: 1 day

## 2015-09-11 NOTE — Progress Notes (Signed)
Patient ID: Andrea Keller, female   DOB: [birth year], 70 y.o.   MRN: [id number] Results of MRI noted appreciate neurology and internal medicine's workup and evaluation. I do not see any neurosurgical intervention at this time. If neurology feels on repeat imaging or workup that is not just an embolic phenomena but that the patient does have an underlying mass on follow-up imaging, please reconsult and refer.

## 2015-09-11 NOTE — ED Notes (Signed)
Neurology at bedside.

## 2015-09-11 NOTE — Progress Notes (Signed)
STROKE TEAM PROGRESS NOTE   HISTORY OF PRESENT ILLNESS Andrea Keller is a 70 y.o. 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 She states that this happened rather suddenly last Monday 3/6. It has been persistent since that time. She fell and that is what prompted her coming to the emergency room where CT was performed showing the fairly large ICH. Neurosurgery was consulted who evaluated the patient recommend an MRI. MRI shows edema surrounding ICH as well as rim enhancement of the ICH, which radiology feels is concerning for, but not diagnostic of malignancy. She has been started on IV Decadron.  LKW: 3/6 tpa given?: no, ICH Premorbid modified rankin scale: 0 ICH Score: 0  SUBJECTIVE (INTERVAL HISTORY) Her husband is at the bedside.  Overall she feels her condition is gradually improving. HA better. She and her husband recounted her colon cancer history with me (see below).   OBJECTIVE Temp:  [98.3 F (36.8 C)-98.8 F (37.1 C)] 98.3 F (36.8 C) (03/12 0744) Pulse Rate:  [59-95] 62 (03/12 0744) Cardiac Rhythm:  [-] Normal sinus rhythm (03/11 1949) Resp:  [11-18] 14 (03/12 0744) BP: (93-132)/(50-89) 115/89 mmHg (03/12 0744) SpO2:  [95 %-100 %] 96 % (03/12 0744) Weight:  [55.792 kg (123 lb)] 55.792 kg (123 lb) (03/11 1617)  CBC:  Recent Labs Lab 09/10/15 2022  WBC 6.0  NEUTROABS 2.7  HGB 10.7*  HCT 33.1*  MCV 74.5*  PLT 123456    Basic Metabolic Panel:  Recent Labs Lab 09/10/15 2022  NA 140  K 4.8  CL 103  CO2 26  GLUCOSE 113*  BUN 15  CREATININE 1.03*  CALCIUM 9.4    Lipid Panel:    Component Value Date/Time   CHOL 148 09/11/2015 0520   TRIG 44 09/11/2015 0520   HDL 55 09/11/2015 0520   CHOLHDL 2.7 09/11/2015 0520   VLDL 9 09/11/2015 0520   LDLCALC 84 09/11/2015 0520   HgbA1c: No results found for: HGBA1C Urine Drug Screen: No results found for: LABOPIA,  COCAINSCRNUR, LABBENZ, AMPHETMU, THCU, LABBARB    IMAGING I have personally reviewed the radiological images below and agree with the radiology interpretations.  Ct Head Wo Contrast 09/10/2015   2.5 cm hemorrhage within the right frontal lobe with extensive white matter hypodensity throughout the right cerebral hemisphere. While this could be posttraumatic, given the degree of hypodensity throughout the right cerebral hemisphere, I cannot exclude underlying brain lesions. Recommend further evaluation with MRI with and without contrast.   Ct Chest W Contrast 09/11/2015   No primary malignancy or metastatic disease identified in the chest.   Ct Cervical Spine Wo Contrast 09/10/2015   No acute bony abnormality in the cervical spine.   Mr [name] Contrast 09/10/2015   Subacute RIGHT frontal lobe 5.3 x 3.3 cm hemorrhage, with disproportionate marginal enhancement and mass effect, concerning for underlying mass though not distinctly demonstrated. Recommend 10-14 day follow-up MRI of the brain with contrast.  RIGHT frontoparietal subarachnoid hemorrhage. Multiple subcentimeter foci of acute ischemia (less likely nonenhancing metastasis) within the supra and infratentorial brain, predominately in posterior circulation favoring embolic phenomena. 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   MRA of the neck - pending  MRA of the brain - pending  CT abdomen and pelvis 08/2015 -  Interval omentectomy and right hemicolectomy with ileocolonic anastomosis. No bowel obstruction. No definite metastatic disease identified within the abdomen and pelvis.  Indeterminate hepatic hypodensities and nodular thickening of the lateral limb left adrenal gland are similar to prior. Recommend continued attention on follow-up. Other unchanged findings as above.  US thyroid - stable bilateral  thyroid nodules.  CT chest 12/2014 Chest wall/thoracic inlet: Conglomeration of several nonenlarged lymph nodes in the right supraclavicular region. Thyroid: There is a 1.8 x 0.9 cm right thyroid nodule. Mediastinum/hila: There is a 9 mm left paraesophageal lymph node on series 2 image 83. Small hiatal hernia. Heart/vessels: Coronary atherosclerosis. Aortic valve calcifications. Aortic and branch vessel atherosclerosis. Small amount of pericardial fluid. Lungs: Right middle lobe segmental atelectasis. Pleura: Small right pleural effusion decreased from prior. There is a 3 mm right lower lobe nodule on series 4 image 67.  2D echo - pending   PHYSICAL EXAM  Temp:  [98.1 F (36.7 C)-98.8 F (37.1 C)] 98.1 F (36.7 C) (03/12 1419) Pulse Rate:  [57-95] 66 (03/12 1419) Resp:  [11-20] 18 (03/12 1419) BP: (93-151)/(50-89) 151/66 mmHg (03/12 1419) SpO2:  [95 %-100 %] 98 % (03/12 1419) Weight:  [123 lb (55.792 kg)] 123 lb (55.792 kg) (03/11 1617)  General - Well nourished, well developed, in no apparent distress.  Ophthalmologic - Fundi not visualized due to eye movement.  Cardiovascular - Regular rate and rhythm with no murmur.  Mental Status -  Level of arousal and orientation to time, place, and person were intact. Language including expression, naming, repetition, comprehension was assessed and found intact. Fund of Knowledge was assessed and was intact.  Cranial Nerves II - XII - II - Visual field intact OU. III, IV, VI - Extraocular movements intact. V - Facial sensation intact bilaterally. VII - left nasolabial fold flattening. VIII - Hearing & vestibular intact bilaterally. X - Palate elevates symmetrically. XI - Chin turning & shoulder shrug intact bilaterally. XII - Tongue protrusion intact.  Motor Strength - The patient's strength was normal in all extremities and pronator drift was absent.  Bulk was normal and fasciculations were absent.   Motor Tone - Muscle tone was  assessed at the neck and appendages and was normal.  Reflexes - The patient's reflexes were 1+ in all extremities and she had no pathological reflexes.  Sensory - Light touch, temperature/pinprick were assessed and were symmetrical.    Coordination - The patient had normal movements in the hands and feet with no ataxia or dysmetria.  Tremor was absent.  Gait and Station - not tested due to safety concerns.    ASSESSMENT/PLAN Ms. Andrea Keller is a 70 y.o. female with history of PVD, hypertension, colon cancer s/p hemicolectomy 12/2014,  HLD, and recent fall with head injury presenting with intracranial hemorrhage.  She did not receive IV t-PA due to hemorrhage.  Right frontal ICH - hemorrhagic metastasis vs. hemorrhagic infarct    Resultant  HA and left facial nasolabial fold flattening  MRI - Subacute RIGHT frontal ICH with marginal enhancement, concerning for maligancy.   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  Patient counseled to be compliant with her antithrombotic medications  Ongoing aggressive stroke risk factor management  Therapy recommendations: Pending  Disposition: Pending  Hx of colon cancer  S/p hemicolectomy in [facility] in 12/2014  Recent pan CT did not show metastasis  Follow up with oncologist in [facility]  Repeat MRI with contrast in 2-4 weeks after ICH resolution to rule out metastasis to brain  paraclinoid  aneurysm  7x68mm  Refer to Dr. [name] as outpt.  Hypertension  BP goal < 160.  stable  Hyperlipidemia  Home meds:  Crestor 10 mg daily resumed in hospital  LDL 84, goal < 70  Hold Crestor secondary to [place]  Continue statin at discharge  Other Stroke Risk Factors  Advanced age  Cigarette smoker, quit smoking   Hx stroke/TIA - by MRI  Other Active Problems  Anemia  Mildly elevated creatinine  Hospital day # 1  Rosalin Hawking, MD PhD Stroke Neurology 09/11/2015 3:09 PM      To contact Stroke Continuity provider, please refer to [email or web address] After hours, contact General Neurology

## 2015-09-12 ENCOUNTER — Inpatient Hospital Stay (HOSPITAL_COMMUNITY): Payer: Medicare Other

## 2015-09-12 DIAGNOSIS — I10 Essential (primary) hypertension: Secondary | ICD-10-CM | POA: Insufficient documentation

## 2015-09-12 DIAGNOSIS — I619 Nontraumatic intracerebral hemorrhage, unspecified: Secondary | ICD-10-CM

## 2015-09-12 DIAGNOSIS — C189 Malignant neoplasm of colon, unspecified: Secondary | ICD-10-CM | POA: Insufficient documentation

## 2015-09-12 DIAGNOSIS — E785 Hyperlipidemia, unspecified: Secondary | ICD-10-CM | POA: Insufficient documentation

## 2015-09-12 LAB — CBC
HEMATOCRIT: 34.1 % — AB (ref 36.0–46.0)
HEMOGLOBIN: 10.7 g/dL — AB (ref 12.0–15.0)
MCH: 23.1 pg — AB (ref 26.0–34.0)
MCHC: 31.4 g/dL (ref 30.0–36.0)
MCV: 73.7 fL — ABNORMAL LOW (ref 78.0–100.0)
Platelets: 142 10*3/uL — ABNORMAL LOW (ref 150–400)
RBC: 4.63 MIL/uL (ref 3.87–5.11)
RDW: 15.4 % (ref 11.5–15.5)
WBC: 7.8 10*3/uL (ref 4.0–10.5)

## 2015-09-12 LAB — TROPONIN I
TROPONIN I: 0.09 ng/mL — AB (ref <0.031)
Troponin I: 0.09 ng/mL — ABNORMAL HIGH (ref <0.031)
Troponin I: 0.09 ng/mL — ABNORMAL HIGH (ref <0.031)
Troponin I: 0.1 ng/mL — ABNORMAL HIGH (ref <0.031)

## 2015-09-12 LAB — HEMOGLOBIN A1C
Hgb A1c MFr Bld: 6 % — ABNORMAL HIGH (ref 4.8–5.6)
Mean Plasma Glucose: 126 mg/dL

## 2015-09-12 MED ORDER — ROSUVASTATIN CALCIUM 10 MG PO TABS
10.0000 mg | ORAL_TABLET | Freq: Every day | ORAL | Status: DC
  Administered 2015-09-12 – 2015-09-14 (×3): 10 mg via ORAL
  Filled 2015-09-12 (×3): qty 1

## 2015-09-12 MED ORDER — GADOBENATE DIMEGLUMINE 529 MG/ML IV SOLN
10.0000 mL | Freq: Once | INTRAVENOUS | Status: AC | PRN
  Administered 2015-09-12: 10 mL via INTRAVENOUS

## 2015-09-12 NOTE — Progress Notes (Signed)
Pt transferred from [facility], alert and oriented, denies any pain , pt settled in bed with call light and family at bedside, v/s stable, will however continue to monitor. Obasogie-Asidi, Nely Dedmon Efe

## 2015-09-12 NOTE — Progress Notes (Signed)
STROKE TEAM PROGRESS NOTE   HISTORY OF PRESENT ILLNESS Andrea Keller is a 70 y.o. 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 She states that this happened rather suddenly last Monday 3/6. It has been persistent since that time. She fell and that is what prompted her coming to the emergency room where CT was performed showing the fairly large ICH. Neurosurgery was consulted who evaluated the patient recommend an MRI. MRI shows edema surrounding ICH as well as rim enhancement of the ICH, which radiology feels is concerning for, but not diagnostic of malignancy. She has been started on IV Decadron.  LKW: 3/6 tpa given?: no, ICH Premorbid modified rankin scale: 0 ICH Score: 0  SUBJECTIVE (INTERVAL HISTORY) Her daughter is at the bedside.  Overall she feels her condition is stable. PT/OT and daughter reported confusion and difficulty with tasks. I saw the patient while she is in bathroom brushing her teeth. She was slow and tends to have repetitive actions. EEG ordered to rule out seizure.   OBJECTIVE Temp:  [97.8 F (36.6 C)-98.2 F (36.8 C)] 98.2 F (36.8 C) (03/13 0842) Pulse Rate:  [50-66] 50 (03/13 0410) Cardiac Rhythm:  [-] Sinus bradycardia (03/13 0700) Resp:  [11-18] 12 (03/13 0410) BP: (134-151)/(61-71) 150/68 mmHg (03/13 0410) SpO2:  [96 %-98 %] 97 % (03/13 0410)  CBC:   Recent Labs Lab 09/10/15 2022 09/12/15 0706  WBC 6.0 7.8  NEUTROABS 2.7  --   HGB 10.7* 10.7*  HCT 33.1* 34.1*  MCV 74.5* 73.7*  PLT 154 142*    Basic Metabolic Panel:   Recent Labs Lab 09/10/15 2022 09/11/15 0827  NA 140 140  K 4.8 4.5  CL 103 106  CO2 26 24  GLUCOSE 113* 127*  BUN 15 11  CREATININE 1.03* 0.93  CALCIUM 9.4 9.3    Lipid Panel:     Component Value Date/Time   CHOL 148 09/11/2015 0520   TRIG 44 09/11/2015 0520   HDL 55 09/11/2015 0520   CHOLHDL 2.7 09/11/2015 0520   VLDL 9 09/11/2015 0520   LDLCALC 84 09/11/2015 0520   HgbA1c:  Lab Results  Component Value Date   HGBA1C 6.0* 09/11/2015   Urine Drug Screen: No results found for: LABOPIA, COCAINSCRNUR, LABBENZ, AMPHETMU, THCU, LABBARB    IMAGING I have personally reviewed the radiological images below and agree with the radiology interpretations.  Ct Head Wo Contrast 09/10/2015   2.5 cm hemorrhage within the right frontal lobe with extensive white matter hypodensity throughout the right cerebral hemisphere. While this could be posttraumatic, given the degree of hypodensity throughout the right cerebral hemisphere, I cannot exclude underlying brain lesions. Recommend further evaluation with MRI with and without contrast.   Ct Chest W Contrast 09/11/2015   No primary malignancy or metastatic disease identified in the chest.   Ct Cervical Spine Wo Contrast 09/10/2015   No acute bony abnormality in the cervical spine.   Mr [name] Contrast 09/10/2015   Subacute RIGHT frontal lobe 5.3 x 3.3 cm hemorrhage, with disproportionate marginal enhancement and mass effect, concerning for underlying mass though not distinctly demonstrated. Recommend 10-14 day follow-up MRI of the brain with contrast.  RIGHT frontoparietal subarachnoid hemorrhage. Multiple subcentimeter foci of acute ischemia (less likely nonenhancing metastasis) within the supra and infratentorial brain, predominately in posterior circulation favoring embolic phenomena. 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   MRA of head and neck -  MRA HEAD: Focally ectatic bilateral carotid termini, in part related to posterior communicating artery infundibulum. No emergent large vessel occlusion or acute vascular process. Mild luminal irregularity of cerebral arteries compatible with atherosclerosis. MRA NECK: Negative.  CT  abdomen and pelvis 08/2015 -  Interval omentectomy and right hemicolectomy with ileocolonic anastomosis. No bowel obstruction. No definite metastatic disease identified within the abdomen and pelvis.  Indeterminate hepatic hypodensities and nodular thickening of the lateral limb left adrenal gland are similar to prior. Recommend continued attention on follow-up. Other unchanged findings as above.  US thyroid - stable bilateral thyroid nodules.  CT chest 12/2014 Chest wall/thoracic inlet: Conglomeration of several nonenlarged lymph nodes in the right supraclavicular region. Thyroid: There is a 1.8 x 0.9 cm right thyroid nodule. Mediastinum/hila: There is a 9 mm left paraesophageal lymph node on series 2 image 83. Small hiatal hernia. Heart/vessels: Coronary atherosclerosis. Aortic valve calcifications. Aortic and branch vessel atherosclerosis. Small amount of pericardial fluid. Lungs: Right middle lobe segmental atelectasis. Pleura: Small right pleural effusion decreased from prior. There is a 3 mm right lower lobe nodule on series 4 image 67.  2D echo - pending  EEG - pending   PHYSICAL EXAM  Temp:  [97.8 F (36.6 C)-98.2 F (36.8 C)] 98.2 F (36.8 C) (03/13 0842) Pulse Rate:  [50-66] 50 (03/13 0410) Resp:  [11-18] 12 (03/13 0410) BP: (134-151)/(61-71) 150/68 mmHg (03/13 0410) SpO2:  [96 %-98 %] 97 % (03/13 0410)  General - Well nourished, well developed, in no apparent distress.  Ophthalmologic - Fundi not visualized due to eye movement.  Cardiovascular - Regular rate and rhythm with no murmur.  Mental Status -  Level of arousal and orientation to time, place, and person were intact. Language including expression, naming, repetition, comprehension was assessed and found intact. Able to follow two-step commands. She dose have psychomotor slowing on tasks with repetitive movement.  Fund of Knowledge was assessed and was intact.  Cranial Nerves II - XII - II - Visual field  intact OU. III, IV, VI - Extraocular movements intact. V - Facial sensation intact bilaterally. VII - left nasolabial fold flattening. VIII - Hearing & vestibular intact bilaterally. X - Palate elevates symmetrically. XI - Chin turning & shoulder shrug intact bilaterally. XII - Tongue protrusion intact.  Motor Strength - The patient's strength was normal in all extremities and pronator drift was absent.  Bulk was normal and fasciculations were absent.   Motor Tone - Muscle tone was assessed at the neck and appendages and was normal.  Reflexes - The patient's reflexes were 1+ in all extremities and she had no pathological reflexes.  Sensory - Light touch, temperature/pinprick were assessed and were symmetrical.    Coordination - The patient had normal movements in the hands with no ataxia or dysmetria but slow in action.  Tremor was absent.  Gait and Station - not tested due to safety concerns.    ASSESSMENT/PLAN Andrea Keller is a 70 y.o. female with history of PVD, hypertension, colon cancer s/p hemicolectomy 12/2014,  HLD, and recent fall with head injury presenting with intracranial hemorrhage.  She did not receive IV t-PA due to hemorrhage.  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  Patient counseled to be compliant with her antithrombotic medications  Ongoing aggressive stroke risk factor management  Therapy recommendations: Pending  Disposition: Pending  Confusion - frontal ICH vs. Seizure  Reported intermittent confusion by therapist and daughter  EEG - pending to evaluate for seizure. Long term EEG if needed  Worsening cognitive impairment in the setting of frontal ICH or underlying malignancy.  Supportive care  Maintain normal wake-sleep cycle.  Hx of colon cancer  S/p hemicolectomy in [facility] in 12/2014  Recent pan CT did not show metastasis  Follow up with oncologist in [facility]  Repeat MRI with contrast in 2-4 weeks after ICH resolution to rule out metastasis to brain  paraclinoid aneurysm  7x3mm  Refer to Dr. [name] as outpt.  Hypertension  BP goal < 160 due to ICH  stable  Hyperlipidemia  Home meds:  Crestor 10 mg daily resumed in hospital  LDL 84, goal < 70  Continue statin at discharge  Other Stroke Risk Factors  Advanced age  Cigarette smoker, quit smoking   Hx stroke/TIA - by MRI  Other Active Problems  Anemia  Mildly elevated creatinine  Hospital day # 2  Rosalin Hawking, MD PhD Stroke Neurology 09/12/2015 1:13 PM      To contact Stroke Continuity provider, please refer to [email or web address] After hours, contact General Neurology

## 2015-09-12 NOTE — Progress Notes (Signed)
Bedside EEG completed, results pending. 

## 2015-09-12 NOTE — Progress Notes (Addendum)
Occupational Therapy Evaluation Patient Details Name: Andrea Keller MRN: [id number] DOB: 27-Jul-1945 Today's Date: 09/12/2015    History of Present Illness Pt is a 70 y/o F who presented w/ one week of headache, difficulty w/ memory, trouble concentreating.  She fell, which prompted her to come to the ED.  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  Plan is to repeat MRI in 2-4 weeks after ICH resolution to rule out metastasis to the brain. Pt's PMH inlcudes diverticulosis, insomnia, PVD, goblet cell carcinoid.     Clinical Impression   PTA, pt lived at home with husband and was independent with ADL and mobility. Pt presents with significant cognitive and visual perceptual deficits, decreasing her ability to complete functional tasks safely. R bias to visual field and body with apparent L neglect. Decreased awareness of deficits. Feel pt is an excellent CIR candidate. Feel rehab will be beneficial to maximize pt's level of independence in addition to educating family on perceptual and cognitive deficits in order to decrease burden of care and facilitate safe return home with 24/7 S of family. Will follow acutely to address established goals and facilitate safe D/C to next venue of care.     Follow Up Recommendations  CIR;Supervision/Assistance - 24 hour    Equipment Recommendations  3 in 1 bedside comode    Recommendations for Other Services Rehab consult     Precautions / Restrictions Precautions Precautions: Fall Restrictions Weight Bearing Restrictions: No      Mobility Bed Mobility Overal bed mobility: Needs Assistance Bed Mobility: Supine to Sit     Supine to sit: Min assist     General bed mobility comments: unable to figure out how to get out of bed on L side  Transfers Overall transfer level: Needs assistance Equipment used: None;1 person hand held assist    Sit to Stand: Min assist         General transfer comment: Slow to stand and requires min assist to steady due to LOB standing from commode    Balance     Sitting balance-Leahy Scale: Good       Standing balance-Leahy Scale: Fair - LOB to L when distracted, requiring assist to prevent fall                              ADL Overall ADL's : Needs assistance/impaired   Eating/Feeding Details (indicate cue type and reason): will further assess Grooming: Minimal assistance Grooming Details (indicate cue type and reason): washed hands at sink and walked away without drying hands.Required mod vc to find paper towel holder Upper Body Bathing: Minimal assitance   Lower Body Bathing: Moderate assistance   Upper Body Dressing : Moderate assistance   Lower Body Dressing: Moderate assistance Lower Body Dressing Details (indicate cue type and reason): perseerating on removing shoes. continued to try to put shoes back on while given command toremove shoes. Given command to hand therapist belt and pt continued to try to thread belt loop through clasp and was unable to terminate action to hand therapist bilet. Required hand over hand to terminate activity.             Functional mobility during ADLs: Minimal assistance General ADL Comments: Pt asked to get out of bed. Rail down on L and pt trying to get out on R side with rail. Pt requried  min a to get out of bed on left. Pt unable to reproduce 2 step command of follow functional sequence. Pt asked to walk to nursing station to get cup of water. Pt instead walked over to other side of bed which was on her R side. Pt unable to find her way back to her room when she was standing at the end of the nursing station. when asked to find her room number, she found it in the writing of the "purrell" hand sanitizer outside of her room.      Vision Vision Assessment?: Vision impaired- to be further tested in functional context Additional  Comments: visual inattention.   Perception Perception Perception Tested?: Yes Perception Deficits: Inattention/neglect;Spatial orientation;Topographical orientation Inattention/Neglect: Does not attend to left visual field (R bias) Spatial deficits: apparent difficulty with aptial awareness Comments: will further assess  Decreased attention to L with double simultaneous stimulation Unable to read name of magazine Unable to find page number in magazine Deficits became more apparent as environment was unstructured and more distractions were present   Praxis Praxis Praxis tested?: Deficits Deficits: Perseveration    Pertinent Vitals/Pain  no c/o pain     Hand Dominance Right   Extremity/Trunk Assessment Upper Extremity Assessment Upper Extremity Assessment: LUE deficits/detail LUE Deficits / Details: Difficulty engaging LUE spontaneously. alien like movements at times. R bias LUE Coordination: decreased fine motor   Lower Extremity Assessment Lower Extremity Assessment: Defer to PT evaluation   Cervical / Trunk Assessment Cervical / Trunk Assessment: Kyphotic   Communication Communication Communication: No difficulties   Cognition Arousal/Alertness: Awake/alert Behavior During Therapy: Restless Overall Cognitive Status: Impaired/Different from baseline Area of Impairment: Orientation;Attention;Memory;Safety/judgement;Following commands;Awareness;Problem solving Orientation Level: Disoriented to;Time Current Attention Level: Sustained Memory: Decreased short-term memory Following Commands: Follows one step commands with increased time Safety/Judgement: Decreased awareness of deficits;Decreased awareness of safety Awareness: Intellectual Problem Solving: Slow processing;Decreased initiation;Difficulty sequencing;Requires verbal cues;Requires tactile cues     General Comments       Exercises       Shoulder Instructions      Home Living Family/patient expects to  be discharged to:: Private residence Living Arrangements: Spouse/significant other;Children Available Help at Discharge: Family;Available 24 hours/day (husband) Type of Home: House Home Access: Stairs to enter [employer] of Steps: 4 Entrance Stairs-Rails: None Home Layout: One level     Bathroom Shower/Tub: Walk-in Programme researcher, broadcasting/film/video: Standard Bathroom Accessibility: Yes   Home Equipment: Environmental consultant - 2 wheels          Prior Functioning/Environment Level of Independence: Independent        Comments: still driving, Ind w/ all mobility and ADLs    OT Diagnosis: Generalized weakness;Cognitive deficits;Disturbance of vision;Blindness and low vision   OT Problem List: Decreased strength;Decreased activity tolerance;Impaired vision/perception;Decreased coordination;Decreased cognition;Decreased safety awareness;Decreased knowledge of precautions;Impaired UE functional use   OT Treatment/Interventions: Self-care/ADL training;Therapeutic exercise;Neuromuscular education;DME and/or AE instruction;Therapeutic activities;Cognitive remediation/compensation;Visual/perceptual remediation/compensation;Patient/family education;Balance training    OT Goals(Current goals can be found in the care plan section) Acute Rehab OT Goals Patient Stated Goal: to get back to cooking, cleaning, everyday tasks OT Goal Formulation: With patient/family Time For Goal Achievement: 09/26/15 Potential to Achieve Goals: Good ADL Goals Pt Will Perform Grooming: with supervision;standing Pt Will Transfer to Toilet: with supervision;ambulating Pt Will Perform Toileting - Clothing Manipulation and hygiene: with supervision;sit to/from stand Additional ADL Goal #1: Pt will complete trail following task to room with min vc Additional ADL Goal #2: Pt will consistently locate room numbers  on L with min vc to increase visual scanning and awareness to L field  OT Frequency: Min 2X/week    Barriers to D/C:    Pt is alone several hours per week while husband takes son to diallysis       Co-evaluation              End of Session Equipment Utilized During Treatment: Gait belt Nurse Communication: Mobility status  Activity Tolerance: Patient tolerated treatment well Patient left: in bed;with call bell/phone within reach;with bed alarm set;with family/visitor present   Time: [id number] OT Time Calculation (min): 36 min Charges:  OT General Charges $OT Visit: 1 Procedure OT Evaluation $OT Eval Moderate Complexity: 1 Procedure OT Treatments $Self Care/Home Management : 8-22 mins G-Codes:    Kasey Hansell,HILLARY 2015/09/15, 5:48 PM   [facility], OTR/L  [phone] 09-15-2015

## 2015-09-12 NOTE — Plan of Care (Signed)
Problem: Coping: Goal: Ability to identify strategies to decrease anxiety will improve Outcome: Adequate for Discharge Patient does not express feelings of anxiety.  Problem: Self-Care: Goal: Ability to participate in self-care as condition permits will improve Outcome: Adequate for Discharge Patient was able to ambulate the the bathroom with minimal assistance from RN and daughter.  She was able to wash herself and brush her teeth, although she did need to sit in a chair to do so.

## 2015-09-12 NOTE — Progress Notes (Signed)
Patient transferred to 5M19 on monitor. Belongings transferred with patient.  Pt's daughter was in room and is aware of transfer.  VSS.

## 2015-09-12 NOTE — Progress Notes (Signed)
PATIENT DETAILS Name: Andrea Keller Age: 70 y.o. Sex: female Date of Birth: 1946/05/29 Admit Date: 09/10/2015 Admitting Physician Ivor Costa, MD [place]:[id number] Bunnie Domino, MD  Subjective: Awake/alert-No headache.   Assessment/Plan: Principal Problem: Right frontal ICH: Non focal exam-except for slight left facial droop. 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 MRI Neck no significant stenosis. Await 2 D Echo. Spoke with Dr Erlinda Hong, plans are to repeat MRI brain in 2-4 weeks after ICH resolution to definitely rule out metastasis to the brain. He also recommends a 30 day cardiac monitor. LDL 84, A1C 6.0. Avoid ASA-given ICH-continue Decadron. Given intermittent confusion-check EEG.  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 Most recent CT Abd 2/24 at [facility] negative for malignancy as well (reviwed report in care everywhere). However has microcytic anemia-but colonoscopy on 2015 was negative.Briefly curbsided Dr Carlean Purl on 4/12-he does not think a repeat Colonoscopy is warranted at this time.   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 Troponin trend is flat and not consistent with ACS. Given ICH-unable to use anti-platelets or pursue further work up. Await Echo.   [id number]  Microcytic Anemia:check anemia panel and  FOBT  Disposition: Remain inpatient-transfer to telemetry  Antimicrobial agents  See below  Anti-infectives    None      DVT Prophylaxis:  SCD's  Code Status: Full code   Family Communication Daughter at bedside  Procedures: None  CONSULTS:  neurology and Neurosurgery  Time spent 30 minutes-Greater than 50% of this time was spent in counseling, explanation of diagnosis, planning of further management, and coordination of care.  MEDICATIONS: Scheduled Meds: . dexamethasone  10 mg Intravenous 4 times per day  . ferrous sulfate  325 mg Oral Q breakfast  . pantoprazole  40 mg Oral Q1200   Continuous Infusions:  PRN Meds:.acetaminophen, ondansetron (ZOFRAN) IV, oxyCODONE-acetaminophen, senna-docusate, zolpidem    PHYSICAL EXAM: Vital signs in last 24 hours: Filed Vitals:   09/11/15 2300 09/12/15 0405 09/12/15 0410 09/12/15 0842  BP: 142/65  150/68   Pulse: 61  50   Temp:  97.8 F (36.6 C)  98.2 F (36.8 C)  TempSrc:  Oral  Oral  Resp: 11  12   Height:      Weight:      SpO2: 97%  97%     Weight change:  Filed Weights   09/10/15 1617  Weight: 55.792 kg (123 lb)   Body mass index is 21.79 kg/(m^2).   Gen Exam: Awake and alert with clear speech. Appears cachectic Neck: Supple, No JVD.   Chest: B/L Clear.  No rales CVS: S1 S2 Regular, no murmurs.  Abdomen: soft, BS +, non tender, non distended.  Extremities: no edema, lower extremities warm to touch. Neurologic: Non Focal.   Skin: No Rash.   Wounds: N/A.    Intake/Output from previous day:  Intake/Output Summary (Last 24 hours) at  09/12/15 1008 Last data filed at 09/12/15 0000  Gross per 24 hour  Intake    720 ml  Output      0 ml  Net    720 ml     LAB RESULTS: CBC  Recent Labs Lab 09/10/15 2022 09/12/15 0706  WBC 6.0 7.8  HGB 10.7* 10.7*  HCT 33.1* 34.1*  PLT 154 142*  MCV 74.5* 73.7*  MCH 24.1* 23.1*  MCHC 32.3 31.4  RDW 15.7* 15.4  LYMPHSABS 2.1  --   MONOABS 1.0  --    EOSABS 0.2  --   BASOSABS 0.1  --     Chemistries   Recent Labs Lab 09/10/15 2022 09/11/15 0827  NA 140 140  K 4.8 4.5  CL 103 106  CO2 26 24  GLUCOSE 113* 127*  BUN 15 11  CREATININE 1.03* 0.93  CALCIUM 9.4 9.3    CBG: No results for input(s): GLUCAP in the last 168 hours.  GFR Estimated Creatinine Clearance: 46.6 mL/min (by C-G formula based on Cr of 0.93).  Coagulation profile  Recent Labs Lab 09/11/15 0028  INR 1.06    Cardiac Enzymes  Recent Labs Lab 09/11/15 1908 09/12/15 0209 09/12/15 0706  TROPONINI 0.11* 0.09* 0.09*    Invalid input(s): POCBNP No results for input(s): DDIMER in the last 72 hours.  Recent Labs  09/11/15 0520  HGBA1C 6.0*    Recent Labs  09/11/15 0520  CHOL 148  HDL 55  LDLCALC 84  TRIG 44  CHOLHDL 2.7   No results for input(s): TSH, T4TOTAL, T3FREE, THYROIDAB in the last 72 hours.  Invalid input(s): FREET3  Recent Labs  09/11/15 1908  VITAMINB12 409  FOLATE 16.2  FERRITIN 865*  TIBC 235*  IRON 135  RETICCTPCT 1.3   No results for input(s): LIPASE, AMYLASE in the last 72 hours.  Urine Studies No results for input(s): UHGB, CRYS in the last 72 hours.  Invalid input(s): UACOL, UAPR, USPG, UPH, UTP, UGL, UKET, UBIL, UNIT, UROB, ULEU, UEPI, UWBC, URBC, UBAC, CAST, UCOM, BILUA  MICROBIOLOGY: Recent Results (from the past 240 hour(s))  MRSA PCR Screening     Status: None   Collection Time: 09/11/15  2:18 PM  Result Value Ref Range Status   MRSA by PCR NEGATIVE NEGATIVE Final    Comment:        The GeneXpert MRSA Assay (FDA approved for NASAL specimens only), is one component of a comprehensive MRSA colonization surveillance program. It is not intended to diagnose MRSA infection nor to guide or monitor treatment for MRSA infections.     RADIOLOGY STUDIES/RESULTS: Ct Head Wo Contrast  09/10/2015  ADDENDUM REPORT: 09/10/2015 20:50 ADDENDUM: The PA taking care the patient returned my call. Results  were given verbally on 09/10/2015 at 8:49 pm to [facility] , who verbally acknowledged these results. Electronically Signed   By: Rolm Baptise M.D.   On: 09/10/2015 20:50  09/10/2015  CLINICAL DATA:  Fall last night, head injury. EXAM: CT HEAD WITHOUT CONTRAST CT CERVICAL SPINE WITHOUT CONTRAST TECHNIQUE: Multidetector CT imaging of the head and cervical spine was performed following the standard protocol without intravenous contrast. Multiplanar CT image reconstructions of the cervical spine were also generated. COMPARISON:  None. FINDINGS: CT HEAD FINDINGS There is an area of hemorrhage within the right frontal lobe measuring up to 2.5 cm. There is extensive subcortical and deep white matter hypodensity throughout the right cerebral hemisphere, which appears to be out of proportion to the bleed.  Also, some areas of hypodensity are remote to the bleed. I cannot exclude underlying mass lesions within the right cerebral hemisphere. No significant midline shift. No hydrocephalus. No acute calvarial abnormality or scalp swelling. Visualized paranasal sinuses and mastoids clear. Orbital soft tissues unremarkable. CT CERVICAL SPINE FINDINGS Alignment is normal. Prevertebral soft tissues are normal. Early degenerative spurring and disc space narrowing. Mild degenerative facet disease diffusely. No fracture. No epidural or paraspinal hematoma. No acute spreads that IMPRESSION: 2.5 cm hemorrhage within the right frontal lobe with extensive white matter hypodensity throughout the right cerebral hemisphere. While this could be posttraumatic, given the degree of hypodensity throughout the right cerebral hemisphere, I cannot exclude underlying brain lesions. Recommend further evaluation with MRI with and without contrast. No acute bony abnormality in the cervical spine. Attempted to call the Dr. Lillette Boxer care of this patient. She instructed me that she would have to call me back shortly. Electronically Signed: By: Rolm Baptise M.D. On: 09/10/2015 20:28   Ct Chest W Contrast  09/11/2015  CLINICAL DATA:  Carcinoid.  Intracranial hemorrhage. EXAM: CT CHEST WITH CONTRAST TECHNIQUE: Multidetector CT imaging of the chest was performed during intravenous contrast administration. CONTRAST:  67mL OMNIPAQUE IOHEXOL 300 MG/ML  SOLN COMPARISON:  None. FINDINGS: THORACIC INLET/BODY WALL: Partly visible volume loss in the submandibular right neck which appears postsurgical. Bilateral thyroid nodules, up to 15 mm on the right, likely incidental. No adenopathy. MEDIASTINUM: Normal heart size. Small low-density pericardial effusion. No acute vascular abnormality. No adenopathy. LUNG WINDOWS: Mild dependent atelectasis. There is no edema, consolidation, effusion, or pneumothorax. No suspicious pulmonary nodule. UPPER ABDOMEN: Partly visible central hepatic cyst. Patchy heterogeneous enhancement of the spleen is likely physiologic (in the abdomen this is arterial phase) OSSEOUS: No acute fracture.  No suspicious lytic or blastic lesions. IMPRESSION: No primary malignancy or metastatic disease identified in the chest. Electronically Signed   By: Monte Fantasia M.D.   On: 09/11/2015 01:14   Ct Cervical Spine Wo Contrast  09/10/2015  ADDENDUM REPORT: 09/10/2015 20:50 ADDENDUM: The PA taking care the patient returned my call. Results were given verbally on 09/10/2015 at 8:49 pm to [facility] , who verbally acknowledged these results. Electronically Signed   By: Rolm Baptise M.D.   On: 09/10/2015 20:50  09/10/2015  CLINICAL DATA:  Fall last night, head injury. EXAM: CT HEAD WITHOUT CONTRAST CT CERVICAL SPINE WITHOUT CONTRAST TECHNIQUE: Multidetector CT imaging of the head and cervical spine was performed following the standard protocol without intravenous contrast. Multiplanar CT image reconstructions of the cervical spine were also generated. COMPARISON:  None. FINDINGS: CT HEAD FINDINGS There is an area of hemorrhage within the right frontal  lobe measuring up to 2.5 cm. There is extensive subcortical and deep white matter hypodensity throughout the right cerebral hemisphere, which appears to be out of proportion to the bleed. Also, some areas of hypodensity are remote to the bleed. I cannot exclude underlying mass lesions within the right cerebral hemisphere. No significant midline shift. No hydrocephalus. No acute calvarial abnormality or scalp swelling. Visualized paranasal sinuses and mastoids clear. Orbital soft tissues unremarkable. CT CERVICAL SPINE FINDINGS Alignment is normal. Prevertebral soft tissues are normal. Early degenerative spurring and disc space narrowing. Mild degenerative facet disease diffusely. No fracture. No epidural or paraspinal hematoma. No acute spreads that IMPRESSION: 2.5 cm hemorrhage within the right frontal lobe with extensive white matter hypodensity throughout the right cerebral hemisphere. While this could be posttraumatic, given the degree of hypodensity throughout the  right cerebral hemisphere, I cannot exclude underlying brain lesions. Recommend further evaluation with MRI with and without contrast. No acute bony abnormality in the cervical spine. Attempted to call the Dr. Lillette Boxer care of this patient. She instructed me that she would have to call me back shortly. Electronically Signed: By: Rolm Baptise M.D. On: 09/10/2015 20:28   Mr Angiogram Neck W Wo Contrast  09/12/2015  CLINICAL DATA:  Acute onset headache, memory difficulties, 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 History of cancer. EXAM: MRA NECK WITHOUT AND WITH CONTRAST MRA HEAD WITHOUT CONTRAST TECHNIQUE: Multiplanar and multiecho pulse sequences of the neck were obtained without and with intravenous contrast. Angiographic images of the neck were obtained using MRA technique without and with intravenous contrast.; Angiographic images of the Circle of Willis were obtained using MRA technique without intravenous contrast. CONTRAST:  85mL  MULTIHANCE GADOBENATE DIMEGLUMINE 529 MG/ML IV SOLN COMPARISON:  MRI of the brain September 10, 2015 FINDINGS: MRA HEAD FINDINGS Mildly motion degraded examination. Anterior circulation: Flow related enhancement within the cervical, petrous, cavernous and supraclinoid internal carotid arteries, with focally ectatic bilateral carotid terminus, at posterior communicating artery origin. The anterior and middle cerebral arteries are patent. Mild luminal regularity of the middle cerebral arteries. No large vessel occlusion, high-grade stenosis. Posterior circulation: RIGHT vertebral artery is dominant. Basilar artery is patent, with normal flow related enhancement of the main branch vessels. Flow related enhancement of the posterior cerebral arteries with mild luminal regularity. No large vessel occlusion, high-grade stenosis, aneurysm. Evolving RIGHT frontal lobe hematoma with marginal T1 shortening compatible with subacute blood products. Small amount of subacute RIGHT cerebrum subarachnoid hemorrhage partially imaged. MRA NECK FINDINGS Source images and MIP image were reviewed. The common carotid arteries are widely patent bilaterally. The carotid bifurcation is patent bilaterally and there is no significant carotid stenosis. Both vertebral arteries and patent to the basilar without significant vertebral stenosis. There is no evidence for atherosclerosis or flow limiting stenosis. IMPRESSION: MRA HEAD: Focally ectatic bilateral carotid termini, in part related to posterior communicating artery infundibulum. No emergent large vessel occlusion or acute vascular process. Mild luminal irregularity of cerebral arteries compatible with atherosclerosis. MRA NECK:  Negative. Electronically Signed   By: Elon Alas M.D.   On: 09/12/2015 02:33   Mr Jeri Cos [id number] Contrast  09/10/2015  CLINICAL DATA:  [place] yesterday, striking head on dresser. Progressive weakness. Follow-up intracranial hemorrhage. History of carcinoid tumor.  EXAM: MRI HEAD WITHOUT AND WITH CONTRAST TECHNIQUE: Multiplanar, multiecho pulse sequences of the brain and surrounding structures were obtained without and with intravenous contrast. CONTRAST:  95mL MULTIHANCE GADOBENATE DIMEGLUMINE 529 MG/ML IV SOLN COMPARISON:  CT head September 10, 2015 FINDINGS: RIGHT frontal intraparenchymal hemorrhage with peripheral T1 shortening and extensive surrounding T2 bright edema. Superimposed smooth rind of peripheral enhancement, in total measuring 5.3 x 3.3 cm. RIGHT frontal local mass effect, resulting in 2 mm RIGHT to LEFT midline shift. Mass effect on RIGHT frontal horn of lateral ventricle without hydrocephalus or entrapment. Susceptibility artifact within the RIGHT frontoparietal subarachnoid spaces. At least 2 subcentimeter foci of reduced diffusion LEFT cerebellum, in addition to subcentimeter focus of reduced diffusion bilateral occipital lobes, bilateral parietal lobes and, LEFT frontal lobe. 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 RIGHT parietal occipital lobe encephalomalacia with intrinsic T1 shortening consistent with laminar necrosis. Patchy to confluent additional supratentorial white matter FLAIR T2 hyperintensities compatible with subarachnoid hemorrhage. 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 Major intracranial vascular flow voids present at skull base.  Ocular globes and orbital contents are unremarkable. Fluid-filled expanded empty sella. No cerebellar tonsillar ectopia. No suspicious calvarial bone marrow signal. IMPRESSION: Subacute RIGHT frontal lobe 5.3 x 3.3 cm hemorrhage, with disproportionate marginal enhancement and mass effect, concerning for underlying mass though not distinctly demonstrated. Recommend 10-14 day follow-up MRI of the brain with contrast. RIGHT frontoparietal subarachnoid hemorrhage. Multiple subcentimeter foci of acute ischemia (less likely nonenhancing metastasis) within the supra and infratentorial brain, predominately in posterior  circulation favoring embolic phenomena. 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 Electronically Signed   By: Elon Alas M.D.   On: 09/10/2015 22:53   Mr Jodene Nam Head/brain Wo Cm  09/12/2015  CLINICAL DATA:  Acute onset headache, memory difficulties, 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 History of cancer. EXAM: MRA NECK WITHOUT AND WITH CONTRAST MRA HEAD WITHOUT CONTRAST TECHNIQUE: Multiplanar and multiecho pulse sequences of the neck were obtained without and with intravenous contrast. Angiographic images of the neck were obtained using MRA technique without and with intravenous contrast.; Angiographic images of the Circle of Willis were obtained using MRA technique without intravenous contrast. CONTRAST:  56mL MULTIHANCE GADOBENATE DIMEGLUMINE 529 MG/ML IV SOLN COMPARISON:  MRI of the brain September 10, 2015 FINDINGS: MRA HEAD FINDINGS Mildly motion degraded examination. Anterior circulation: Flow related enhancement within the cervical, petrous, cavernous and supraclinoid internal carotid arteries, with focally ectatic bilateral carotid terminus, at posterior communicating artery origin. The anterior and middle cerebral arteries are patent. Mild luminal regularity of the middle cerebral arteries. No large vessel occlusion, high-grade stenosis. Posterior circulation: RIGHT vertebral artery is dominant. Basilar artery is patent, with normal flow related enhancement of the main branch vessels. Flow related enhancement of the posterior cerebral arteries with mild luminal regularity. No large vessel occlusion, high-grade stenosis, aneurysm. Evolving RIGHT frontal lobe hematoma with marginal T1 shortening compatible with subacute blood products. Small amount of subacute RIGHT cerebrum subarachnoid hemorrhage partially imaged. MRA NECK FINDINGS Source images and MIP image were  reviewed. The common carotid arteries are widely patent bilaterally. The carotid bifurcation is patent bilaterally and there is no significant carotid stenosis. Both vertebral arteries and patent to the basilar without significant vertebral stenosis. There is no evidence for atherosclerosis or flow limiting stenosis. IMPRESSION: MRA HEAD: Focally ectatic bilateral carotid termini, in part related to posterior communicating artery infundibulum. No emergent large vessel occlusion or acute vascular process. Mild luminal irregularity of cerebral arteries compatible with atherosclerosis. MRA NECK:  Negative. Electronically Signed   By: Elon Alas M.D.   On: 09/12/2015 02:33    Oren Binet, MD  [facility] Pager:[phone]  If 7PM-7AM, please contact night-coverage [email or web address] Password [email or web address] 09/12/2015, 10:08 AM   LOS: 2 days

## 2015-09-12 NOTE — Evaluation (Signed)
Physical Therapy Evaluation Patient Details Name: Andrea Keller MRN: [id number] DOB: 1946-01-05 Today's Date: 09/12/2015   History of Present Illness  Pt is a 70 y/o F who presented w/ one week of headache, difficulty w/ memory, trouble concentreating.  She fell, which prompted her to come to the ED.  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  Plan is to repeat MRI in 2-4 weeks after ICH resolution to rule out metastasis to the brain. Pt's PMH inlcudes diverticulosis, insomnia, PVD, goblet cell carcinoid.    Clinical Impression  Pt admitted with above diagnosis. Pt currently with functional limitations due to the deficits listed below (see PT Problem List). Mrs. Inzunza presents w/ impaired cognition and poor motor planning.  She requires max verbal and tactile cues as well as min assist to steady while ambulating due to Lt neglect. She will have 24/7 assist available from her husband at d/c.  Pt will benefit from skilled PT to increase their independence and safety with mobility to allow discharge to the venue listed below.      Follow Up Recommendations Home health PT;Supervision/Assistance - 24 hour    Equipment Recommendations  None recommended by PT    Recommendations for Other Services OT consult;Speech consult (Speech-cognitive eval)     Precautions / Restrictions Precautions Precautions: Fall Restrictions Weight Bearing Restrictions: No      Mobility  Bed Mobility Overal bed mobility: Modified Independent             General bed mobility comments: Increased time  Transfers Overall transfer level: Needs assistance Equipment used: None Transfers: Sit to/from Stand Sit to Stand: Min assist         General transfer comment: Slow to stand and requires min assist to steady due to LOB standing from commode  Ambulation/Gait Ambulation/Gait assistance: Min  assist Ambulation Distance (Feet): 300 Feet Assistive device: None Gait Pattern/deviations: Decreased stride length;Drifts right/left   Gait velocity interpretation: <1.8 ft/sec, indicative of risk for recurrent falls General Gait Details: Very decreased gait speed and pt appears confused.  Pt walks on Lt side of the hallway and requires verbal and tactile cues to avoid bumping into objects on the Lt.  Min assist at times to steady.    Stairs Stairs: Yes Stairs assistance: Min assist Stair Management: One rail Right;Step to pattern;Forwards Number of Stairs: 4 General stair comments: Pt requires max verbal cues and tactile cues and took a few minutes to grasp task of descending the steps.  Min assist to steady.  Wheelchair Mobility    Modified Rankin (Stroke Patients Only)       Balance Overall balance assessment: Needs assistance;History of Falls Sitting-balance support: No upper extremity supported;Feet supported Sitting balance-Leahy Scale: Good     Standing balance support: No upper extremity supported;During functional activity Standing balance-Leahy Scale: Fair                               Pertinent Vitals/Pain Pain Assessment: No/denies pain    Home Living Family/patient expects to be discharged to:: Private residence Living Arrangements: Spouse/significant other;Children Available Help at Discharge: Family;Available 24 hours/day (husband) Type of Home: House Home Access: Stairs to enter Entrance Stairs-Rails: None Entrance Stairs-Number of Steps: 4 Home Layout: One level Home Equipment: Walker - 2 wheels;Bedside commode      Prior Function Level of Independence: Independent  Comments: still driving, Ind w/ all mobility and ADLs     Hand Dominance   Dominant Hand: Right    Extremity/Trunk Assessment   Upper Extremity Assessment: Defer to OT evaluation           Lower Extremity Assessment: Difficult to assess due to  impaired cognition (due to poor motor planning; functional w/ ambulation)      Cervical / Trunk Assessment: Kyphotic  Communication   Communication: No difficulties  Cognition Arousal/Alertness: Awake/alert Behavior During Therapy: WFL for tasks assessed/performed Overall Cognitive Status: Impaired/Different from baseline Area of Impairment: Orientation;Attention;Memory;Safety/judgement;Following commands;Awareness;Problem solving Orientation Level: Disoriented to;Time Current Attention Level: Sustained Memory: Decreased short-term memory Following Commands: Follows one step commands with increased time Safety/Judgement: Decreased awareness of deficits Awareness: Intellectual Problem Solving: Slow processing;Decreased initiation;Difficulty sequencing;Requires verbal cues;Requires tactile cues General Comments: Says it is January and is Tuesday.  Pt requires max verbal and tactile cues for simple tasks such as walking down the steps.  Pt presents w/ ideomotor apraxia and poor motor planning.  Reads clock w/ hands incorrectly and begins guessing what time it is.  Needs cues for sequencing w/ toileting and washing hands    General Comments General comments (skin integrity, edema, etc.): Lt neglect vs. visual field impairment.  Pt unable to follow commands for eye tracking in all quadrants.  When asked to follow therpists finger she begins mimicing therpist's movements.  Pt bumping into objects on her Lt and requires verbal cues to find objects on her left.    Exercises        Assessment/Plan    PT Assessment Patient needs continued PT services  PT Diagnosis Difficulty walking;Altered mental status   PT Problem List Decreased strength;Decreased activity tolerance;Decreased balance;Decreased mobility;Decreased cognition;Decreased safety awareness  PT Treatment Interventions DME instruction;Gait training;Stair training;Functional mobility training;Therapeutic activities;Therapeutic  exercise;Balance training;Neuromuscular re-education;Cognitive remediation;Patient/family education   PT Goals (Current goals can be found in the Care Plan section) Acute Rehab PT Goals Patient Stated Goal: to get back to cooking, cleaning, everyday tasks PT Goal Formulation: With patient/family Time For Goal Achievement: 09/26/15 Potential to Achieve Goals: Good    Frequency Min 3X/week   Barriers to discharge Inaccessible home environment steps to enter home    Co-evaluation               End of Session Equipment Utilized During Treatment: Gait belt Activity Tolerance: Patient tolerated treatment well Patient left: in chair;Other (comment);with call bell/phone within reach (w/ daughter washing up in bathroom) Nurse Communication: Mobility status;Other (comment) (cognitive status)         Time: [id number] PT Time Calculation (min) (ACUTE ONLY): 41 min   Charges:   PT Evaluation $PT Eval Moderate Complexity: 1 Procedure PT Treatments $Gait Training: 8-22 mins $Therapeutic Activity: 8-22 mins   PT G Codes:       Collie Siad PT, DPT  Pager: [phone] Phone: [phone] 09/12/2015, 10:13 AM

## 2015-09-12 NOTE — Procedures (Signed)
ELECTROENCEPHALOGRAM REPORT  Date of Study: 09/12/2015  Patient's Name: Andrea Keller MRN: [id number] Date of Birth: Aug 01, 1945  Referring Provider: Oren Binet, MD  Indication: 70 year old woman with right hemispheric ICH versus malignancy who has intermittent confusion  Medications: acetaminophen (TYLENOL) tablet 650 mg dexamethasone (DECADRON) injection 10 mg ferrous sulfate tablet 325 mg ondansetron (ZOFRAN) injection 4 mg oxyCODONE-acetaminophen (PERCOCET/ROXICET) 5-325 MG per tablet 1-2 tablet pantoprazole (PROTONIX) EC tablet 40 mg senna-docusate (Senokot-S) tablet 1 tablet zolpidem (AMBIEN) tablet 5 mg  Technical Summary: This is a multichannel digital EEG recording, using the international 10-20 placement system with electrodes applied with paste and impedances below 5000 ohms.    Description: The EEG background is symmetric, with a well-developed posterior dominant rhythm of 8 Hz, which is reactive to eye opening and closing.  Diffuse beta activity is seen, with a bilateral frontal preponderance.  No focal or generalized abnormalities are seen.  No focal or generalized epileptiform discharges are seen.  Stage II sleep is not seen  Hyperventilation and photic stimulation were not performed.  ECG revealed normal cardiac rate and rhythm.  Impression: This is a normal routine EEG of the awake and drowsy states, without activating procedures.  A normal study does not rule out the possibility of a seizure disorder in this patient.  Adam R. Tomi Likens, DO

## 2015-09-13 ENCOUNTER — Other Ambulatory Visit: Payer: Self-pay | Admitting: Neurology

## 2015-09-13 ENCOUNTER — Inpatient Hospital Stay (HOSPITAL_COMMUNITY): Payer: Medicare Other

## 2015-09-13 DIAGNOSIS — D696 Thrombocytopenia, unspecified: Secondary | ICD-10-CM

## 2015-09-13 DIAGNOSIS — R7989 Other specified abnormal findings of blood chemistry: Secondary | ICD-10-CM

## 2015-09-13 DIAGNOSIS — C801 Malignant (primary) neoplasm, unspecified: Secondary | ICD-10-CM

## 2015-09-13 DIAGNOSIS — D62 Acute posthemorrhagic anemia: Secondary | ICD-10-CM

## 2015-09-13 DIAGNOSIS — R778 Other specified abnormalities of plasma proteins: Secondary | ICD-10-CM | POA: Insufficient documentation

## 2015-09-13 DIAGNOSIS — I6789 Other cerebrovascular disease: Secondary | ICD-10-CM

## 2015-09-13 DIAGNOSIS — I619 Nontraumatic intracerebral hemorrhage, unspecified: Secondary | ICD-10-CM

## 2015-09-13 DIAGNOSIS — I10 Essential (primary) hypertension: Secondary | ICD-10-CM

## 2015-09-13 LAB — TROPONIN I
TROPONIN I: 0.08 ng/mL — AB (ref <0.031)
TROPONIN I: 0.1 ng/mL — AB (ref <0.031)
TROPONIN I: 0.11 ng/mL — AB (ref <0.031)
TROPONIN I: 0.14 ng/mL — AB (ref <0.031)

## 2015-09-13 LAB — ECHOCARDIOGRAM COMPLETE
HEIGHTINCHES: 63 in
Weight: 2264.57 oz

## 2015-09-13 MED ORDER — POLYETHYLENE GLYCOL 3350 17 G PO PACK
17.0000 g | PACK | Freq: Every day | ORAL | Status: DC
  Administered 2015-09-13: 17 g via ORAL
  Filled 2015-09-13 (×2): qty 1

## 2015-09-13 MED ORDER — DEXAMETHASONE 4 MG PO TABS
6.0000 mg | ORAL_TABLET | Freq: Three times a day (TID) | ORAL | Status: DC
  Administered 2015-09-13 – 2015-09-14 (×3): 6 mg via ORAL
  Filled 2015-09-13 (×3): qty 1

## 2015-09-13 NOTE — Progress Notes (Signed)
Physical Therapy Treatment Patient Details Name: Andrea Keller MRN: [id number] DOB: 12/08/1945 Today's Date: 09/13/2015    History of Present Illness Pt is a 70 y/o F who presented w/ one week of headache, difficulty w/ memory, trouble concentreating.  She fell, which prompted her to come to the ED.  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  Plan is to repeat MRI in 2-4 weeks after ICH resolution to rule out metastasis to the brain. Pt's PMH inlcudes diverticulosis, insomnia, PVD, goblet cell carcinoid.      PT Comments    Pt progressing towards goals however pt remains at significant falls risk as demo'd by score of 12 on DGI and severe cognitive impairments. Pt with severe L sided neglect, decreased insight to deficits, delayed processing, impaired comprehension and impaired sequencing. Pt assisted to bathroom and required constant v/c's to sequence pulling down underwear and transfering onto commode. Pt indep PTA and would be an excellent candidate for CIR upon d/c for maximal functional and cognitive recovery.  Follow Up Recommendations  CIR     Equipment Recommendations  None recommended by PT    Recommendations for Other Services       Precautions / Restrictions Precautions Precautions: Fall Restrictions Weight Bearing Restrictions: No    Mobility  Bed Mobility Overal bed mobility: Needs Assistance Bed Mobility: Supine to Sit     Supine to sit: Min assist     General bed mobility comments: tactile cues and minA to scoot hips to EOB  Transfers Overall transfer level: Needs assistance Equipment used: None;1 person hand held assist Transfers: Sit to/from Stand Sit to Stand: Min assist         General transfer comment: slow to stand, v/c's for technique  Ambulation/Gait Ambulation/Gait assistance: Min assist Ambulation Distance (Feet): 300  Feet Assistive device: None Gait Pattern/deviations: Decreased step length - right;Shuffle;Narrow base of support   Gait velocity interpretation: <1.8 ft/sec, indicative of risk for recurrent falls General Gait Details: extremely slow, guarded, trunk flexed holding hands near body. no episodes of LOB however pt unable to navigate around obstacles on the L, find her way back to her room, process multi-step commands to complete the DGI. pt unable to problem solve on how to navigate around obstacle once she ran into it   Financial trader Rankin (Stroke Patients Only)       Balance                                    Cognition Arousal/Alertness: Awake/alert Behavior During Therapy: WFL for tasks assessed/performed Overall Cognitive Status: Impaired/Different from baseline Area of Impairment: Safety/judgement;Awareness;Problem solving;Memory;Attention   Current Attention Level: Selective Memory: Decreased short-term memory;Decreased recall of precautions Following Commands: Follows multi-step commands inconsistently;Follows one step commands with increased time;Follows one step commands inconsistently Safety/Judgement: Decreased awareness of safety;Decreased awareness of deficits (L sided neglect) Awareness: Emergent Problem Solving: Slow processing;Decreased initiation;Difficulty sequencing;Requires verbal cues;Requires tactile cues General Comments: pt reports she's at . pt with significant L sided weakness and inability to find room, passed it 5 times requiring max verbal prompting to problem solve    Exercises      General Comments General comments (skin integrity, edema, etc.): pt with strong Lt neglect  Pertinent Vitals/Pain Pain Assessment: No/denies pain    Home Living     Available Help at Discharge: Family;Available 24 hours/day Type of Home: House              Prior Function             PT Goals (current goals can now be found in the care plan section) Progress towards PT goals: Progressing toward goals    Frequency  Min 4X/week    PT Plan Discharge plan needs to be updated    Co-evaluation             End of Session Equipment Utilized During Treatment: Gait belt Activity Tolerance: Patient tolerated treatment well Patient left:  (on commode with daughter)     Time: [id number] PT Time Calculation (min) (ACUTE ONLY): 24 min  Charges:  $Gait Training: 8-22 mins $Neuromuscular Re-education: 8-22 mins                    G Codes:      Kingsley Callander 09/13/2015, 4:03 PM   Kittie Plater, PT, DPT Pager #: [phone] Office #: [phone]

## 2015-09-13 NOTE — Evaluation (Signed)
Speech Language Pathology Evaluation Patient Details Name: Andrea Keller MRN: [id number] DOB: 04-05-1946 Today's Date: 09/13/2015 Time: 1300-1330 SLP Time Calculation (min) (ACUTE ONLY): 30 min  Problem List:  Patient Active Problem List   Diagnosis Date Noted  . Essential hypertension   . HLD (hyperlipidemia)   . Malignant neoplasm of colon ([place])   . 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 Intraparenchymal hemorrhage of brain ([place]) 09/10/2015  . Microcytosis   . PVD (peripheral vascular disease) ([place])   . Hypertension   . Goblet cell carcinoid ([place]) 11/08/2014  . Thrombocytopenia ([place]) 11/08/2014  . Acute appendicitis 10/28/2014  . Perforated appendicitis 10/28/2014   Past Medical History:  Past Medical History  Diagnosis Date  . Diverticulosis   . Hyperplastic colon polyp   . Insomnia   . Microcytosis   . Osteoarthritis   . PVD (peripheral vascular disease) ([place])   . Multiple thyroid nodules   . Hypertension   . Goblet cell carcinoid ([place]) 11/08/2014   Past Surgical History:  Past Surgical History  Procedure Laterality Date  . Cyst removal neck    . Abdominal hysterectomy  2009  . Laparoscopic appendectomy N/A 10/28/2014    Procedure: APPENDECTOMY LAPAROSCOPIC;  Surgeon: Erroll Luna, MD;  Location: [facility];  Service: General;  Laterality: N/A;  . Appendectomy  10/28/2014   HPI:  70 year old female admitted 09/10/15 after a fall at home.    Assessment / Plan / Recommendation Clinical Impression  The Mini Mental State Exam was administered. Pt scored 18/30 (n=>26/30). Difficulty noted on orientation, attention, and complex direction following. During reading and writing task, pt was noted to exhibit left inattention. Given significance of cognitive deficits and level of independence prior to admit, CIR is recommended over DC home with home health.    SLP Assessment  All further Speech Lanaguage Pathology  needs can be addressed in the next venue of care    Follow Up  Recommendations  Inpatient Rehab    Frequency and Duration   recommend follow up at next venue        SLP Evaluation Prior Functioning  Cognitive/Linguistic Baseline: Within functional limits Type of Home: House  Lives With: Spouse;Son Available Help at Discharge: Family;Available 24 hours/day Education: 11th grade Vocation: Retired   Associate Professor  Overall Cognitive Status: Impaired/Different from baseline Arousal/Alertness: Awake/alert Orientation Level: Oriented to person;Oriented to place;Oriented to situation (difficulty with day/date)    Comprehension  Auditory Comprehension Commands: Impaired Complex Commands: 75-100% accurate Reading Comprehension Reading Status: Impaired Sentence Level: Impaired (left inattention) Interfering Components: Attention;Visual scanning;Left neglect/inattention;Working Engineer, technical sales: Presenter, broadcasting Expression Overall Verbal Expression: Appears within functional limits for tasks assessed Written Expression Dominant Hand: Right   Oral / Motor  Oral Motor/Sensory Function Overall Oral Motor/Sensory Function: Within functional limits Motor Speech Overall Motor Speech: Appears within functional limits for tasks assessed   GO                    Shonna Chock 09/13/2015, 1:32 PM  Jamelle Goldston [facility], [place] [phone]

## 2015-09-13 NOTE — Progress Notes (Signed)
[facility] PROGRESS NOTE  RADWA FELLINGER [id number] DOB: [birth year] DOA: 09/10/2015 PCP: Philis Fendt, MD  Subjective: Patient is awake and alert in bed. Pleasant and conversational. No headache.   Assessment/Plan: Principal Problem:  Right frontal ICH: Non-focal exam except for some right facial droop. 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 MRI Neck no significant stenosis. Echo shows no embolic source. Neuro suggests a repeat MRI w/contrast in 2-4 weeks post-ICH resolution to rule out brain mets. Dr. Erlinda Hong recommends a 30 day cardiac monitor. Continue Decadron-but change to oral route. EEG WNL.  Active Problems: Intermittent Confusion: Daughter states that she is better today than yesterday - AxOx3. EEG WNL.  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 Followed at [facility]. CT Chest 09/11/2015 negative for mets/malignancy. Most recent CT Abd 08/26/2015 at [facility] negative for malignancy as well (reviwed report in care everywhere). Patient has microcytic anemia-but colonoscopy on 2015 was negative. [place] curbsided Dr Carlean Purl on 09/11/2015-he does not think a repeat colonoscopy is warranted at this time. 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 Sabas Sous as an outpatient. Better characterized on CTA head on a nonemergent basis.  Minimally elevated troponins:  Diffuse T wave inversions are likely due to [place]. Troponin trend is flat and not consistent with ACS. Given ICH-unable to use anti-platelets or pursue further work up. Echo pending.  AKI: Resolved.  Microcytic Anemia: Iron panel suggestive  of anemia of chronic disease.Follow            Code Status: Full Family Communication: Daughter at bedside (Cell [id number]) Disposition Plan: Remain inpatient-CIR on discharge DVT Prophylaxis: SCD's   Consultants:  Neurology  Neurosurgery  Antimicrobial agents  None  Procedures:  None      Objective: Filed Vitals:   09/13/15 0522 09/13/15 1003  BP: 154/60 138/67  Pulse: 57 54  Temp: 97.9 F (36.6 C) 97.7 F (36.5 C)  Resp: 18 16    Intake/Output Summary (Last 24 hours) at 09/13/15 1121 Last data filed at 09/12/15 2230  Gross per 24 hour  Intake    720 ml  Output      0 ml  Net    720 ml   Filed Weights   09/10/15 1617 09/12/15 2130  Weight: 55.792 kg (123 lb) 64.2 kg (141 lb 8.6 oz)    Exam:   General:  Alert, oriented x 3. Clear speech. Pleasant and conversational.  Cardiovascular: S1, S2 normal. No M/R/C/G. No edema.  Respiratory: CTABL. No adventitious breath sounds.  Abdomen: Soft, NTND. +BS  Musculoskeletal: Strength 5/5 throughout.   Neuro: Appears to have a significant visual deficit in the left eye. Right-sided facial droop, but can wrinkle forehead symetrically. DTRs 3+ throughout.  Follows commands appropriately.  Data Reviewed: Basic Metabolic Panel:  Recent Labs Lab 09/10/15 2022 09/11/15 0827  NA 140 140  K 4.8 4.5  CL 103 106  CO2 26 24  GLUCOSE 113* 127*  BUN 15 11  CREATININE 1.03* 0.93  CALCIUM 9.4 9.3   Liver Function Tests:  Recent Labs Lab 09/10/15 2022  AST 21  ALT 15  ALKPHOS 75  BILITOT 0.3  PROT 7.0  ALBUMIN 3.5  No results for input(s): LIPASE, AMYLASE in the last 168 hours. No results for input(s): AMMONIA in the last 168 hours. CBC:  Recent Labs Lab 09/10/15 2022 09/12/15 0706  WBC 6.0 7.8  NEUTROABS 2.7  --   HGB 10.7* 10.7*  HCT 33.1* 34.1*  MCV 74.5* 73.7*  PLT 154 142*   Cardiac Enzymes:  Recent Labs Lab 09/12/15 0706 09/12/15 1223 09/12/15 1759 09/13/15 0022  09/13/15 0911  TROPONINI 0.09* 0.10* 0.09* 0.08* 0.10*   BNP (last 3 results) No results for input(s): BNP in the last 8760 hours.  ProBNP (last 3 results) No results for input(s): PROBNP in the last 8760 hours.  CBG: No results for input(s): GLUCAP in the last 168 hours.  Recent Results (from the past 240 hour(s))  MRSA PCR Screening     Status: None   Collection Time: 09/11/15  2:18 PM  Result Value Ref Range Status   MRSA by PCR NEGATIVE NEGATIVE Final    Comment:        The GeneXpert MRSA Assay (FDA approved for NASAL specimens only), is one component of a comprehensive MRSA colonization surveillance program. It is not intended to diagnose MRSA infection nor to guide or monitor treatment for MRSA infections.      Studies: Mr Angiogram Neck W Wo Contrast  09/12/2015  CLINICAL DATA:  Acute onset headache, memory difficulties, 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 History of cancer. EXAM: MRA NECK WITHOUT AND WITH CONTRAST MRA HEAD WITHOUT CONTRAST TECHNIQUE: Multiplanar and multiecho pulse sequences of the neck were obtained without and with intravenous contrast. Angiographic images of the neck were obtained using MRA technique without and with intravenous contrast.; Angiographic images of the Circle of Willis were obtained using MRA technique without intravenous contrast. CONTRAST:  28mL MULTIHANCE GADOBENATE DIMEGLUMINE 529 MG/ML IV SOLN COMPARISON:  MRI of the brain September 10, 2015 FINDINGS: MRA HEAD FINDINGS Mildly motion degraded examination. Anterior circulation: Flow related enhancement within the cervical, petrous, cavernous and supraclinoid internal carotid arteries, with focally ectatic bilateral carotid terminus, at posterior communicating artery origin. The anterior and middle cerebral arteries are patent. Mild luminal regularity of the middle cerebral arteries. No large vessel occlusion, high-grade stenosis. Posterior circulation: RIGHT vertebral artery is  dominant. Basilar artery is patent, with normal flow related enhancement of the main branch vessels. Flow related enhancement of the posterior cerebral arteries with mild luminal regularity. No large vessel occlusion, high-grade stenosis, aneurysm. Evolving RIGHT frontal lobe hematoma with marginal T1 shortening compatible with subacute blood products. Small amount of subacute RIGHT cerebrum subarachnoid hemorrhage partially imaged. MRA NECK FINDINGS Source images and MIP image were reviewed. The common carotid arteries are widely patent bilaterally. The carotid bifurcation is patent bilaterally and there is no significant carotid stenosis. Both vertebral arteries and patent to the basilar without significant vertebral stenosis. There is no evidence for atherosclerosis or flow limiting stenosis. IMPRESSION: MRA HEAD: Focally ectatic bilateral carotid termini, in part related to posterior communicating artery infundibulum. No emergent large vessel occlusion or acute vascular process. Mild luminal irregularity of cerebral arteries compatible with atherosclerosis. MRA NECK:  Negative. Electronically Signed   By: Elon Alas M.D.   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 History of cancer. EXAM: MRA NECK WITHOUT AND WITH CONTRAST MRA HEAD WITHOUT CONTRAST TECHNIQUE: Multiplanar and multiecho pulse sequences of the neck were obtained without and  with intravenous contrast. Angiographic images of the neck were obtained using MRA technique without and with intravenous contrast.; Angiographic images of the Circle of Willis were obtained using MRA technique without intravenous contrast. CONTRAST:  84mL MULTIHANCE GADOBENATE DIMEGLUMINE 529 MG/ML IV SOLN COMPARISON:  MRI of the brain September 10, 2015 FINDINGS: MRA HEAD FINDINGS Mildly motion degraded examination. Anterior circulation: Flow  related enhancement within the cervical, petrous, cavernous and supraclinoid internal carotid arteries, with focally ectatic bilateral carotid terminus, at posterior communicating artery origin. The anterior and middle cerebral arteries are patent. Mild luminal regularity of the middle cerebral arteries. No large vessel occlusion, high-grade stenosis. Posterior circulation: RIGHT vertebral artery is dominant. Basilar artery is patent, with normal flow related enhancement of the main branch vessels. Flow related enhancement of the posterior cerebral arteries with mild luminal regularity. No large vessel occlusion, high-grade stenosis, aneurysm. Evolving RIGHT frontal lobe hematoma with marginal T1 shortening compatible with subacute blood products. Small amount of subacute RIGHT cerebrum subarachnoid hemorrhage partially imaged. MRA NECK FINDINGS Source images and MIP image were reviewed. The common carotid arteries are widely patent bilaterally. The carotid bifurcation is patent bilaterally and there is no significant carotid stenosis. Both vertebral arteries and patent to the basilar without significant vertebral stenosis. There is no evidence for atherosclerosis or flow limiting stenosis. IMPRESSION: MRA HEAD: Focally ectatic bilateral carotid termini, in part related to posterior communicating artery infundibulum. No emergent large vessel occlusion or acute vascular process. Mild luminal irregularity of cerebral arteries compatible with atherosclerosis. MRA NECK:  Negative. Electronically Signed   By: Elon Alas M.D.   On: 09/12/2015 02:33    Scheduled Meds: . dexamethasone  10 mg Intravenous 4 times per day  . ferrous sulfate  325 mg Oral Q breakfast  . pantoprazole  40 mg Oral Q1200  . rosuvastatin  10 mg Oral q1800   Continuous Infusions:   Principal Problem:   Intraparenchymal hemorrhage of brain (HCC) Active Problems:   Goblet cell carcinoid (HCC)   Microcytosis   Hypertension    Stroke (cerebrum) (HCC)   Essential hypertension   HLD (hyperlipidemia)   Malignant neoplasm of colon ([place])   Time spent 30 minutes-Greater than 50% of this time was spent in counseling, explanation of diagnosis, planning of further management, and coordination of care.  S Ghimire   [facility]  If 7PM-7AM, please contact night-coverage at [email or web address], password [facility] 09/13/2015, 11:21 AM  LOS: 3 days

## 2015-09-13 NOTE — Progress Notes (Signed)
  Echocardiogram 2D Echocardiogram has been performed.  Bobbye Charleston 09/13/2015, 12:30 PM

## 2015-09-13 NOTE — Progress Notes (Signed)
Rehab Admissions Coordinator Note:  Patient was screened by Cleatrice Burke for appropriateness for an Inpatient Acute Rehab Consult per OT recommendation. PT recommends HH.  At this time, we are recommending clarification with pt and family what rehab venue they prefer, [facility] vs a possible inpt rheab or SNF venue. . Insurance would have to approve any rehab venue. Please order inpt rehab consult if pt wishes to pursue an admission.   Cleatrice Burke 09/13/2015, 7:16 AM  I can be reached at [phone].

## 2015-09-13 NOTE — Consult Note (Addendum)
Physical Medicine and Rehabilitation Consult Reason for Consult: Traumatic ICH Referring Physician: [facility]   HPI: Andrea Keller is a 70 y.o. 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 Patient lives with spouse and assistance is needed. Independent prior to admission still driving. One level home with 4 steps to entry. Presented 09/10/2015 with persistent frontal headaches and generalized weakness as well as difficulty in concentrating. She did have a recent fall 09/09/2015. Her head on a dresser. CT of the head showed a 2.5 cm hemorrhage within the right frontal lobe with extensive white matter hypodensity throughout the right cerebral hemisphere. MRI completed showed right frontal lobe 5.3 x 3.3 cm hemorrhage with disproportionate marginal enhancement and mass effect/right frontoparietal subarachnoid hemorrhage. EEG negative seizure. CT of the chest negative. MRA of head and neck with no emergent large vessel occlusion. Neurology consulted plan repeat MRI in 2-4 weeks. Decadron protocol. Tolerating a regular diet. Occupational therapy evaluation completed with recommendations of physical medicine rehabilitation consult.   Review of Systems  Constitutional: Negative for fever and chills.  HENT: Negative for hearing loss.   Eyes: Negative for blurred vision and double vision.  Respiratory: Negative for cough and shortness of breath.   Cardiovascular: Negative for chest pain, palpitations and leg swelling.  Gastrointestinal: Positive for constipation. Negative for nausea and vomiting.  Genitourinary: Negative for dysuria and hematuria.  Musculoskeletal: Positive for myalgias and falls.  Neurological: Positive for weakness and headaches. Negative for seizures and loss of consciousness.  Psychiatric/Behavioral: The patient has insomnia.   All other systems reviewed and are negative.  Past  Medical History  Diagnosis Date  . Diverticulosis   . Hyperplastic colon polyp   . Insomnia   . Microcytosis   . Osteoarthritis   . PVD (peripheral vascular disease) ([place])   . Multiple thyroid nodules   . Hypertension   . Goblet cell carcinoid ([place]) 11/08/2014   Past Surgical History  Procedure Laterality Date  . Cyst removal neck    . Abdominal hysterectomy  2009  . Laparoscopic appendectomy N/A 10/28/2014    Procedure: APPENDECTOMY LAPAROSCOPIC;  Surgeon: Erroll Luna, MD;  Location: [facility];  Service: General;  Laterality: N/A;  . Appendectomy  10/28/2014   Family History  Problem Relation Age of Onset  . Colon cancer Brother   . Diabetes Mother   . Lung cancer Father   . Alcohol abuse     Social History:  reports that she has quit smoking. She has never used smokeless tobacco. She reports that she does not drink alcohol or use illicit drugs. Allergies:  Allergies  Allergen Reactions  . Latex Rash   Medications Prior to Admission  Medication Sig Dispense Refill  . acetaminophen (TYLENOL) 325 MG tablet Take 650 mg by mouth daily as needed for moderate pain.     Marland Kitchen amLODipine (NORVASC) 10 MG tablet Take 10 mg by mouth daily.    . ferrous sulfate 325 (65 FE) MG tablet Take 325 mg by mouth daily with breakfast.    . rosuvastatin (CRESTOR) 10 MG tablet Take 10 mg by mouth daily.    . metroNIDAZOLE (FLAGYL) 500 MG tablet Take 1 tablet (500 mg total) by mouth 3 (three) times daily. 21 tablet 0  . oxyCODONE-acetaminophen (PERCOCET/ROXICET) 5-325 MG per tablet Take 1-2 tablets by mouth every 4 (four) hours as needed for moderate pain. 30 tablet 0    Home: Home Living Family/patient  expects to be discharged to:: Private residence Living Arrangements: Spouse/significant other, Children Available Help at Discharge: Family, Available 24 hours/day (husband) Type of Home: House Home Access: Stairs to enter Technical brewer of Steps: 4 Entrance Stairs-Rails: None Home Layout:  One level Bathroom Shower/Tub: Gaffer, Door, Architectural technologist: Standard Bathroom Accessibility: Yes Home Equipment: Environmental consultant - 2 wheels  Functional History: Prior Function Level of Independence: Independent Comments: still driving, Ind w/ all mobility and ADLs Functional Status:  Mobility: Bed Mobility Overal bed mobility: Needs Assistance Bed Mobility: Supine to Sit Supine to sit: Min assist General bed mobility comments: unable to figure out how to get out of bed on L side Transfers Overall transfer level: Needs assistance Equipment used: None, 1 person hand held assist Transfers: Sit to/from Stand Sit to Stand: Min assist General transfer comment: Slow to stand and requires min assist to steady due to LOB standing from commode Ambulation/Gait Ambulation/Gait assistance: Min assist Ambulation Distance (Feet): 300 Feet Assistive device: None Gait Pattern/deviations: Decreased stride length, Drifts right/left General Gait Details: Very decreased gait speed and pt appears confused.  Pt walks on Lt side of the hallway and requires verbal and tactile cues to avoid bumping into objects on the Lt.  Min assist at times to steady.   Gait velocity interpretation: <1.8 ft/sec, indicative of risk for recurrent falls Stairs: Yes Stairs assistance: Min assist Stair Management: One rail Right, Step to pattern, Forwards Number of Stairs: 4 General stair comments: Pt requires max verbal cues and tactile cues and took a few minutes to grasp task of descending the steps.  Min assist to steady.    ADL: ADL Overall ADL's : Needs assistance/impaired Eating/Feeding Details (indicate cue type and reason): will further assess Grooming: Minimal assistance Grooming Details (indicate cue type and reason): washed hands at sink and walked away without drying hands.Required mod vc to find paper towel holder Upper Body Bathing: Minimal assitance Lower Body Bathing: Moderate  assistance Upper Body Dressing : Moderate assistance Lower Body Dressing: Moderate assistance Lower Body Dressing Details (indicate cue type and reason): perseerating on removing shoes. continued to try to put shoes back on while given command toremove shoes. Given command tohand therapist belt and pt continued to try to thread belt loop and was unable to terminate actio to hand therapist bilet. Required hand over hand to terminate activity. Functional mobility during ADLs: Minimal assistance General ADL Comments: Pt asked to get out of bed. Rail down on L and pt trying to get out on R side with rail. Pt requried mina to get out of bed on left. Pt unable to reproduce 2 step command of follow functional sequence. Pt asked to walk to nursing station to get cupof water. Pt instead walked over to other side of bed which was on her R side. Pt unable to find her way back to her room when she was standing at the end of the nursing station. when asked to find her room number, she found it in the writing of the "purrell" hand sanitizer outside of her room.   Cognition: Cognition Overall Cognitive Status: Impaired/Different from baseline Orientation Level: Oriented X4 Cognition Arousal/Alertness: Awake/alert Behavior During Therapy: Restless Overall Cognitive Status: Impaired/Different from baseline Area of Impairment: Orientation, Attention, Memory, Safety/judgement, Following commands, Awareness, Problem solving Orientation Level: Disoriented to, Time Current Attention Level: Sustained Memory: Decreased short-term memory Following Commands: Follows one step commands with increased time Safety/Judgement: Decreased awareness of deficits, Decreased awareness of safety Awareness: Intellectual Problem Solving:  Slow processing, Decreased initiation, Difficulty sequencing, Requires verbal cues, Requires tactile cues General Comments: Says it is January and is Tuesday.  Pt requires max verbal and tactile cues  for simple tasks such as walking down the steps.  Pt presents w/ ideomotor apraxia and poor motor planning.  Reads clock w/ hands incorrectly and begins guessing what time it is.  Needs cues for sequencing w/ toileting and washing hands  Blood pressure 154/60, pulse 57, temperature 97.9 F (36.6 C), temperature source Axillary, resp. rate 18, height 5\' 3"  (1.6 m), weight 64.2 kg (141 lb 8.6 oz), SpO2 99 %. Physical Exam  Vitals reviewed. Constitutional: She is oriented to person, place, and time. She appears well-developed.  70 year old frail female   HENT:  Head: Normocephalic.  Right Ear: External ear normal.  Left Ear: External ear normal.  Eyes: Conjunctivae and EOM are normal.  Neck: Normal range of motion. Neck supple. No thyromegaly present.  Cardiovascular: Normal rate and regular rhythm.   Respiratory: Effort normal and breath sounds normal. No respiratory distress.  GI: Soft. Bowel sounds are normal. She exhibits no distension.  Musculoskeletal: She exhibits no edema or tenderness.  Neurological: She is alert and oriented to person, place, and time.  Follows commands Left inattention DTRs symmetric Sensation intact to light touch Motor: RUE/RLE: 4+/5 LUE/LLE: 4/5  Skin: Skin is warm and dry.  Psychiatric: She has a normal mood and affect. Her behavior is normal.    Results for orders placed or performed during the hospital encounter of 09/10/15 (from the past 24 hour(s))  Troponin I     Status: Abnormal   Collection Time: 09/12/15 12:23 PM  Result Value Ref Range   Troponin I 0.10 (H) <0.031 ng/mL  Troponin I     Status: Abnormal   Collection Time: 09/12/15  5:59 PM  Result Value Ref Range   Troponin I 0.09 (H) <0.031 ng/mL  Troponin I     Status: Abnormal   Collection Time: 09/13/15 12:22 AM  Result Value Ref Range   Troponin I 0.08 (H) <0.031 ng/mL   Mr Angiogram Neck W Wo Contrast  09/12/2015  CLINICAL DATA:  Acute onset headache, memory difficulties,  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 History of cancer. EXAM: MRA NECK WITHOUT AND WITH CONTRAST MRA HEAD WITHOUT CONTRAST TECHNIQUE: Multiplanar and multiecho pulse sequences of the neck were obtained without and with intravenous contrast. Angiographic images of the neck were obtained using MRA technique without and with intravenous contrast.; Angiographic images of the Circle of Willis were obtained using MRA technique without intravenous contrast. CONTRAST:  53mL MULTIHANCE GADOBENATE DIMEGLUMINE 529 MG/ML IV SOLN COMPARISON:  MRI of the brain September 10, 2015 FINDINGS: MRA HEAD FINDINGS Mildly motion degraded examination. Anterior circulation: Flow related enhancement within the cervical, petrous, cavernous and supraclinoid internal carotid arteries, with focally ectatic bilateral carotid terminus, at posterior communicating artery origin. The anterior and middle cerebral arteries are patent. Mild luminal regularity of the middle cerebral arteries. No large vessel occlusion, high-grade stenosis. Posterior circulation: RIGHT vertebral artery is dominant. Basilar artery is patent, with normal flow related enhancement of the main branch vessels. Flow related enhancement of the posterior cerebral arteries with mild luminal regularity. No large vessel occlusion, high-grade stenosis, aneurysm. Evolving RIGHT frontal lobe hematoma with marginal T1 shortening compatible with subacute blood products. Small amount of subacute RIGHT cerebrum subarachnoid hemorrhage partially imaged. MRA NECK FINDINGS Source images and MIP image were reviewed. The common carotid arteries are widely patent bilaterally.  The carotid bifurcation is patent bilaterally and there is no significant carotid stenosis. Both vertebral arteries and patent to the basilar without significant vertebral stenosis. There is no evidence for atherosclerosis or flow limiting stenosis. IMPRESSION: MRA HEAD: Focally ectatic bilateral carotid termini, in  part related to posterior communicating artery infundibulum. No emergent large vessel occlusion or acute vascular process. Mild luminal irregularity of cerebral arteries compatible with atherosclerosis. MRA NECK:  Negative. Electronically Signed   By: Elon Alas M.D.   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 History of cancer. EXAM: MRA NECK WITHOUT AND WITH CONTRAST MRA HEAD WITHOUT CONTRAST TECHNIQUE: Multiplanar and multiecho pulse sequences of the neck were obtained without and with intravenous contrast. Angiographic images of the neck were obtained using MRA technique without and with intravenous contrast.; Angiographic images of the Circle of Willis were obtained using MRA technique without intravenous contrast. CONTRAST:  77mL MULTIHANCE GADOBENATE DIMEGLUMINE 529 MG/ML IV SOLN COMPARISON:  MRI of the brain September 10, 2015 FINDINGS: MRA HEAD FINDINGS Mildly motion degraded examination. Anterior circulation: Flow related enhancement within the cervical, petrous, cavernous and supraclinoid internal carotid arteries, with focally ectatic bilateral carotid terminus, at posterior communicating artery origin. The anterior and middle cerebral arteries are patent. Mild luminal regularity of the middle cerebral arteries. No large vessel occlusion, high-grade stenosis. Posterior circulation: RIGHT vertebral artery is dominant. Basilar artery is patent, with normal flow related enhancement of the main branch vessels. Flow related enhancement of the posterior cerebral arteries with mild luminal regularity. No large vessel occlusion, high-grade stenosis, aneurysm. Evolving RIGHT frontal lobe hematoma with marginal T1 shortening compatible with subacute blood products. Small amount of subacute RIGHT cerebrum subarachnoid hemorrhage partially imaged. MRA NECK FINDINGS Source images and MIP  image were reviewed. The common carotid arteries are widely patent bilaterally. The carotid bifurcation is patent bilaterally and there is no significant carotid stenosis. Both vertebral arteries and patent to the basilar without significant vertebral stenosis. There is no evidence for atherosclerosis or flow limiting stenosis. IMPRESSION: MRA HEAD: Focally ectatic bilateral carotid termini, in part related to posterior communicating artery infundibulum. No emergent large vessel occlusion or acute vascular process. Mild luminal irregularity of cerebral arteries compatible with atherosclerosis. MRA NECK:  Negative. Electronically Signed   By: Elon Alas M.D.   On: 09/12/2015 02:33    Assessment/Plan: Diagnosis: Traumatic ICH Labs and images independently reviewed.  Records reviewed and summated above.   1. Does the need for close, 24 hr/day medical supervision in concert with the patient's rehab needs make it unreasonable for this patient to be served in a less intensive setting? Potentially  2. Co-Morbidities requiring supervision/potential complications: HTN (monitor and provide prns in accordance with increased physical exertion and pain), goblet cell carcinoid status post hemicolectomy, ABLA (transfuse if necessary to ensure appropriate perfusion for increased activity tolerance), Thrombocytopenia (< 60,000/mm3 no resistive exercise), elevated Troponins (cont to monitor, consider further workup if necessary) 3. Due to bladder management, safety, skin/wound care, disease management, medication administration and patient education, does the patient require 24 hr/day rehab nursing? Yes 4. Does the patient require coordinated care of a physician, rehab nurse, PT (1-2 hrs/day, 5 days/week), OT (1-2 hrs/day, 5 days/week) and SLP (1-2 hrs/day, 5 days/week) to address physical and functional deficits in the context of the above medical diagnosis(es)? Yes Addressing deficits in the following areas:  balance, endurance, locomotion,  strength, transferring, bowel/bladder control, toileting, cognition and psychosocial support 5. Can the patient actively participate in an intensive therapy program of at least 3 hrs of therapy per day at least 5 days per week? Yes 6. The potential for patient to make measurable gains while on inpatient rehab is excellent 7. Anticipated functional outcomes upon discharge from inpatient rehab are modified independent  with PT, modified independent with OT, supervision with SLP. 8. Estimated rehab length of stay to reach the above functional goals is: 16-19 days. 9. Does the patient have adequate social supports and living environment to accommodate these discharge functional goals? Yes 10. Anticipated D/C setting: Home 11. Anticipated post D/C treatments: HH therapy and Home excercise program 12. Overall Rehab/Functional Prognosis: good  RECOMMENDATIONS: This patient's condition is appropriate for continued rehabilitative care in the following setting: CIR once medically stable (plan for troponins trending up) Patient has agreed to participate in recommended program. Potentially Note that insurance prior authorization may be required for reimbursement for recommended care.  Comment: Rehab Admissions Coordinator to follow up.  Delice Lesch, MD 09/13/2015

## 2015-09-13 NOTE — Progress Notes (Signed)
STROKE TEAM PROGRESS NOTE   SUBJECTIVE (INTERVAL HISTORY) Her daughter is at the bedside.  Patient with no new complaints. Daughter and patient feel improved. EEG negative for seizure. Globally discussed discharge disposition. Deferred to attending.   OBJECTIVE Temp:  [97.7 F (36.5 C)-98.4 F (36.9 C)] 97.7 F (36.5 C) (03/14 1003) Pulse Rate:  [51-63] 54 (03/14 1003) Cardiac Rhythm:  [-] Sinus bradycardia (03/14 0700) Resp:  [15-18] 16 (03/14 1003) BP: (118-156)/(53-67) 138/67 mmHg (03/14 1003) SpO2:  [97 %-100 %] 100 % (03/14 1003) Weight:  [64.2 kg (141 lb 8.6 oz)] 64.2 kg (141 lb 8.6 oz) (03/13 2130)  CBC:   Recent Labs Lab 09/10/15 2022 09/12/15 0706  WBC 6.0 7.8  NEUTROABS 2.7  --   HGB 10.7* 10.7*  HCT 33.1* 34.1*  MCV 74.5* 73.7*  PLT 154 142*    Basic Metabolic Panel:   Recent Labs Lab 09/10/15 2022 09/11/15 0827  NA 140 140  K 4.8 4.5  CL 103 106  CO2 26 24  GLUCOSE 113* 127*  BUN 15 11  CREATININE 1.03* 0.93  CALCIUM 9.4 9.3    Lipid Panel:     Component Value Date/Time   CHOL 148 09/11/2015 0520   TRIG 44 09/11/2015 0520   HDL 55 09/11/2015 0520   CHOLHDL 2.7 09/11/2015 0520   VLDL 9 09/11/2015 0520   LDLCALC 84 09/11/2015 0520   HgbA1c:  Lab Results  Component Value Date   HGBA1C 6.0* 09/11/2015   Urine Drug Screen: No results found for: LABOPIA, COCAINSCRNUR, LABBENZ, AMPHETMU, THCU, LABBARB    IMAGING I have personally reviewed the radiological images below and agree with the radiology interpretations.  Ct Head Wo Contrast 09/10/2015   2.5 cm hemorrhage within the right frontal lobe with extensive white matter hypodensity throughout the right cerebral hemisphere. While this could be posttraumatic, given the degree of hypodensity throughout the right cerebral hemisphere, I cannot exclude underlying brain lesions. Recommend further evaluation with MRI with and without contrast.   Ct Chest W Contrast 09/11/2015   No primary  malignancy or metastatic disease identified in the chest.   Ct Cervical Spine Wo Contrast 09/10/2015   No acute bony abnormality in the cervical spine.   Mr [name] Contrast 09/10/2015   Subacute RIGHT frontal lobe 5.3 x 3.3 cm hemorrhage, with disproportionate marginal enhancement and mass effect, concerning for underlying mass though not distinctly demonstrated. Recommend 10-14 day follow-up MRI of the brain with contrast.  RIGHT frontoparietal subarachnoid hemorrhage. Multiple subcentimeter foci of acute ischemia (less likely nonenhancing metastasis) within the supra and infratentorial brain, predominately in posterior circulation favoring embolic phenomena. 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   MRA of head and neck -  MRA HEAD: Focally ectatic bilateral carotid termini, in part related to posterior communicating artery infundibulum. No emergent large vessel occlusion or acute vascular process. Mild luminal irregularity of cerebral arteries compatible with atherosclerosis. MRA NECK: Negative.  CT abdomen and pelvis 08/2015 -  Interval omentectomy and right hemicolectomy with ileocolonic anastomosis. No bowel obstruction. No definite metastatic disease identified within the abdomen and pelvis. Indeterminate hepatic hypodensities and nodular thickening of the lateral limb left adrenal gland are similar to prior. Recommend continued attention on follow-up. Other unchanged findings as above.  US thyroid - stable bilateral thyroid nodules.  CT chest 12/2014 Chest wall/thoracic inlet: Conglomeration of several  nonenlarged lymph nodes in the right supraclavicular region. Thyroid: There is a 1.8 x 0.9 cm right thyroid nodule. Mediastinum/hila: There is a 9 mm left paraesophageal lymph node on series 2 image 83. Small hiatal hernia. Heart/vessels: Coronary  atherosclerosis. Aortic valve calcifications. Aortic and branch vessel atherosclerosis. Small amount of pericardial fluid. Lungs: Right middle lobe segmental atelectasis. Pleura: Small right pleural effusion decreased from prior. There is a 3 mm right lower lobe nodule on series 4 image 67.  2D echo  - Left ventricle: The cavity size was normal. There was mild focal basal hypertrophy of the septum. Systolic function was normal. The estimated ejection fraction was in the range of 55% to 60%. Wall motion was normal; there were no regional wall motion abnormalities. Doppler parameters are consistent with abnormal left ventricular relaxation (grade 1 diastolic dysfunction). - Aortic valve: There was mild regurgitation directed towards the mitral anterior leaflet. - Left atrium: The atrium was mildly dilated. - Pericardium, extracardiac: A small pericardial effusion was identified posterior to the heart. There was no evidence of hemodynamic compromise. Impressions:  No cardiac source of emboli was indentified.  EEG - This is a normal routine EEG of the awake and drowsy states, without activating procedures. A normal study does not rule out the possibility of a seizure disorder in this patient.   PHYSICAL EXAM  Temp:  [97.7 F (36.5 C)-98.4 F (36.9 C)] 97.7 F (36.5 C) (03/14 1003) Pulse Rate:  [51-63] 54 (03/14 1003) Resp:  [15-18] 16 (03/14 1003) BP: (118-156)/(53-67) 138/67 mmHg (03/14 1003) SpO2:  [97 %-100 %] 100 % (03/14 1003) Weight:  [64.2 kg (141 lb 8.6 oz)] 64.2 kg (141 lb 8.6 oz) (03/13 2130)  General - Well nourished, well developed, in no apparent distress.  Ophthalmologic - Fundi not visualized due to eye movement.  Cardiovascular - Regular rate and rhythm with no murmur.  Mental Status -  Level of arousal and orientation to time, place, and person were intact. Language including expression, naming, repetition, comprehension was assessed and found intact. Able to follow  two-step commands.   Fund of Knowledge was assessed and was intact.  Cranial Nerves II - XII - II - Visual field intact OU. III, IV, VI - Extraocular movements intact. V - Facial sensation intact bilaterally. VII - left nasolabial fold flattening. VIII - Hearing & vestibular intact bilaterally. X - Palate elevates symmetrically. XI - Chin turning & shoulder shrug intact bilaterally. XII - Tongue protrusion intact.  Motor Strength - The patient's strength was normal in all extremities and pronator drift was absent.  Bulk was normal and fasciculations were absent.   Motor Tone - Muscle tone was assessed at the neck and appendages and was normal.  Reflexes - The patient's reflexes were 1+ in all extremities and she had no pathological reflexes.  Sensory - Light touch, temperature/pinprick were assessed and were symmetrical.    Coordination - The patient had normal movements in the hands with no ataxia or dysmetria but slow in action.  Tremor was absent.  Gait and Station - not tested due to safety concerns.    ASSESSMENT/PLAN Ms. MILCA MCERLEAN is a 70 y.o. female with history of PVD, hypertension, colon cancer s/p hemicolectomy 12/2014,  HLD, and recent fall with head injury presenting with intracranial hemorrhage.  She did not receive IV t-PA due to hemorrhage.  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 EF 55-60%   EEG normal  Repeat MRI with contrast in 4 weeks after ICH resolution to rule out metastasis to brain  Embolic stroke hemorrhagic transformation  not able to rule out at this time, will need 30 day cardiac event monitoring as outpt  LDL - 84  HgbA1c 6.0  VTE prophylaxis - SCDs Diet Heart Room service appropriate?: Yes; Fluid consistency:: Thin  No antithrombotic prior to admission, now on No antithrombotic secondary to hemorrhage.  Patient counseled to be compliant with her antithrombotic medications  Ongoing aggressive stroke risk factor management  Therapy recommendations: CIR  Disposition: Pending  (lives with husband and her son, son is on HD and cannot provide her care, she was caring for her son)  Confusion   Reported intermittent confusion by therapist and daughter  EEG - normal  Worsening cognitive impairment in the setting of frontal ICH or underlying malignancy.  Supportive care  Maintain normal wake-sleep cycle.  Hx of colon cancer  S/p hemicolectomy in [facility] in 12/2014  Recent pan CT did not show metastasis  Follow up with oncologist in [facility]  Repeat MRI with contrast in 4 weeks after ICH resolution to rule out metastasis to brain  paraclinoid aneurysm  7x8mm  Refer to Dr. [name] as outpt.  Hypertension  BP goal < 160 due to ICH  stable  Hyperlipidemia  Home meds:  Crestor 10 mg daily resumed in hospital  LDL 84, goal < 70  Continue statin at discharge  Other Stroke Risk Factors  Advanced age  Cigarette smoker, quit smoking   Hx stroke/TIA - by MRI  Other Active Problems  Anemia  Mildly elevated creatinine  NOTHING FURTHER TO ADD FROM THE STROKE STANDPOINT  Patient has a 10-15% risk of having another stroke over the next year, the highest risk is within 2 weeks of the most recent stroke/TIA (risk of having a stroke following a stroke or TIA is the same).  Ongoing risk factor control by Primary Care Physician  Stroke Service will sign off. Please call should any needs arise.  Follow-up Stroke Clinic at [facility] with Dr. Rosalin Hawking in 1  months, order placed.   Hospital day # 3  Neurology will sign off. Please call with questions. Pt will follow up with Dr. Erlinda Hong at [facility] in about 1 month. Thanks for the consult.  Rosalin Hawking, MD PhD Stroke Neurology 09/13/2015 4:12 PM    To contact Stroke Continuity provider, please refer to [email or web address] After hours, contact General Neurology

## 2015-09-13 NOTE — Care Management Note (Signed)
Case Management Note  Patient Details  Name: ENZLEY BURKET MRN: [id number] Date of Birth: July 18, 1945  Subjective/Objective:                    Action/Plan: Patient was admitted with CVA. Lives at home with spouse. Will follow for discharge needs pending PT/OT evals and physician orders.  Expected Discharge Date:                  Expected Discharge Plan:     In-House Referral:     Discharge planning Services     Post Acute Care Choice:    Choice offered to:     DME Arranged:    DME Agency:     HH Arranged:    HH Agency:     Status of Service:  In process, will continue to follow  Medicare Important Message Given:    Date Medicare IM Given:    Medicare IM give by:    Date Additional Medicare IM Given:    Additional Medicare Important Message give by:     If discussed at [place] of Stay Meetings, dates discussed:    Additional CommentsRolm Baptise, RN 09/13/2015, 12:06 PM [phone]

## 2015-09-14 ENCOUNTER — Telehealth: Payer: Self-pay | Admitting: Hematology and Oncology

## 2015-09-14 ENCOUNTER — Inpatient Hospital Stay (HOSPITAL_COMMUNITY)
Admission: RE | Admit: 2015-09-14 | Discharge: 2015-09-23 | DRG: 949 | Disposition: A | Discharge status: Home Health | Payer: Medicare Other | Source: Intra-hospital | Attending: Physical Medicine & Rehabilitation | Admitting: Physical Medicine & Rehabilitation

## 2015-09-14 DIAGNOSIS — D696 Thrombocytopenia, unspecified: Secondary | ICD-10-CM

## 2015-09-14 DIAGNOSIS — Z9049 Acquired absence of other specified parts of digestive tract: Secondary | ICD-10-CM | POA: Diagnosis not present

## 2015-09-14 DIAGNOSIS — R7989 Other specified abnormal findings of blood chemistry: Secondary | ICD-10-CM | POA: Diagnosis not present

## 2015-09-14 DIAGNOSIS — Z923 Personal history of irradiation: Secondary | ICD-10-CM | POA: Diagnosis not present

## 2015-09-14 DIAGNOSIS — Z79899 Other long term (current) drug therapy: Secondary | ICD-10-CM | POA: Diagnosis not present

## 2015-09-14 DIAGNOSIS — G441 Vascular headache, not elsewhere classified: Secondary | ICD-10-CM | POA: Diagnosis present

## 2015-09-14 DIAGNOSIS — I10 Essential (primary) hypertension: Secondary | ICD-10-CM | POA: Diagnosis not present

## 2015-09-14 DIAGNOSIS — Z87891 Personal history of nicotine dependence: Secondary | ICD-10-CM

## 2015-09-14 DIAGNOSIS — I612 Nontraumatic intracerebral hemorrhage in hemisphere, unspecified: Secondary | ICD-10-CM | POA: Diagnosis not present

## 2015-09-14 DIAGNOSIS — E785 Hyperlipidemia, unspecified: Secondary | ICD-10-CM

## 2015-09-14 DIAGNOSIS — R404 Transient alteration of awareness: Secondary | ICD-10-CM | POA: Diagnosis present

## 2015-09-14 DIAGNOSIS — D62 Acute posthemorrhagic anemia: Secondary | ICD-10-CM

## 2015-09-14 DIAGNOSIS — I69319 Unspecified symptoms and signs involving cognitive functions following cerebral infarction: Secondary | ICD-10-CM

## 2015-09-14 DIAGNOSIS — S06340D Traumatic hemorrhage of right cerebrum without loss of consciousness, subsequent encounter: Principal | ICD-10-CM

## 2015-09-14 DIAGNOSIS — Z9221 Personal history of antineoplastic chemotherapy: Secondary | ICD-10-CM

## 2015-09-14 DIAGNOSIS — I611 Nontraumatic intracerebral hemorrhage in hemisphere, cortical: Secondary | ICD-10-CM | POA: Diagnosis not present

## 2015-09-14 DIAGNOSIS — S06340S Traumatic hemorrhage of right cerebrum without loss of consciousness, sequela: Secondary | ICD-10-CM

## 2015-09-14 DIAGNOSIS — I619 Nontraumatic intracerebral hemorrhage, unspecified: Secondary | ICD-10-CM | POA: Diagnosis present

## 2015-09-14 DIAGNOSIS — I671 Cerebral aneurysm, nonruptured: Secondary | ICD-10-CM | POA: Diagnosis not present

## 2015-09-14 DIAGNOSIS — W06XXXD Fall from bed, subsequent encounter: Secondary | ICD-10-CM | POA: Diagnosis not present

## 2015-09-14 DIAGNOSIS — C801 Malignant (primary) neoplasm, unspecified: Secondary | ICD-10-CM

## 2015-09-14 DIAGNOSIS — Z85030 Personal history of malignant carcinoid tumor of large intestine: Secondary | ICD-10-CM | POA: Diagnosis not present

## 2015-09-14 LAB — TROPONIN I
TROPONIN I: 0.11 ng/mL — AB (ref <0.031)
Troponin I: 0.1 ng/mL — ABNORMAL HIGH (ref <0.031)
Troponin I: 0.09 ng/mL — ABNORMAL HIGH (ref <0.031)

## 2015-09-14 MED ORDER — SORBITOL 70 % SOLN: 30.0000 mL | Freq: Every day | Status: DC | PRN

## 2015-09-14 MED ORDER — ROSUVASTATIN CALCIUM 10 MG PO TABS
10.0000 mg | ORAL_TABLET | Freq: Every day | ORAL | Status: DC
  Administered 2015-09-15 – 2015-09-22 (×8): 10 mg via ORAL
  Filled 2015-09-14 (×8): qty 1

## 2015-09-14 MED ORDER — DEXAMETHASONE 6 MG PO TABS: 6.0000 mg | ORAL_TABLET | Freq: Three times a day (TID) | ORAL | Status: DC

## 2015-09-14 MED ORDER — PANTOPRAZOLE SODIUM 40 MG PO TBEC: 40.0000 mg | DELAYED_RELEASE_TABLET | Freq: Every day | ORAL | Status: DC

## 2015-09-14 MED ORDER — ONDANSETRON HCL 4 MG PO TABS: 4.0000 mg | ORAL_TABLET | Freq: Four times a day (QID) | ORAL | Status: DC | PRN

## 2015-09-14 MED ORDER — POLYETHYLENE GLYCOL 3350 17 G PO PACK
17.0000 g | PACK | Freq: Every day | ORAL | Status: DC
Start: 2015-09-15 — End: 2015-09-23
  Administered 2015-09-15 – 2015-09-19 (×5): 17 g via ORAL
  Filled 2015-09-14 (×8): qty 1

## 2015-09-14 MED ORDER — AMLODIPINE BESYLATE 5 MG PO TABS
5.0000 mg | ORAL_TABLET | Freq: Every day | ORAL | Status: DC
  Administered 2015-09-15 – 2015-09-23 (×9): 5 mg via ORAL
  Filled 2015-09-14 (×9): qty 1

## 2015-09-14 MED ORDER — FERROUS SULFATE 325 (65 FE) MG PO TABS
325.0000 mg | ORAL_TABLET | Freq: Every day | ORAL | Status: DC
  Administered 2015-09-15 – 2015-09-23 (×9): 325 mg via ORAL
  Filled 2015-09-14 (×9): qty 1

## 2015-09-14 MED ORDER — OXYCODONE-ACETAMINOPHEN 5-325 MG PO TABS: 1.0000 | ORAL_TABLET | Freq: Four times a day (QID) | ORAL | Status: DC | PRN

## 2015-09-14 MED ORDER — AMLODIPINE BESYLATE 10 MG PO TABS: 5.0000 mg | ORAL_TABLET | Freq: Every day | ORAL | Status: DC

## 2015-09-14 MED ORDER — ACETAMINOPHEN 325 MG PO TABS
650.0000 mg | ORAL_TABLET | Freq: Every day | ORAL | Status: DC | PRN
  Administered 2015-09-15: 650 mg via ORAL
  Filled 2015-09-14: qty 2

## 2015-09-14 MED ORDER — OXYCODONE-ACETAMINOPHEN 5-325 MG PO TABS: 1.0000 | ORAL_TABLET | ORAL | Status: DC | PRN

## 2015-09-14 MED ORDER — PANTOPRAZOLE SODIUM 40 MG PO TBEC
40.0000 mg | DELAYED_RELEASE_TABLET | Freq: Every day | ORAL | Status: DC
  Administered 2015-09-16 – 2015-09-23 (×8): 40 mg via ORAL
  Filled 2015-09-14 (×8): qty 1

## 2015-09-14 MED ORDER — DEXAMETHASONE 6 MG PO TABS
6.0000 mg | ORAL_TABLET | Freq: Three times a day (TID) | ORAL | Status: DC
  Administered 2015-09-14 – 2015-09-21 (×22): 6 mg via ORAL
  Filled 2015-09-14 (×22): qty 1

## 2015-09-14 MED ORDER — ONDANSETRON HCL 4 MG/2ML IJ SOLN: 4.0000 mg | Freq: Four times a day (QID) | INTRAMUSCULAR | Status: DC | PRN

## 2015-09-14 MED ORDER — AMLODIPINE BESYLATE 5 MG PO TABS
5.0000 mg | ORAL_TABLET | Freq: Every day | ORAL | Status: DC
  Administered 2015-09-14: 5 mg via ORAL
  Filled 2015-09-14: qty 1

## 2015-09-14 NOTE — Telephone Encounter (Signed)
Received call from Dr. Sloan Leiter to schedule patient for 3wk follow-up appt. Patient was consulted by Dr. Marin Olp, s/w patient dtr [place] and requested to be seen in [place]. Staff message sent to [facility] at [place] of patient appt.  04/03 @ 3:45 w/Dr. Lindi Adie welcome packet mailed.

## 2015-09-14 NOTE — Progress Notes (Signed)
Inpatient Rehabilitation  I have received insurance approval and medical clearance from Dr. Sloan Leiter to admit pt. To IP rehab today.  Pt. And husband are pleased and agreeable.  I have updated Jacqualin Combes, RNCM and Evette Cristal, CSW as wall as pt's Agricultural consultant.  I will arrange for admission later today.  Please call if questions.  [place] Admissions Coordinator Cell [phone] Office [phone]

## 2015-09-14 NOTE — Progress Notes (Signed)
Patient ID: Andrea Keller, female   DOB: 1945/08/08, 70 y.o.   MRN: [id number] Patient admitted to [phone] via wheelchair, escorted by nursing staff and daughter.  Patient and daughter verbalized understanding of rehab process, signed fall safety agreement.  Appears to be in no immediate distress at this time.  Brita Romp, RN

## 2015-09-14 NOTE — Interval H&P Note (Signed)
Andrea Keller was admitted today to Inpatient Rehabilitation with the diagnosis of traumatic ICH.  The patient's history has been reviewed, patient examined, and there is no change in status.  Patient continues to be appropriate for intensive inpatient rehabilitation.  I have reviewed the patient's chart and labs.  Questions were answered to the patient's satisfaction. The PAPE has been reviewed and assessment remains appropriate.  Andrea Keller 09/14/2015, 10:23 PM

## 2015-09-14 NOTE — Care Management Important Message (Signed)
Important Message  Patient Details  Name: Andrea Keller MRN: [id number] Date of Birth: Aug 25, 1945   Medicare Important Message Given:  Yes    Verlaine Embry P [place] 09/14/2015, 11:05 AM

## 2015-09-14 NOTE — Clinical Social Work Note (Addendum)
CSW received referral for SNF.  Case discussed with case manager, and plan is to discharge to CIR.  CSW to sign off please re-consult if social work needs arise.  Jones Broom. [place], MSW, [place]

## 2015-09-14 NOTE — Progress Notes (Signed)
Ankit Lorie Phenix, MD Physician Signed Physical Medicine and Rehabilitation PMR Pre-admission 09/14/2015 3:09 PM  Related encounter: ED to Hosp-Admission (Current) from 09/10/2015 in [place] Collapse All   PMR Admission Coordinator Pre-Admission Assessment  Patient: Andrea Keller is an 70 y.o., female MRN: [id number] DOB: Dec 21, 1945 Height: 5\' 3"  (160 cm) Weight: 64.2 kg (141 lb 8.6 oz)  Insurance Information HMO: yes PPO: PCP: IPA: 80/20: OTHER:  PRIMARY: [employer] Medicare Policy#: [id number] Subscriber: self CM Name: Orvan July Phone#: [phone] Fax#: [phone]: follow up to be done by Sherlynn Stalls (p) [phone]; (f) [phone] via EMR access Pre-Cert#: [id number] Employer: retired Benefits: Phone #: [phone] Name: Illene Labrador. Date: 07/03/15 Deduct: $0 Out of Pocket Max: $4900 Life Max: none CIR: $345 days 1-5; $0 days 6 and beyond SNF: $0 days 1-20; $160 days 21-51; $0 days 52-100 Outpatient: $40 copay per therapy Co-Pay:  Home Health: 100% Co-Pay:  DME: 80% Co-Pay: 20% Providers: In network SECONDARY: [facility] Policy#: Subscriber:  CM Name: Phone#: Fax#:  Pre-Cert#: Employer:  Benefits: Phone #: Name:  Eff. Date: Deduct: Out of Pocket Max: Life Max:  CIR: SNF:  Outpatient: Co-Pay:  Home Health: Co-Pay:  DME: Co-Pay:   Medicaid Application Date: Case Manager:  Disability Application Date: Case Worker:   Emergency Contact Information Contact Information    Name Relation Home Work Mobile   Bezek,John Spouse [phone]  [phone]     Current Medical History    Patient Admitting Diagnosis: Traumatic ICH History of Present Illness: Andrea Keller is a 70 y.o. 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 Patient lives with spouse and assistance is needed. Independent prior to admission still driving. One level home with 4 steps to entry. Presented 09/10/2015 with persistent frontal headaches and generalized weakness as well as difficulty in concentrating. She did have a recent fall 09/09/2015. Her head on a dresser. CT of the head showed a 2.5 cm hemorrhage within the right frontal lobe with extensive white matter hypodensity throughout the right cerebral hemisphere. MRI completed showed right frontal lobe 5.3 x 3.3 cm hemorrhage with disproportionate marginal enhancement and mass effect/right frontoparietal subarachnoid hemorrhage. EEG negative seizure. CT of the chest negative. 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 Sabas Sous as an outpatient. Neurology consulted plan repeat MRI in 2-4 weeks for ongoing evaluation of either hemorrhagic metastasis or hemorrhagic infarct. Decadron protocol. Mildly elevated troponins with diffuse T-wave inversions likely due to [place]. Echocardiogram 09/13/2015 with ejection fraction of [id number] grade 1 diastolic dysfunction. No cardiac source of emboli. Tolerating a regular diet. Physical and Occupational therapy evaluations completed with recommendations of physical medicine rehabilitation consult.Patient was admitted for a comprehensive rehabilitation program Total: 1 NIH    Past Medical History  Past Medical History  Diagnosis Date  . Diverticulosis   . Hyperplastic colon polyp   . Insomnia   . Microcytosis   . Osteoarthritis   . PVD (peripheral vascular disease)  ([place])   . Multiple thyroid nodules   . Hypertension   . Goblet cell carcinoid ([place]) 11/08/2014    Family History  family history includes Colon cancer in her brother; Diabetes in her mother; Lung cancer in her father.  Prior Rehab/Hospitalizations:  Has the patient had major surgery during  100 days prior to admission? No surgeries in past 4 months; no prior rehab  Current Medications   Current facility-administered medications:  . acetaminophen (TYLENOL) tablet 650 mg, 650 mg, Oral, Daily PRN, Ivor Costa, MD, 650 mg at 09/13/15 1008 . amLODipine (NORVASC) tablet 5 mg, 5 mg, Oral, Daily, Jonetta Osgood, MD, 5 mg at 09/14/15 0937 . dexamethasone (DECADRON) tablet 6 mg, 6 mg, Oral, 3 times per day, Jonetta Osgood, MD, 6 mg at 09/14/15 1345 . ferrous sulfate tablet 325 mg, 325 mg, Oral, Q breakfast, Ivor Costa, MD, 325 mg at 09/14/15 0810 . ondansetron (ZOFRAN) injection 4 mg, 4 mg, Intravenous, Q6H PRN, Jonetta Osgood, MD . oxyCODONE-acetaminophen (PERCOCET/ROXICET) 5-325 MG per tablet 1-2 tablet, 1-2 tablet, Oral, Q4H PRN, Ivor Costa, MD . pantoprazole (PROTONIX) EC tablet 40 mg, 40 mg, Oral, Q1200, Jonetta Osgood, MD, 40 mg at 09/14/15 0936 . polyethylene glycol (MIRALAX / GLYCOLAX) packet 17 g, 17 g, Oral, Daily, Jonetta Osgood, MD, 17 g at 09/13/15 2254 . rosuvastatin (CRESTOR) tablet 10 mg, 10 mg, Oral, q1800, Rosalin Hawking, MD, 10 mg at 09/13/15 1714 . senna-docusate (Senokot-S) tablet 1 tablet, 1 tablet, Oral, QHS PRN, Ivor Costa, MD, 1 tablet at 09/13/15 0654 . zolpidem (AMBIEN) tablet 5 mg, 5 mg, Oral, QHS PRN, Ivor Costa, MD  Patients Current Diet: Diet Heart Room service appropriate?: Yes; Fluid consistency:: Thin Diet - low sodium heart healthy  Precautions / Restrictions Precautions Precautions: Fall Restrictions Weight Bearing Restrictions: No   Has the patient had 2 or more falls or a fall with injury in the past year?Yes, fell on 09/09/15  striking her head  Prior Activity Level Limited Community (1-2x/wk): Per pt. and husband, pt. is out of the house about twice per week, once to go shopping at [place] and once to go to [employer] / [place] Devices/Equipment: None Home Equipment: Environmental consultant - 2 wheels  Prior Device Use: Indicate devices/aids used by the patient prior to current illness, exacerbation or injury? None of the above  Prior Functional Level Prior Function Level of Independence: Independent Comments: still driving, Ind w/ all mobility and ADLs  Self Care: Did the patient need help bathing, dressing, using the toilet or eating? Independentreports she needs increased time with putting on socks and pants  Indoor Mobility: Did the patient need assistance with walking from room to room (with or without device)? Independent  Stairs: Did the patient need assistance with internal or external stairs (with or without device)? Independent  Functional Cognition: Did the patient need help planning regular tasks such as shopping or remembering to take medications? Independent  Current Functional Level Cognition  Arousal/Alertness: Awake/alert Overall Cognitive Status: (unsure of baseline) Current Attention Level: Selective Orientation Level: Oriented X4 Following Commands: Follows one step commands inconsistently Safety/Judgement: Decreased awareness of safety, Decreased awareness of deficits (L sided neglect) General Comments: cues given to attend to left   Extremity Assessment (includes Sensation/Coordination)  Upper Extremity Assessment: LUE deficits/detail LUE Deficits / Details: Difficulty engaging LUE spontaneously. alien like movements at times. R bias LUE Coordination: decreased fine motor  Lower Extremity Assessment: Defer to PT evaluation    ADLs  Overall ADL's : Needs assistance/impaired Eating/Feeding Details (indicate cue type and reason): will further  assess Grooming: Oral care, Standing, Set up, Supervision/safety (also wiped off hand) Grooming Details (indicate cue type and reason): washed hands at sink and walked away without drying hands.Required mod vc to find paper towel holder  Upper Body Bathing: Minimal assitance Lower Body Bathing: Moderate assistance Upper Body Dressing : Moderate assistance Lower Body Dressing: Moderate assistance Lower Body Dressing Details (indicate cue type and reason): perseerating on removing shoes. continued to try to put shoes back on while given command toremove shoes. Given command tohand therapist belt and pt continued to try to thread belt loop and was unable to terminate actio to hand therapist bilet. Required hand over hand to terminate activity. Toilet Transfer: Min guard, Ambulation (sit to stand from bed) Toileting- Clothing Manipulation and Hygiene: Minimal assistance (standing) Toileting - Clothing Manipulation Details (indicate cue type and reason): pt pulled down underwear part way to wash peri area and bottom; also dryed off peri area; assist to pull up left side of underwear Functional mobility during ADLs: Min guard General ADL Comments: Cues to attend to left. Pt seemed to have slow processing.     Mobility  Overal bed mobility: Needs Assistance Bed Mobility: Supine to Sit, Sit to Supine Supine to sit: Modified independent (Device/Increase time) Sit to supine: (see comments) General bed mobility comments: pt able to go from sit to supine with Mod I, but assist given to scoot to [facility].    Transfers  Overall transfer level: Needs assistance Equipment used: None, 1 person hand held assist Transfers: Sit to/from Stand Sit to Stand: Supervision General transfer comment: slow to stand, v/c's for technique    Ambulation / Gait / Stairs / Wheelchair Mobility  Ambulation/Gait Ambulation/Gait assistance: Museum/gallery curator (Feet): 300 Feet Assistive device: None Gait  Pattern/deviations: Decreased step length - right, Shuffle, Narrow base of support General Gait Details: extremely slow, guarded, trunk flexed holding hands near body. no episodes of LOB however pt unable to navigate around obstacles on the L, find her way back to her room, process multi-step commands to complete the DGI. pt unable to problem solve on how to navigate around obstacle once she ran into it Gait velocity interpretation: <1.8 ft/sec, indicative of risk for recurrent falls Stairs: Yes Stairs assistance: Min assist Stair Management: One rail Right, Step to pattern, Forwards Number of Stairs: 4 General stair comments: Pt requires max verbal cues and tactile cues and took a few minutes to grasp task of descending the steps. Min assist to steady.    Posture / Balance Balance Overall balance assessment: Needs assistance, History of Falls Sitting-balance support: No upper extremity supported, Feet supported Sitting balance-Leahy Scale: Good Standing balance support: No upper extremity supported, During functional activity Standing balance-Leahy Scale: Fair Standardized Balance Assessment Standardized Balance Assessment : Dynamic Gait Index (difficult to assess due to pt's impaired command follow) Dynamic Gait Index Level Surface: Mild Impairment Change in Gait Speed: Moderate Impairment Gait with Horizontal Head Turns: Moderate Impairment Gait with Vertical Head Turns: Mild Impairment Gait and Pivot Turn: Moderate Impairment Step Over Obstacle: Mild Impairment Step Around Obstacles: Moderate Impairment Steps: Mild Impairment Total Score: 12    Special needs/care consideration BiPAP/CPAP no CPM no Continuous Drip IV no Dialysis no  Life Vest no Oxygen no Special Bed no Trach Size no Wound Vac (area) no  Skin WDL per nursing assessment  Bowel mgmt: last BM 09/14/15, continent Bladder mgmt: Continent to  bathroom with assist Diabetic mgmt n/a     Previous Home Environment Living Arrangements: Spouse/significant other, Children Lives With: Spouse, Son Available Help at Discharge: Family, Available 24 hours/day Type of Home: House Home Layout: One level Home Access: Stairs to enter Entrance Stairs-Rails: None Entrance Stairs-Number of Steps: 4  Bathroom Shower/Tub: Gaffer, Door, Architectural technologist: Programmer, systems: Yes Home Care Services: No  Discharge Living Setting Plans for Discharge Living Setting: Patient's home Type of Home at Discharge: House Discharge Home Layout: One level Discharge Home Access: Stairs to enter Entrance Stairs-Rails: None Entrance Stairs-Number of Steps: 3 Discharge Bathroom Shower/Tub: Tub/shower unit Discharge Bathroom Toilet: Standard Discharge Bathroom Accessibility: Yes How Accessible: Accessible via walker Does the patient have any problems obtaining your medications?: No  Social/Family/Support Systems Patient Roles: Spouse, Parent (adult son lives in the home with h/o cognitive deficits) Anticipated Caregiver: husband Leshanda Crouse Anticipated Caregiver's Contact Information: [phone], husband Ability/Limitations of Caregiver: no limitations, husband is out of the home on errands several times per week Caregiver Availability: 24/7 Discharge Plan Discussed with Primary Caregiver: Yes Is Caregiver In Agreement with Plan?: Yes Does Caregiver/Family have Issues with Lodging/Transportation while Pt is in Rehab?: No   Goals/Additional Needs Patient/Family Goal for Rehab: modified independent PT/OT; supervision SLP Expected length of stay: 10-13 days Cultural Considerations: none Dietary Needs: heart healthy, thin liquids Equipment Needs: TBA Additional Information: adult son , Herbie [place] has dialysis 3x/week Pt/Family Agrees to Admission and willing to participate: Yes Program Orientation Provided & Reviewed  with Pt/Caregiver Including Roles & Responsibilities: Yes   Decrease burden of Care through IP rehab admission: no   Possible need for SNF placement upon discharge: Not anticipated   Patient Condition: This patient's condition remains as documented in the consult dated 09/13/15 , in which the Rehabilitation Physician determined and documented that the patient's condition is appropriate for intensive rehabilitative care in an inpatient rehabilitation facility. Will admit to inpatient rehab today.  Preadmission Screen Completed By: Gerlean Ren, 09/14/2015 3:27 PM ______________________________________________________________________  Discussed status with Dr. Posey Pronto on 09/14/15 at 1525 and received telephone approval for admission today.  Admission Coordinator: Gerlean Ren, time [id number] /Date 09/14/15          Revision History     Date/Time User Provider Type Action   09/14/2015 3:36 PM Ankit Lorie Phenix, MD Physician Sign   09/14/2015 3:28 PM Gerlean Ren Rehab Admission Coordinator Sign   View Details Report

## 2015-09-14 NOTE — Progress Notes (Signed)
Ankit Lorie Phenix, MD Physician Addendum Physical Medicine and Rehabilitation Consult Note 09/13/2015 8:01 AM  Related encounter: ED to Hosp-Admission (Current) from 09/10/2015 in [facility]apse [facility]        Physical Medicine and Rehabilitation Consult Reason for Consult: Traumatic ICH Referring Physician: [facility]   HPI: Andrea Keller is a 70 y.o. 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 Patient lives with spouse and assistance is needed. Independent prior to admission still driving. One level home with 4 steps to entry. Presented 09/10/2015 with persistent frontal headaches and generalized weakness as well as difficulty in concentrating. She did have a recent fall 09/09/2015. Her head on a dresser. CT of the head showed a 2.5 cm hemorrhage within the right frontal lobe with extensive white matter hypodensity throughout the right cerebral hemisphere. MRI completed showed right frontal lobe 5.3 x 3.3 cm hemorrhage with disproportionate marginal enhancement and mass effect/right frontoparietal subarachnoid hemorrhage. EEG negative seizure. CT of the chest negative. MRA of head and neck with no emergent large vessel occlusion. Neurology consulted plan repeat MRI in 2-4 weeks. Decadron protocol. Tolerating a regular diet. Occupational therapy evaluation completed with recommendations of physical medicine rehabilitation consult.   Review of Systems  Constitutional: Negative for fever and chills.  HENT: Negative for hearing loss.  Eyes: Negative for blurred vision and double vision.  Respiratory: Negative for cough and shortness of breath.  Cardiovascular: Negative for chest pain, palpitations and leg swelling.  Gastrointestinal: Positive for constipation. Negative for nausea and vomiting.  Genitourinary: Negative for dysuria and hematuria.    Musculoskeletal: Positive for myalgias and falls.  Neurological: Positive for weakness and headaches. Negative for seizures and loss of consciousness.  Psychiatric/Behavioral: The patient has insomnia.  All other systems reviewed and are negative.  Past Medical History  Diagnosis Date  . Diverticulosis   . Hyperplastic colon polyp   . Insomnia   . Microcytosis   . Osteoarthritis   . PVD (peripheral vascular disease) ([place])   . Multiple thyroid nodules   . Hypertension   . Goblet cell carcinoid ([place]) 11/08/2014   Past Surgical History  Procedure Laterality Date  . Cyst removal neck    . Abdominal hysterectomy  2009  . Laparoscopic appendectomy N/A 10/28/2014    Procedure: APPENDECTOMY LAPAROSCOPIC; Surgeon: Erroll Luna, MD; Location: [facility]; Service: General; Laterality: N/A;  . Appendectomy  10/28/2014   Family History  Problem Relation Age of Onset  . Colon cancer Brother   . Diabetes Mother   . Lung cancer Father   . Alcohol abuse     Social History:  reports that she has quit smoking. She has never used smokeless tobacco. She reports that she does not drink alcohol or use illicit drugs. Allergies:  Allergies  Allergen Reactions  . Latex Rash   Medications Prior to Admission  Medication Sig Dispense Refill  . acetaminophen (TYLENOL) 325 MG tablet Take 650 mg by mouth daily as needed for moderate pain.     Marland Kitchen amLODipine (NORVASC) 10 MG tablet Take 10 mg by mouth daily.    . ferrous sulfate 325 (65 FE) MG tablet Take 325 mg by mouth daily with breakfast.    . rosuvastatin (CRESTOR) 10 MG tablet Take 10 mg by mouth daily.    . metroNIDAZOLE (FLAGYL) 500 MG tablet Take 1 tablet (500 mg total) by mouth 3 (three) times  daily. 21 tablet 0  . oxyCODONE-acetaminophen (PERCOCET/ROXICET) 5-325 MG per tablet Take 1-2 tablets by mouth every 4 (four) hours as needed for  moderate pain. 30 tablet 0    Home: Home Living Family/patient expects to be discharged to:: Private residence Living Arrangements: Spouse/significant other, Children Available Help at Discharge: Family, Available 24 hours/day (husband) Type of Home: House Home Access: Stairs to enter Technical brewer of Steps: 4 Entrance Stairs-Rails: None Home Layout: One level Bathroom Shower/Tub: Gaffer, Door, Architectural technologist: Standard Bathroom Accessibility: Yes Home Equipment: Environmental consultant - 2 wheels  Functional History: Prior Function Level of Independence: Independent Comments: still driving, Ind w/ all mobility and ADLs Functional Status:  Mobility: Bed Mobility Overal bed mobility: Needs Assistance Bed Mobility: Supine to Sit Supine to sit: Min assist General bed mobility comments: unable to figure out how to get out of bed on L side Transfers Overall transfer level: Needs assistance Equipment used: None, 1 person hand held assist Transfers: Sit to/from Stand Sit to Stand: Min assist General transfer comment: Slow to stand and requires min assist to steady due to LOB standing from commode Ambulation/Gait Ambulation/Gait assistance: Min assist Ambulation Distance (Feet): 300 Feet Assistive device: None Gait Pattern/deviations: Decreased stride length, Drifts right/left General Gait Details: Very decreased gait speed and pt appears confused. Pt walks on Lt side of the hallway and requires verbal and tactile cues to avoid bumping into objects on the Lt. Min assist at times to steady.  Gait velocity interpretation: <1.8 ft/sec, indicative of risk for recurrent falls Stairs: Yes Stairs assistance: Min assist Stair Management: One rail Right, Step to pattern, Forwards Number of Stairs: 4 General stair comments: Pt requires max verbal cues and tactile cues and took a few minutes to grasp task of descending the steps. Min assist to steady.     ADL: ADL Overall ADL's : Needs assistance/impaired Eating/Feeding Details (indicate cue type and reason): will further assess Grooming: Minimal assistance Grooming Details (indicate cue type and reason): washed hands at sink and walked away without drying hands.Required mod vc to find paper towel holder Upper Body Bathing: Minimal assitance Lower Body Bathing: Moderate assistance Upper Body Dressing : Moderate assistance Lower Body Dressing: Moderate assistance Lower Body Dressing Details (indicate cue type and reason): perseerating on removing shoes. continued to try to put shoes back on while given command toremove shoes. Given command tohand therapist belt and pt continued to try to thread belt loop and was unable to terminate actio to hand therapist bilet. Required hand over hand to terminate activity. Functional mobility during ADLs: Minimal assistance General ADL Comments: Pt asked to get out of bed. Rail down on L and pt trying to get out on R side with rail. Pt requried mina to get out of bed on left. Pt unable to reproduce 2 step command of follow functional sequence. Pt asked to walk to nursing station to get cupof water. Pt instead walked over to other side of bed which was on her R side. Pt unable to find her way back to her room when she was standing at the end of the nursing station. when asked to find her room number, she found it in the writing of the "purrell" hand sanitizer outside of her room.   Cognition: Cognition Overall Cognitive Status: Impaired/Different from baseline Orientation Level: Oriented X4 Cognition Arousal/Alertness: Awake/alert Behavior During Therapy: Restless Overall Cognitive Status: Impaired/Different from baseline Area of Impairment: Orientation, Attention, Memory, Safety/judgement, Following commands, Awareness, Problem solving Orientation Level: Disoriented  to, Time Current Attention Level: Sustained Memory: Decreased short-term  memory Following Commands: Follows one step commands with increased time Safety/Judgement: Decreased awareness of deficits, Decreased awareness of safety Awareness: Intellectual Problem Solving: Slow processing, Decreased initiation, Difficulty sequencing, Requires verbal cues, Requires tactile cues General Comments: Says it is January and is Tuesday. Pt requires max verbal and tactile cues for simple tasks such as walking down the steps. Pt presents w/ ideomotor apraxia and poor motor planning. Reads clock w/ hands incorrectly and begins guessing what time it is. Needs cues for sequencing w/ toileting and washing hands  Blood pressure 154/60, pulse 57, temperature 97.9 F (36.6 C), temperature source Axillary, resp. rate 18, height 5\' 3"  (1.6 m), weight 64.2 kg (141 lb 8.6 oz), SpO2 99 %. Physical Exam  Vitals reviewed. Constitutional: She is oriented to person, place, and time. She appears well-developed.  70 year old frail female  HENT:  Head: Normocephalic.  Right Ear: External ear normal.  Left Ear: External ear normal.  Eyes: Conjunctivae and EOM are normal.  Neck: Normal range of motion. Neck supple. No thyromegaly present.  Cardiovascular: Normal rate and regular rhythm.  Respiratory: Effort normal and breath sounds normal. No respiratory distress.  GI: Soft. Bowel sounds are normal. She exhibits no distension.  Musculoskeletal: She exhibits no edema or tenderness.  Neurological: She is alert and oriented to person, place, and time.  Follows commands Left inattention DTRs symmetric Sensation intact to light touch Motor: RUE/RLE: 4+/5 LUE/LLE: 4/5  Skin: Skin is warm and dry.  Psychiatric: She has a normal mood and affect. Her behavior is normal.     Lab Results Last 24 Hours    Results for orders placed or performed during the hospital encounter of 09/10/15 (from the past 24 hour(s))  Troponin I Status: Abnormal   Collection Time: 09/12/15 12:23 PM   Result Value Ref Range   Troponin I 0.10 (H) <0.031 ng/mL  Troponin I Status: Abnormal   Collection Time: 09/12/15 5:59 PM  Result Value Ref Range   Troponin I 0.09 (H) <0.031 ng/mL  Troponin I Status: Abnormal   Collection Time: 09/13/15 12:22 AM  Result Value Ref Range   Troponin I 0.08 (H) <0.031 ng/mL      Imaging Results (Last 48 hours)    Mr Angiogram Neck W Wo Contrast  09/12/2015 CLINICAL DATA: Acute onset headache, memory difficulties, 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 History of cancer. EXAM: MRA NECK WITHOUT AND WITH CONTRAST MRA HEAD WITHOUT CONTRAST TECHNIQUE: Multiplanar and multiecho pulse sequences of the neck were obtained without and with intravenous contrast. Angiographic images of the neck were obtained using MRA technique without and with intravenous contrast.; Angiographic images of the Circle of Willis were obtained using MRA technique without intravenous contrast. CONTRAST: 20mL MULTIHANCE GADOBENATE DIMEGLUMINE 529 MG/ML IV SOLN COMPARISON: MRI of the brain September 10, 2015 FINDINGS: MRA HEAD FINDINGS Mildly motion degraded examination. Anterior circulation: Flow related enhancement within the cervical, petrous, cavernous and supraclinoid internal carotid arteries, with focally ectatic bilateral carotid terminus, at posterior communicating artery origin. The anterior and middle cerebral arteries are patent. Mild luminal regularity of the middle cerebral arteries. No large vessel occlusion, high-grade stenosis. Posterior circulation: RIGHT vertebral artery is dominant. Basilar artery is patent, with normal flow related enhancement of the main branch vessels. Flow related enhancement of the posterior cerebral arteries with mild luminal regularity. No large vessel occlusion, high-grade stenosis, aneurysm. Evolving RIGHT frontal lobe hematoma with marginal T1 shortening compatible with subacute blood  products. Small amount of  subacute RIGHT cerebrum subarachnoid hemorrhage partially imaged. MRA NECK FINDINGS Source images and MIP image were reviewed. The common carotid arteries are widely patent bilaterally. The carotid bifurcation is patent bilaterally and there is no significant carotid stenosis. Both vertebral arteries and patent to the basilar without significant vertebral stenosis. There is no evidence for atherosclerosis or flow limiting stenosis. IMPRESSION: MRA HEAD: Focally ectatic bilateral carotid termini, in part related to posterior communicating artery infundibulum. No emergent large vessel occlusion or acute vascular process. Mild luminal irregularity of cerebral arteries compatible with atherosclerosis. MRA NECK: Negative. Electronically Signed By: Elon Alas M.D. 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 History of cancer. EXAM: MRA NECK WITHOUT AND WITH CONTRAST MRA HEAD WITHOUT CONTRAST TECHNIQUE: Multiplanar and multiecho pulse sequences of the neck were obtained without and with intravenous contrast. Angiographic images of the neck were obtained using MRA technique without and with intravenous contrast.; Angiographic images of the Circle of Willis were obtained using MRA technique without intravenous contrast. CONTRAST: 65mL MULTIHANCE GADOBENATE DIMEGLUMINE 529 MG/ML IV SOLN COMPARISON: MRI of the brain September 10, 2015 FINDINGS: MRA HEAD FINDINGS Mildly motion degraded examination. Anterior circulation: Flow related enhancement within the cervical, petrous, cavernous and supraclinoid internal carotid arteries, with focally ectatic bilateral carotid terminus, at posterior communicating artery origin. The anterior and middle cerebral arteries are patent. Mild luminal regularity of the middle cerebral arteries. No large vessel occlusion, high-grade stenosis. Posterior  circulation: RIGHT vertebral artery is dominant. Basilar artery is patent, with normal flow related enhancement of the main branch vessels. Flow related enhancement of the posterior cerebral arteries with mild luminal regularity. No large vessel occlusion, high-grade stenosis, aneurysm. Evolving RIGHT frontal lobe hematoma with marginal T1 shortening compatible with subacute blood products. Small amount of subacute RIGHT cerebrum subarachnoid hemorrhage partially imaged. MRA NECK FINDINGS Source images and MIP image were reviewed. The common carotid arteries are widely patent bilaterally. The carotid bifurcation is patent bilaterally and there is no significant carotid stenosis. Both vertebral arteries and patent to the basilar without significant vertebral stenosis. There is no evidence for atherosclerosis or flow limiting stenosis. IMPRESSION: MRA HEAD: Focally ectatic bilateral carotid termini, in part related to posterior communicating artery infundibulum. No emergent large vessel occlusion or acute vascular process. Mild luminal irregularity of cerebral arteries compatible with atherosclerosis. MRA NECK: Negative. Electronically Signed By: Elon Alas M.D. On: 09/12/2015 02:33     Assessment/Plan: Diagnosis: Traumatic ICH Labs and images independently reviewed. Records reviewed and summated above.   1. Does the need for close, 24 hr/day medical supervision in concert with the patient's rehab needs make it unreasonable for this patient to be served in a less intensive setting? Potentially  2. Co-Morbidities requiring supervision/potential complications: HTN (monitor and provide prns in accordance with increased physical exertion and pain), goblet cell carcinoid status post hemicolectomy, ABLA (transfuse if necessary to ensure appropriate perfusion for increased activity tolerance), Thrombocytopenia (< 60,000/mm3 no resistive exercise), elevated Troponins (cont to monitor, consider  further workup if necessary) 3. Due to bladder management, safety, skin/wound care, disease management, medication administration and patient education, does the patient require 24 hr/day rehab nursing? Yes 4. Does the patient require coordinated care of a physician, rehab nurse, PT (1-2 hrs/day, 5 days/week), OT (1-2 hrs/day, 5 days/week) and SLP (1-2 hrs/day, 5 days/week) to address physical and functional deficits in the context of the  above medical diagnosis(es)? Yes Addressing deficits in the following areas: balance, endurance, locomotion, strength, transferring, bowel/bladder control, toileting, cognition and psychosocial support 5. Can the patient actively participate in an intensive therapy program of at least 3 hrs of therapy per day at least 5 days per week? Yes 6. The potential for patient to make measurable gains while on inpatient rehab is excellent 7. Anticipated functional outcomes upon discharge from inpatient rehab are modified independent with PT, modified independent with OT, supervision with SLP. 8. Estimated rehab length of stay to reach the above functional goals is: 16-19 days. 9. Does the patient have adequate social supports and living environment to accommodate these discharge functional goals? Yes 10. Anticipated D/C setting: Home 11. Anticipated post D/C treatments: HH therapy and Home excercise program 12. Overall Rehab/Functional Prognosis: good  RECOMMENDATIONS: This patient's condition is appropriate for continued rehabilitative care in the following setting: CIR once medically stable (plan for troponins trending up) Patient has agreed to participate in recommended program. Potentially Note that insurance prior authorization may be required for reimbursement for recommended care.  Comment: Rehab Admissions Coordinator to follow up.  Andrea Lesch, MD 09/13/2015       Revision History     Date/Time User Provider Type Action   09/13/2015 4:38 PM Ankit Lorie Phenix, MD Physician Addend   09/13/2015 4:31 PM Ankit Lorie Phenix, MD Physician Sign   09/13/2015 8:19 AM Cathlyn Parsons, PA-C Physician Assistant [place]   View Details Report       Routing History     Date/Time From To Method   09/13/2015 4:38 PM Ankit Lorie Phenix, MD Ankit Lorie Phenix, MD In [facility]   09/13/2015 4:38 PM Ankit Lorie Phenix, MD Nolene Ebbs, MD Fax

## 2015-09-14 NOTE — H&P (View-Only) (Signed)
Physical Medicine and Rehabilitation Admission H&P    Chief Complaint  Patient presents with  . Fall  . Head Injury  : HPI: Andrea Keller is a 70 y.o. 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 Patient lives with spouse and assistance is needed. Independent prior to admission still driving. One level home with 4 steps to entry. Presented 09/10/2015 with persistent frontal headaches and generalized weakness as well as difficulty in concentrating. She did have a recent fall 09/09/2015. Her head on a dresser. CT of the head showed a 2.5 cm hemorrhage within the right frontal lobe with extensive white matter hypodensity throughout the right cerebral hemisphere. MRI completed showed right frontal lobe 5.3 x 3.3 cm hemorrhage with disproportionate marginal enhancement and mass effect/right frontoparietal subarachnoid hemorrhage. EEG negative seizure. CT of the chest negative. 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 Sabas Sous as an outpatient. Neurology consulted plan repeat MRI in 2-4 weeks for ongoing evaluation of either hemorrhagic metastasis or hemorrhagic infarct. Decadron protocol. Mildly elevated troponins with diffuse T-wave inversions likely due to [place]. Echocardiogram 09/13/2015 with ejection fraction of [id number] grade 1 diastolic dysfunction. No cardiac source of emboli. Tolerating a regular diet. Physical and Occupational therapy evaluations completed with recommendations of physical medicine rehabilitation consult.Patient was admitted for a comprehensive rehabilitation program  ROS Constitutional: Negative for fever and chills.  HENT: Negative for hearing loss.  Eyes: Negative for blurred vision and double vision.  Respiratory: Negative for  cough and shortness of breath.  Cardiovascular: Negative for chest pain, palpitations and leg swelling.  Gastrointestinal: Positive for constipation. Negative for nausea and vomiting.  Genitourinary: Negative for dysuria and hematuria.  Musculoskeletal: Positive for myalgias and falls.  Neurological: Positive for weakness. Negative for seizures and loss of consciousness.  Psychiatric/Behavioral: The patient has insomnia.  All other systems reviewed and are negative   Past Medical History  Diagnosis Date  . Diverticulosis   . Hyperplastic colon polyp   . Insomnia   . Microcytosis   . Osteoarthritis   . PVD (peripheral vascular disease) ([place])   . Multiple thyroid nodules   . Hypertension   . Goblet cell carcinoid ([place]) 11/08/2014   Past Surgical History  Procedure Laterality Date  . Cyst removal neck    . Abdominal hysterectomy  2009  . Laparoscopic appendectomy N/A 10/28/2014    Procedure: APPENDECTOMY LAPAROSCOPIC;  Surgeon: Erroll Luna, MD;  Location: [place];  Service: General;  Laterality: N/A;  . Appendectomy  10/28/2014   Family History  Problem Relation Age of Onset  . Colon cancer Brother   . Diabetes Mother   . Lung cancer Father   . Alcohol abuse     Social History:  reports that she has quit smoking. She has never used smokeless tobacco. She reports that she does not drink alcohol or use illicit drugs. Allergies:  Allergies  Allergen Reactions  . Latex Rash   Medications Prior to Admission  Medication Sig Dispense Refill  . acetaminophen (TYLENOL) 325 MG tablet Take 650 mg by mouth daily as needed for moderate pain.     Marland Kitchen amLODipine (NORVASC) 10 MG tablet Take 10 mg by mouth daily.    . ferrous sulfate 325 (65 FE) MG tablet Take 325 mg by mouth daily with breakfast.    .  rosuvastatin (CRESTOR) 10 MG tablet Take 10 mg by mouth daily.    . metroNIDAZOLE (FLAGYL) 500 MG tablet Take 1 tablet (500 mg total) by mouth 3 (three) times daily. 21 tablet 0  .  oxyCODONE-acetaminophen (PERCOCET/ROXICET) 5-325 MG per tablet Take 1-2 tablets by mouth every 4 (four) hours as needed for moderate pain. 30 tablet 0    Home: Home Living Family/patient expects to be discharged to:: Private residence Living Arrangements: Spouse/significant other, Children Available Help at Discharge: Family, Available 24 hours/day Type of Home: House Home Access: Stairs to enter [employer] of Steps: 4 Entrance Stairs-Rails: None Home Layout: One level Bathroom Shower/Tub: Gaffer, Door, Architectural technologist: Standard Bathroom Accessibility: Yes Home Equipment: Environmental consultant - 2 wheels  Lives With: Spouse, Son   Functional History: Prior Function Level of Independence: Independent Comments: still driving, Ind w/ all mobility and ADLs  Functional Status:  Mobility: Bed Mobility Overal bed mobility: Needs Assistance Bed Mobility: Supine to Sit Supine to sit: Min assist General bed mobility comments: tactile cues and minA to scoot hips to EOB Transfers Overall transfer level: Needs assistance Equipment used: None, 1 person hand held assist Transfers: Sit to/from Stand Sit to Stand: Min assist General transfer comment: slow to stand, v/c's for technique Ambulation/Gait Ambulation/Gait assistance: Min assist Ambulation Distance (Feet): 300 Feet Assistive device: None Gait Pattern/deviations: Decreased step length - right, Shuffle, Narrow base of support General Gait Details: extremely slow, guarded, trunk flexed holding hands near body. no episodes of LOB however pt unable to navigate around obstacles on the L, find her way back to her room, process multi-step commands to complete the DGI. pt unable to problem solve on how to navigate around obstacle once she ran into it Gait velocity interpretation: <1.8 ft/sec, indicative of risk for recurrent falls Stairs: Yes Stairs assistance: Min assist Stair Management: One rail Right, Step to pattern,  Forwards Number of Stairs: 4 General stair comments: Pt requires max verbal cues and tactile cues and took a few minutes to grasp task of descending the steps.  Min assist to steady.    ADL: ADL Overall ADL's : Needs assistance/impaired Eating/Feeding Details (indicate cue type and reason): will further assess Grooming: Minimal assistance Grooming Details (indicate cue type and reason): washed hands at sink and walked away without drying hands.Required mod vc to find paper towel holder Upper Body Bathing: Minimal assitance Lower Body Bathing: Moderate assistance Upper Body Dressing : Moderate assistance Lower Body Dressing: Moderate assistance Lower Body Dressing Details (indicate cue type and reason): perseerating on removing shoes. continued to try to put shoes back on while given command toremove shoes. Given command tohand therapist belt and pt continued to try to thread belt loop and was unable to terminate actio to hand therapist bilet. Required hand over hand to terminate activity. Functional mobility during ADLs: Minimal assistance General ADL Comments: Pt asked to get out of bed. Rail down on L and pt trying to get out on R side with rail. Pt requried mina to get out of bed on left. Pt unable to reproduce 2 step command of follow functional sequence. Pt asked to walk to nursing station to get cupof water. Pt instead walked over to other side of bed which was on her R side. Pt unable to find her way back to her room when she was standing at the end of the nursing station. when asked to find her room number, she found it in the writing of the "purrell" hand sanitizer  outside of her room.   Cognition: Cognition Overall Cognitive Status: Impaired/Different from baseline Arousal/Alertness: Awake/alert Orientation Level: Oriented X4 Cognition Arousal/Alertness: Awake/alert Behavior During Therapy: WFL for tasks assessed/performed Overall Cognitive Status: Impaired/Different from  baseline Area of Impairment: Safety/judgement, Awareness, Problem solving, Memory, Attention Orientation Level: Disoriented to, Time Current Attention Level: Selective Memory: Decreased short-term memory, Decreased recall of precautions Following Commands: Follows multi-step commands inconsistently, Follows one step commands with increased time, Follows one step commands inconsistently Safety/Judgement: Decreased awareness of safety, Decreased awareness of deficits (L sided neglect) Awareness: Emergent Problem Solving: Slow processing, Decreased initiation, Difficulty sequencing, Requires verbal cues, Requires tactile cues General Comments: pt reports she's at [place]. pt with significant L sided weakness and inability to find room, passed it 5 times requiring max verbal prompting to problem solve  Physical Exam: Blood pressure 155/68, pulse 48, temperature 97.9 F (36.6 C), temperature source Oral, resp. rate 20, height 5\' 3"  (1.6 m), weight 64.2 kg (141 lb 8.6 oz), SpO2 100 %. Physical Exam Constitutional: She is oriented to person, place, and time. She appears well-developed. 70 year old frail female  HENT: Normocephalic.  Right Ear: External ear normal.  Left Ear: External ear normal.  Eyes: Conjunctivae and EOM are normal.  Neck: Normal range of motion. Neck supple. No thyromegaly present.  Cardiovascular: Normal rate and regular rhythm.  Respiratory: Effort normal and breath sounds normal. No respiratory distress.  GI: Soft. Bowel sounds are normal. She exhibits no distension.  Musculoskeletal: She exhibits no edema or tenderness.  Neurological: She is alert and oriented to person, place, and time.  Follows commands Left inattention DTRs symmetric Sensation intact to light touch Motor: RUE 5/5 proximal to distal.  RLE: 4+/5 proximal to distal  LUE: 4/5 hip flexion, knee extension, 4+/5 ankle dorsi/plantarflexion   LLE: 4-/5 hip flexion, knee extension, 4/5 ankle  dorsi/plantarflexion Skin: Skin is warm and dry.  Psychiatric: She has a normal mood and affect. Her behavior is normal  Results for orders placed or performed during the hospital encounter of 09/10/15 (from the past 48 hour(s))  Troponin I     Status: Abnormal   Collection Time: 09/12/15 12:23 PM  Result Value Ref Range   Troponin I 0.10 (H) <0.031 ng/mL    Comment:        PERSISTENTLY INCREASED TROPONIN VALUES IN THE RANGE OF 0.04-0.49 ng/mL CAN BE SEEN IN:       -UNSTABLE ANGINA       -CONGESTIVE HEART FAILURE       -MYOCARDITIS       -CHEST TRAUMA       -ARRYHTHMIAS       -LATE PRESENTING MYOCARDIAL INFARCTION       -COPD   CLINICAL FOLLOW-UP RECOMMENDED.   Troponin I     Status: Abnormal   Collection Time: 09/12/15  5:59 PM  Result Value Ref Range   Troponin I 0.09 (H) <0.031 ng/mL    Comment:        PERSISTENTLY INCREASED TROPONIN VALUES IN THE RANGE OF 0.04-0.49 ng/mL CAN BE SEEN IN:       -UNSTABLE ANGINA       -CONGESTIVE HEART FAILURE       -MYOCARDITIS       -CHEST TRAUMA       -ARRYHTHMIAS       -LATE PRESENTING MYOCARDIAL INFARCTION       -COPD   CLINICAL FOLLOW-UP RECOMMENDED.   Troponin I     Status: Abnormal   Collection  Time: 09/13/15 12:22 AM  Result Value Ref Range   Troponin I 0.08 (H) <0.031 ng/mL    Comment:        PERSISTENTLY INCREASED TROPONIN VALUES IN THE RANGE OF 0.04-0.49 ng/mL CAN BE SEEN IN:       -UNSTABLE ANGINA       -CONGESTIVE HEART FAILURE       -MYOCARDITIS       -CHEST TRAUMA       -ARRYHTHMIAS       -LATE PRESENTING MYOCARDIAL INFARCTION       -COPD   CLINICAL FOLLOW-UP RECOMMENDED.   Troponin I     Status: Abnormal   Collection Time: 09/13/15  9:11 AM  Result Value Ref Range   Troponin I 0.10 (H) <0.031 ng/mL    Comment:        PERSISTENTLY INCREASED TROPONIN VALUES IN THE RANGE OF 0.04-0.49 ng/mL CAN BE SEEN IN:       -UNSTABLE ANGINA       -CONGESTIVE HEART FAILURE       -MYOCARDITIS       -CHEST TRAUMA        -ARRYHTHMIAS       -LATE PRESENTING MYOCARDIAL INFARCTION       -COPD   CLINICAL FOLLOW-UP RECOMMENDED.   Troponin I     Status: Abnormal   Collection Time: 09/13/15 12:35 PM  Result Value Ref Range   Troponin I 0.14 (H) <0.031 ng/mL    Comment:        PERSISTENTLY INCREASED TROPONIN VALUES IN THE RANGE OF 0.04-0.49 ng/mL CAN BE SEEN IN:       -UNSTABLE ANGINA       -CONGESTIVE HEART FAILURE       -MYOCARDITIS       -CHEST TRAUMA       -ARRYHTHMIAS       -LATE PRESENTING MYOCARDIAL INFARCTION       -COPD   CLINICAL FOLLOW-UP RECOMMENDED.   Troponin I     Status: Abnormal   Collection Time: 09/13/15  7:20 PM  Result Value Ref Range   Troponin I 0.11 (H) <0.031 ng/mL    Comment:        PERSISTENTLY INCREASED TROPONIN VALUES IN THE RANGE OF 0.04-0.49 ng/mL CAN BE SEEN IN:       -UNSTABLE ANGINA       -CONGESTIVE HEART FAILURE       -MYOCARDITIS       -CHEST TRAUMA       -ARRYHTHMIAS       -LATE PRESENTING MYOCARDIAL INFARCTION       -COPD   CLINICAL FOLLOW-UP RECOMMENDED.   Troponin I     Status: Abnormal   Collection Time: 09/13/15 11:52 PM  Result Value Ref Range   Troponin I 0.11 (H) <0.031 ng/mL    Comment:        PERSISTENTLY INCREASED TROPONIN VALUES IN THE RANGE OF 0.04-0.49 ng/mL CAN BE SEEN IN:       -UNSTABLE ANGINA       -CONGESTIVE HEART FAILURE       -MYOCARDITIS       -CHEST TRAUMA       -ARRYHTHMIAS       -LATE PRESENTING MYOCARDIAL INFARCTION       -COPD   CLINICAL FOLLOW-UP RECOMMENDED.   Troponin I     Status: Abnormal   Collection Time: 09/14/15  5:52 AM  Result Value Ref Range   Troponin I 0.10 (H) <0.031  ng/mL    Comment:        PERSISTENTLY INCREASED TROPONIN VALUES IN THE RANGE OF 0.04-0.49 ng/mL CAN BE SEEN IN:       -UNSTABLE ANGINA       -CONGESTIVE HEART FAILURE       -MYOCARDITIS       -CHEST TRAUMA       -ARRYHTHMIAS       -LATE PRESENTING MYOCARDIAL INFARCTION       -COPD   CLINICAL FOLLOW-UP RECOMMENDED.    No  results found.   Medical Problem List and Plan: 1.  Persistent frontal headaches generalized weakness with altered mental status secondary to traumatic ICH 2.  DVT Prophylaxis/Anticoagulation: SCDs. Monitor for any signs of DVT 3. Pain Management: Percocet as needed 4. 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 Follow-up outpatient interventional radiology Dr. Sabas Sous 5. Neuropsych: This patient is not capable of making decisions on her own behalf. 6. Skin/Wound Care: Routine skin checks 7. Fluids/Electrolytes/Nutrition: Routine eye and nose with follow-up chemistries 8. History of goblet cell carcinoid status post hemicolectomy. Follow-up [facility]. 9. Hyperlipidemia. Crestor 10. HTN: monitor and provide prns in accordance with increased physical exertion and pain 11. ABLA: Monitor and transfuse if necessary 12. Thrombocytopenia: Cont to follow platelets  Post Admission Physician Evaluation: 1. Functional deficits secondary  to traumatic ICH. 2. Patient is admitted to receive collaborative, interdisciplinary care between the physiatrist, rehab nursing staff, and therapy team. 3. Patient's level of medical complexity and substantial therapy needs in context of that medical necessity cannot be provided at a lesser intensity of care such as a SNF. 4. Patient has experienced substantial functional loss from his/her baseline which was documented above under the "Functional History" and "Functional Status" headings.  Judging by the patient's diagnosis, physical exam, and functional history, the patient has potential for functional progress which will result in measurable gains while on inpatient rehab.  These gains will be of substantial and practical use upon discharge  in facilitating mobility and self-care at the household level. 5. Physiatrist will provide 24 hour management of medical needs as well as oversight of the therapy plan/treatment and provide guidance as  appropriate regarding the interaction of the two. 6. 24 hour rehab nursing will assist with bladder management, safety, skin/wound care, disease management, medication administration and patient education and help integrate therapy concepts, techniques,education, etc. 7. PT will assess and treat for/with: Lower extremity strength, range of motion, stamina, balance, functional mobility, safety, adaptive techniques and equipment, coping skills, pain control, brain injury education.   Goals are: Mod I. 8. OT will assess and treat for/with: ADL's, functional mobility, safety, upper extremity strength, adaptive techniques and equipment, ego support, and community reintegration.   Goals are: Mod I. Therapy may proceed with showering this patient. 9. SLP will assess and treat for/with: cognition.  Goals are: Supervision. 10. Case Management and Social Worker will assess and treat for psychological issues and discharge planning. 11. Team conference will be held weekly to assess progress toward goals and to determine barriers to discharge. 12. Patient will receive at least 3 hours of therapy per day at least 5 days per week. 13. ELOS: 7-12 days.       14. Prognosis:  excellent   Delice Lesch, MD 09/14/2015

## 2015-09-14 NOTE — Progress Notes (Signed)
Patient to be transferred to the rehab, report called to the receiving nurse. Patient wheeled down to the room by staff.

## 2015-09-14 NOTE — Discharge Summary (Signed)
Physician Discharge Summary  Andrea Keller [id number] DOB: [birth year] DOA: 09/10/2015  PCP: Philis Fendt, MD  Admit date: 09/10/2015 Discharge date: 09/14/2015  Time spent: 30 minutes  Recommendations for Outpatient Follow-up:  1. Follow up in one month with Dr. Erlinda Hong at the stroke clinic - they will call you with appointment date and time 2. Follow up with Dr. Lindi Adie (oncology) at 3:45pm on 10/03/2015 3. Repeat MRI with contrast in 4 weeks after ICH resolution to rule out metastasis to brain 4. Will need 30 day cardiac event monitoring as outpt-please refer to cardiology 5. Please refer to interventional radiology - Dr. Sabas Sous - in the outpatient setting for evaluation of  LEFT paraclinoid aneurysm.  6. Follow up with your PCP for continued healthcare.  Discharge Diagnoses:  Principal Problem:   Intraparenchymal hemorrhage of brain (HCC) Active Problems:   Goblet cell carcinoid (HCC)   Microcytosis   Hypertension   Stroke (cerebrum) (HCC)   Essential hypertension   HLD (hyperlipidemia)   Malignant neoplasm of colon (HCC)   Acute blood loss anemia   Elevated troponin   Discharge Condition: Stable  Diet recommendation: Heart healthy.  Filed Weights   09/10/15 1617 09/12/15 2130  Weight: 55.792 kg (123 lb) 64.2 kg (141 lb 8.6 oz)    History of present illness:  Andrea Keller is a 70 y.o. female with PMH of hypertension, hyperlipidemia, goblet cell carcinoid (s/p of surgery), diverticulosis, insomnia, PVD, who presented to the ED on 09/10/2015 with a fall, HA and head injury. Patient does not have unilateral weakness, numbness or tingling sensations, no bleeding and no hearing loss. She denies chest pain, shortness breath, abdominal pain, diarrhea, symptoms of UTI. No fever or chills. In ED, patient was afebrile with a WBC 6.0, tachycardia, electrolytes okay, creatinine 1.03. CT-C-spines negative for bony fracture. CT head showed large ICH (see below). Patient was  admitted to inpatient for further evaluation and treatment. Neurology and neurosurgery were consulted by EDP.  Hospital Course:  Principal Problem:  Right frontal ICH: Non-focal exam except for some right facial droop and neglect from the left eye. Current working diagnoses of either a hemorrhagic metastasis or hemorrhagic infarct. MRI Brain on 09/10/2015 showed a subacute RIGHT frontal ICH with marginal enhancement, concerning for malignancy and bilateral punctate acute infarcts. Neurology recommends to repeat a MRI with contrast in  3-4 weeks after intracranial hemorrhage resolution to rule out brain metastases. 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 MRI Neck- no significant stenosis. Echo shows no embolic source. EEG- WNL. Patient was kept on decadron throughout her hospital stay and will need to be tapered off-based on findings on repeat MRI brain. Follow ups will include the stroke clinic and oncology for further workup and continued management.  Active Problems: Intermittent Confusion: Patient and daughter agree that the confusion has improved throughout her hospital stay. AxOx3. EEG WNL.No further work up recommended by Neurology  Hx of Goblet cell carcinoid of the appendix: s/p Laparoscopic right hemicolectomy, omentectomy, HIPEC with mitomycin C on 12/22/2014. Followed at [facility]. CT Chest 09/11/2015 negative for mets/malignancy. Most recent CT Abd 08/26/2015 at [facility] negative for malignancy as well (reviewed report in care everywhere). Patient has microcytic anemia-but colonoscopy on 2015 was negative. Repeat colonoscopy not necessary at this time (spoke with Dr Delrae Rend MD). Follow up with Oncology in the outpatient setting.  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 Sabas Sous as an outpatient. Better characterized on CTA head on  a nonemergent basis.  Minimally elevated troponins: Diffuse T wave inversions are likely due to [place]. Troponin trend is flat and  not consistent with ACS. Echocardiogram showed no embolic source. LVEF: 55-65%, no regional wall motion abnormalities.Given ICH-unable to use anti-platelets or pursue further work up.   AKI: Resolved.  Microcytic Anemia: Iron panel suggestive of anemia of chronic disease. Followup with PCP.   Hypertension: Permissive HTN. Dose of amlodipine lowered while inpatient - continue amlodipine 5mg  daily.  Hyperlipidemia: Controlled. Continue statin.   Procedures:  EEG 09/12/2015  Echocardiogram 09/13/2015  Consultations:  Neurology  Neurosurgery  Discharge Exam: Filed Vitals:   09/14/15 0527 09/14/15 1013  BP: 155/68 158/65  Pulse: 48 54  Temp: 97.9 F (36.6 C) 98.1 F (36.7 C)  Resp: 20 18     General: Alert, oriented x 3. Clear speech. Pleasant and conversational.  Cardiovascular: S1, S2 normal. No M/R/C/G. No edema.  Respiratory: CTABL. No adventitious breath sounds.  Neuro: Appears to have left inattention. Minor right-sided facial droop, but can wrinkle forehead symetrically. DTRs 3+ throughout. Follows commands appropriately.  Discharge Instructions   Discharge Instructions    Ambulatory referral to Neurology    Complete by:  As directed   Dr. Erlinda Hong requests followup in 1 month     Call MD for:  difficulty breathing, headache or visual disturbances    Complete by:  As directed      Call MD for:  persistant dizziness or light-headedness    Complete by:  As directed      Call MD for:  persistant nausea and vomiting    Complete by:  As directed      Diet - low sodium heart healthy    Complete by:  As directed      Increase activity slowly    Complete by:  As directed           Current Discharge Medication List    START taking these medications   Details  dexamethasone (DECADRON) 6 MG tablet Take 1 tablet (6 mg total) by mouth every 8 (eight) hours. Qty: 90 tablet, Refills: 0    pantoprazole (PROTONIX) 40 MG tablet Take 1 tablet (40 mg total) by mouth  daily at 12 noon. Qty: 30 tablet, Refills: 0      CONTINUE these medications which have CHANGED   Details  amLODipine (NORVASC) 10 MG tablet Take 0.5 tablets (5 mg total) by mouth daily. Qty: 30 tablet, Refills: 0    oxyCODONE-acetaminophen (PERCOCET/ROXICET) 5-325 MG tablet Take 1-2 tablets by mouth every 6 (six) hours as needed for moderate pain. Qty: 30 tablet, Refills: 0      CONTINUE these medications which have NOT CHANGED   Details  acetaminophen (TYLENOL) 325 MG tablet Take 650 mg by mouth daily as needed for moderate pain.     ferrous sulfate 325 (65 FE) MG tablet Take 325 mg by mouth daily with breakfast.    rosuvastatin (CRESTOR) 10 MG tablet Take 10 mg by mouth daily.      STOP taking these medications     metroNIDAZOLE (FLAGYL) 500 MG tablet        Allergies  Allergen Reactions  . Latex Rash   Follow-up Information    Follow up with Xu,Jindong, MD In 1 month.   Specialty:  Neurology   Why:  Stroke Clinic, Office will call you with appointment date & time   Contact information:   [address] [place] [address] [phone]  Follow up with Rulon Eisenmenger, MD On 10/03/2015.   Specialty:  Hematology and Oncology   Why:   appointment at 3:45 (with the oncologist)   Contact information:   [place] [address] [phone]       Follow up with Philis Fendt, MD In 1 week.   Specialty:  Internal Medicine   Contact information:   [place]  [id number] [phone]       The results of significant diagnostics from this hospitalization (including imaging, microbiology, ancillary and laboratory) are listed below for reference.    Significant Diagnostic Studies: Ct Head Wo Contrast  09/10/2015  ADDENDUM REPORT: 09/10/2015 20:50 ADDENDUM: The PA taking care the patient returned my call. Results were given verbally on 09/10/2015 at 8:49 pm to [facility] , who verbally acknowledged these  results. Electronically Signed   By: Rolm Baptise M.D.   On: 09/10/2015 20:50  09/10/2015  CLINICAL DATA:  Fall last night, head injury. EXAM: CT HEAD WITHOUT CONTRAST CT CERVICAL SPINE WITHOUT CONTRAST TECHNIQUE: Multidetector CT imaging of the head and cervical spine was performed following the standard protocol without intravenous contrast. Multiplanar CT image reconstructions of the cervical spine were also generated. COMPARISON:  None. FINDINGS: CT HEAD FINDINGS There is an area of hemorrhage within the right frontal lobe measuring up to 2.5 cm. There is extensive subcortical and deep white matter hypodensity throughout the right cerebral hemisphere, which appears to be out of proportion to the bleed. Also, some areas of hypodensity are remote to the bleed. I cannot exclude underlying mass lesions within the right cerebral hemisphere. No significant midline shift. No hydrocephalus. No acute calvarial abnormality or scalp swelling. Visualized paranasal sinuses and mastoids clear. Orbital soft tissues unremarkable. CT CERVICAL SPINE FINDINGS Alignment is normal. Prevertebral soft tissues are normal. Early degenerative spurring and disc space narrowing. Mild degenerative facet disease diffusely. No fracture. No epidural or paraspinal hematoma. No acute spreads that IMPRESSION: 2.5 cm hemorrhage within the right frontal lobe with extensive white matter hypodensity throughout the right cerebral hemisphere. While this could be posttraumatic, given the degree of hypodensity throughout the right cerebral hemisphere, I cannot exclude underlying brain lesions. Recommend further evaluation with MRI with and without contrast. No acute bony abnormality in the cervical spine. Attempted to call the Dr. Lillette Boxer care of this patient. She instructed me that she would have to call me back shortly. Electronically Signed: By: Rolm Baptise M.D. On: 09/10/2015 20:28   Ct Chest W Contrast  09/11/2015  CLINICAL DATA:  Carcinoid.   Intracranial hemorrhage. EXAM: CT CHEST WITH CONTRAST TECHNIQUE: Multidetector CT imaging of the chest was performed during intravenous contrast administration. CONTRAST:  35mL OMNIPAQUE IOHEXOL 300 MG/ML  SOLN COMPARISON:  None. FINDINGS: THORACIC INLET/BODY WALL: Partly visible volume loss in the submandibular right neck which appears postsurgical. Bilateral thyroid nodules, up to 15 mm on the right, likely incidental. No adenopathy. MEDIASTINUM: Normal heart size. Small low-density pericardial effusion. No acute vascular abnormality. No adenopathy. LUNG WINDOWS: Mild dependent atelectasis. There is no edema, consolidation, effusion, or pneumothorax. No suspicious pulmonary nodule. UPPER ABDOMEN: Partly visible central hepatic cyst. Patchy heterogeneous enhancement of the spleen is likely physiologic (in the abdomen this is arterial phase) OSSEOUS: No acute fracture.  No suspicious lytic or blastic lesions. IMPRESSION: No primary malignancy or metastatic disease identified in the chest. Electronically Signed   By: Monte Fantasia M.D.   On: 09/11/2015 01:14   Ct Cervical Spine Wo  Contrast  09/10/2015  ADDENDUM REPORT: 09/10/2015 20:50 ADDENDUM: The PA taking care the patient returned my call. Results were given verbally on 09/10/2015 at 8:49 pm to [facility] , who verbally acknowledged these results. Electronically Signed   By: Rolm Baptise M.D.   On: 09/10/2015 20:50  09/10/2015  CLINICAL DATA:  Fall last night, head injury. EXAM: CT HEAD WITHOUT CONTRAST CT CERVICAL SPINE WITHOUT CONTRAST TECHNIQUE: Multidetector CT imaging of the head and cervical spine was performed following the standard protocol without intravenous contrast. Multiplanar CT image reconstructions of the cervical spine were also generated. COMPARISON:  None. FINDINGS: CT HEAD FINDINGS There is an area of hemorrhage within the right frontal lobe measuring up to 2.5 cm. There is extensive subcortical and deep white matter hypodensity  throughout the right cerebral hemisphere, which appears to be out of proportion to the bleed. Also, some areas of hypodensity are remote to the bleed. I cannot exclude underlying mass lesions within the right cerebral hemisphere. No significant midline shift. No hydrocephalus. No acute calvarial abnormality or scalp swelling. Visualized paranasal sinuses and mastoids clear. Orbital soft tissues unremarkable. CT CERVICAL SPINE FINDINGS Alignment is normal. Prevertebral soft tissues are normal. Early degenerative spurring and disc space narrowing. Mild degenerative facet disease diffusely. No fracture. No epidural or paraspinal hematoma. No acute spreads that IMPRESSION: 2.5 cm hemorrhage within the right frontal lobe with extensive white matter hypodensity throughout the right cerebral hemisphere. While this could be posttraumatic, given the degree of hypodensity throughout the right cerebral hemisphere, I cannot exclude underlying brain lesions. Recommend further evaluation with MRI with and without contrast. No acute bony abnormality in the cervical spine. Attempted to call the Dr. Lillette Boxer care of this patient. She instructed me that she would have to call me back shortly. Electronically Signed: By: Rolm Baptise M.D. On: 09/10/2015 20:28   Mr Angiogram Neck W Wo Contrast  09/12/2015  CLINICAL DATA:  Acute onset headache, memory difficulties, 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 History of cancer. EXAM: MRA NECK WITHOUT AND WITH CONTRAST MRA HEAD WITHOUT CONTRAST TECHNIQUE: Multiplanar and multiecho pulse sequences of the neck were obtained without and with intravenous contrast. Angiographic images of the neck were obtained using MRA technique without and with intravenous contrast.; Angiographic images of the Circle of Willis were obtained using MRA technique without intravenous contrast. CONTRAST:  20mL MULTIHANCE GADOBENATE DIMEGLUMINE 529 MG/ML IV SOLN COMPARISON:  MRI of the brain September 10, 2015  FINDINGS: MRA HEAD FINDINGS Mildly motion degraded examination. Anterior circulation: Flow related enhancement within the cervical, petrous, cavernous and supraclinoid internal carotid arteries, with focally ectatic bilateral carotid terminus, at posterior communicating artery origin. The anterior and middle cerebral arteries are patent. Mild luminal regularity of the middle cerebral arteries. No large vessel occlusion, high-grade stenosis. Posterior circulation: RIGHT vertebral artery is dominant. Basilar artery is patent, with normal flow related enhancement of the main branch vessels. Flow related enhancement of the posterior cerebral arteries with mild luminal regularity. No large vessel occlusion, high-grade stenosis, aneurysm. Evolving RIGHT frontal lobe hematoma with marginal T1 shortening compatible with subacute blood products. Small amount of subacute RIGHT cerebrum subarachnoid hemorrhage partially imaged. MRA NECK FINDINGS Source images and MIP image were reviewed. The common carotid arteries are widely patent bilaterally. The carotid bifurcation is patent bilaterally and there is no significant carotid stenosis. Both vertebral arteries and patent to the basilar without significant vertebral stenosis. There is no evidence for atherosclerosis or flow limiting stenosis. IMPRESSION: MRA HEAD: Focally  ectatic bilateral carotid termini, in part related to posterior communicating artery infundibulum. No emergent large vessel occlusion or acute vascular process. Mild luminal irregularity of cerebral arteries compatible with atherosclerosis. MRA NECK:  Negative. Electronically Signed   By: Elon Alas M.D.   On: 09/12/2015 02:33   Mr Jeri Cos [id number] Contrast  09/10/2015  CLINICAL DATA:  [place] yesterday, striking head on dresser. Progressive weakness. Follow-up intracranial hemorrhage. History of carcinoid tumor. EXAM: MRI HEAD WITHOUT AND WITH CONTRAST TECHNIQUE: Multiplanar, multiecho pulse sequences of the  brain and surrounding structures were obtained without and with intravenous contrast. CONTRAST:  62mL MULTIHANCE GADOBENATE DIMEGLUMINE 529 MG/ML IV SOLN COMPARISON:  CT head September 10, 2015 FINDINGS: RIGHT frontal intraparenchymal hemorrhage with peripheral T1 shortening and extensive surrounding T2 bright edema. Superimposed smooth rind of peripheral enhancement, in total measuring 5.3 x 3.3 cm. RIGHT frontal local mass effect, resulting in 2 mm RIGHT to LEFT midline shift. Mass effect on RIGHT frontal horn of lateral ventricle without hydrocephalus or entrapment. Susceptibility artifact within the RIGHT frontoparietal subarachnoid spaces. At least 2 subcentimeter foci of reduced diffusion LEFT cerebellum, in addition to subcentimeter focus of reduced diffusion bilateral occipital lobes, bilateral parietal lobes and, LEFT frontal lobe. 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 RIGHT parietal occipital lobe encephalomalacia with intrinsic T1 shortening consistent with laminar necrosis. Patchy to confluent additional supratentorial white matter FLAIR T2 hyperintensities compatible with subarachnoid hemorrhage. 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 Major intracranial vascular flow voids present at skull base. Ocular globes and orbital contents are unremarkable. Fluid-filled expanded empty sella. No cerebellar tonsillar ectopia. No suspicious calvarial bone marrow signal. IMPRESSION: Subacute RIGHT frontal lobe 5.3 x 3.3 cm hemorrhage, with disproportionate marginal enhancement and mass effect, concerning for underlying mass though not distinctly demonstrated. Recommend 10-14 day follow-up MRI of the brain with contrast. RIGHT frontoparietal subarachnoid hemorrhage. Multiple subcentimeter foci of acute ischemia (less likely nonenhancing metastasis) within the supra and infratentorial brain, predominately in posterior circulation favoring embolic phenomena. In addition, subacute to old RIGHT parietal occipital lobe  infarct. 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 Electronically Signed   By: Elon Alas M.D.   On: 09/10/2015 22:53   Mr Jodene Nam Head/brain Wo Cm  09/12/2015  CLINICAL DATA:  Acute onset headache, memory difficulties, 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 History of cancer. EXAM: MRA NECK WITHOUT AND WITH CONTRAST MRA HEAD WITHOUT CONTRAST TECHNIQUE: Multiplanar and multiecho pulse sequences of the neck were obtained without and with intravenous contrast. Angiographic images of the neck were obtained using MRA technique without and with intravenous contrast.; Angiographic images of the Circle of Willis were obtained using MRA technique without intravenous contrast. CONTRAST:  37mL MULTIHANCE GADOBENATE DIMEGLUMINE 529 MG/ML IV SOLN COMPARISON:  MRI of the brain September 10, 2015 FINDINGS: MRA HEAD FINDINGS Mildly motion degraded examination. Anterior circulation: Flow related enhancement within the cervical, petrous, cavernous and supraclinoid internal carotid arteries, with focally ectatic bilateral carotid terminus, at posterior communicating artery origin. The anterior and middle cerebral arteries are patent. Mild luminal regularity of the middle cerebral arteries. No large vessel occlusion, high-grade stenosis. Posterior circulation: RIGHT vertebral artery is dominant. Basilar artery is patent, with normal flow related enhancement of the main branch vessels. Flow related enhancement of the posterior cerebral arteries with mild luminal regularity. No large vessel occlusion, high-grade stenosis, aneurysm. Evolving RIGHT frontal lobe hematoma with marginal T1 shortening compatible with subacute blood products. Small  amount of subacute RIGHT cerebrum subarachnoid hemorrhage partially imaged. MRA NECK FINDINGS Source images and MIP image were reviewed. The common carotid arteries are widely patent bilaterally. The carotid bifurcation is patent  bilaterally and there is no significant carotid stenosis. Both vertebral arteries and patent to the basilar without significant vertebral stenosis. There is no evidence for atherosclerosis or flow limiting stenosis. IMPRESSION: MRA HEAD: Focally ectatic bilateral carotid termini, in part related to posterior communicating artery infundibulum. No emergent large vessel occlusion or acute vascular process. Mild luminal irregularity of cerebral arteries compatible with atherosclerosis. MRA NECK:  Negative. Electronically Signed   By: Elon Alas M.D.   On: 09/12/2015 02:33    Microbiology: Recent Results (from the past 240 hour(s))  MRSA PCR Screening     Status: None   Collection Time: 09/11/15  2:18 PM  Result Value Ref Range Status   MRSA by PCR NEGATIVE NEGATIVE Final    Comment:        The GeneXpert MRSA Assay (FDA approved for NASAL specimens only), is one component of a comprehensive MRSA colonization surveillance program. It is not intended to diagnose MRSA infection nor to guide or monitor treatment for MRSA infections.     Labs: Basic Metabolic Panel:  Recent Labs Lab 09/10/15 2022 09/11/15 0827  NA 140 140  K 4.8 4.5  CL 103 106  CO2 26 24  GLUCOSE 113* 127*  BUN 15 11  CREATININE 1.03* 0.93  CALCIUM 9.4 9.3   Liver Function Tests:  Recent Labs Lab 09/10/15 2022  AST 21  ALT 15  ALKPHOS 75  BILITOT 0.3  PROT 7.0  ALBUMIN 3.5   No results for input(s): LIPASE, AMYLASE in the last 168 hours. No results for input(s): AMMONIA in the last 168 hours. CBC:  Recent Labs Lab 09/10/15 2022 09/12/15 0706  WBC 6.0 7.8  NEUTROABS 2.7  --   HGB 10.7* 10.7*  HCT 33.1* 34.1*  MCV 74.5* 73.7*  PLT 154 142*   Cardiac Enzymes:  Recent Labs Lab 09/13/15 0911 09/13/15 1235 09/13/15 1920 09/13/15 2352 09/14/15 0552  TROPONINI 0.10* 0.14* 0.11* 0.11* 0.10*   BNP: BNP (last 3 results) No results for input(s): BNP in the last 8760 hours.  ProBNP  (last 3 results) No results for input(s): PROBNP in the last 8760 hours.  CBG: No results for input(s): GLUCAP in the last 168 hours.  SignedOren Binet MD [facility] 09/14/2015, 11:35 AM

## 2015-09-14 NOTE — Progress Notes (Addendum)
Occupational Therapy Treatment Patient Details Name: Andrea Keller MRN: [id number] DOB: 12-Jan-1946 Today's Date: 09/14/2015    History of present illness Pt is a 70 y.o. F who presented w/ one week of headache, difficulty w/ memory, trouble concentreating.  She fell, which prompted her to come to the ED.  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  Plan is to repeat MRI in 2-4 weeks after ICH resolution to rule out metastasis to the brain. Pt's PMH inlcudes diverticulosis, insomnia, PVD, goblet cell carcinoid.     OT comments  Pt progressing. Will continue to follow acutely.    Follow Up Recommendations  CIR;Supervision/Assistance - 24 hour    Equipment Recommendations  3 in 1 bedside comode    Recommendations for Other Services Rehab consult    Precautions / Restrictions Precautions Precautions: Fall; per nurse, pt not on bed rest Restrictions Weight Bearing Restrictions: No       Mobility Bed Mobility Overal bed mobility: Needs Assistance Bed Mobility: Supine to Sit;Sit to Supine     Supine to sit: Modified independent (Device/Increase time) Sit to supine:  (see comments)   General bed mobility comments: pt able to go from sit to supine with Mod I, but assist given to scoot to [facility].  Transfers Overall transfer level: Needs assistance   Transfers: Sit to/from Stand Sit to Stand: Supervision              Balance    Min guard for ambulation.                               ADL Overall ADL's : Needs assistance/impaired     Grooming: Oral care;Standing;Set up;Supervision/safety (also wiped off hand)                   Toilet Transfer: Min guard;Ambulation (sit to stand from bed)   Toileting- Clothing Manipulation and Hygiene: Minimal assistance (standing) Toileting - Clothing Manipulation Details (indicate cue type and reason): pt  pulled down underwear part way to wash peri area and bottom; also dryed off peri area; assist to pull up left side of underwear     Functional mobility during ADLs: Min guard General ADL Comments: Cues to attend to left.  Pt seemed to have slow processing.       Vision                     Perception     Praxis      Cognition  Awake/Alert Behavior During Therapy: WFL for tasks assessed/performed Overall Cognitive Status:  (unsure of baseline) Area of Impairment: Following commands;Problem solving        Following Commands: Follows one step commands inconsistently     Problem Solving: Slow processing;Requires verbal cues General Comments: cues given to attend to left    Extremity/Trunk Assessment               Exercises     Shoulder Instructions       General Comments      Pertinent Vitals/ Pain       Pain Assessment: No/denies pain  Home Living  Prior Functioning/Environment              Frequency Min 2X/week     Progress Toward Goals  OT Goals(current goals can now be found in the care plan section)  Progress towards OT goals: Progressing toward goals  Acute Rehab OT Goals Patient Stated Goal: not stated OT Goal Formulation: With patient/family Time For Goal Achievement: 09/26/15 Potential to Achieve Goals: Good ADL Goals Pt Will Perform Grooming: with supervision;standing Pt Will Transfer to Toilet: with supervision;ambulating Pt Will Perform Toileting - Clothing Manipulation and hygiene: with supervision;sit to/from stand Additional ADL Goal #1: Pt will complete trail following task to room with min vc Additional ADL Goal #2: Pt will consistently locate room numbers on L with min vc to increase visual scanning and awareness to L field  Plan Discharge plan remains appropriate    Co-evaluation                 End of Session Equipment Utilized During Treatment:  Gait belt   Activity Tolerance Patient tolerated treatment well   Patient Left in bed;with family/visitor present;with bed alarm set;Other (comment) (rehab coordinator in room)   Nurse Communication          Time: [phone] OT Time Calculation (min): 22 min  Charges: OT General Charges $OT Visit: 1 Procedure OT Treatments $Self Care/Home Management : 8-22 mins   Benito Mccreedy OTR/L [id number] 09/14/2015, 11:44 AM

## 2015-09-14 NOTE — Progress Notes (Signed)
PT Cancellation Note  Patient Details Name: Andrea Keller MRN: [id number] DOB: May 06, 1946   Cancelled Treatment:    Reason Eval/Treat Not Completed: Patient at procedure or test/unavailable. Patient initially with OT, then with Rehab Case Manager, and now eating lunch.   Sammy Douthitt 09/14/2015, 11:43 AM Pager [phone]

## 2015-09-14 NOTE — H&P (Addendum)
Physical Medicine and Rehabilitation Admission H&P    Chief Complaint  Patient presents with  . Fall  . Head Injury  : HPI: Andrea Keller is a 70 y.o. 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 Patient lives with spouse and assistance is needed. Independent prior to admission still driving. One level home with 4 steps to entry. Presented 09/10/2015 with persistent frontal headaches and generalized weakness as well as difficulty in concentrating. She did have a recent fall 09/09/2015. Her head on a dresser. CT of the head showed a 2.5 cm hemorrhage within the right frontal lobe with extensive white matter hypodensity throughout the right cerebral hemisphere. MRI completed showed right frontal lobe 5.3 x 3.3 cm hemorrhage with disproportionate marginal enhancement and mass effect/right frontoparietal subarachnoid hemorrhage. EEG negative seizure. CT of the chest negative. 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 Sabas Sous as an outpatient. Neurology consulted plan repeat MRI in 2-4 weeks for ongoing evaluation of either hemorrhagic metastasis or hemorrhagic infarct. Decadron protocol. Mildly elevated troponins with diffuse T-wave inversions likely due to [place]. Echocardiogram 09/13/2015 with ejection fraction of [id number] grade 1 diastolic dysfunction. No cardiac source of emboli. Tolerating a regular diet. Physical and Occupational therapy evaluations completed with recommendations of physical medicine rehabilitation consult.Patient was admitted for a comprehensive rehabilitation program  ROS Constitutional: Negative for fever and chills.  HENT: Negative for hearing loss.  Eyes: Negative for blurred vision and double vision.  Respiratory: Negative for  cough and shortness of breath.  Cardiovascular: Negative for chest pain, palpitations and leg swelling.  Gastrointestinal: Positive for constipation. Negative for nausea and vomiting.  Genitourinary: Negative for dysuria and hematuria.  Musculoskeletal: Positive for myalgias and falls.  Neurological: Positive for weakness. Negative for seizures and loss of consciousness.  Psychiatric/Behavioral: The patient has insomnia.  All other systems reviewed and are negative   Past Medical History  Diagnosis Date  . Diverticulosis   . Hyperplastic colon polyp   . Insomnia   . Microcytosis   . Osteoarthritis   . PVD (peripheral vascular disease) ([place])   . Multiple thyroid nodules   . Hypertension   . Goblet cell carcinoid ([place]) 11/08/2014   Past Surgical History  Procedure Laterality Date  . Cyst removal neck    . Abdominal hysterectomy  2009  . Laparoscopic appendectomy N/A 10/28/2014    Procedure: APPENDECTOMY LAPAROSCOPIC;  Surgeon: Erroll Luna, MD;  Location: [place];  Service: General;  Laterality: N/A;  . Appendectomy  10/28/2014   Family History  Problem Relation Age of Onset  . Colon cancer Brother   . Diabetes Mother   . Lung cancer Father   . Alcohol abuse     Social History:  reports that she has quit smoking. She has never used smokeless tobacco. She reports that she does not drink alcohol or use illicit drugs. Allergies:  Allergies  Allergen Reactions  . Latex Rash   Medications Prior to Admission  Medication Sig Dispense Refill  . acetaminophen (TYLENOL) 325 MG tablet Take 650 mg by mouth daily as needed for moderate pain.     Marland Kitchen amLODipine (NORVASC) 10 MG tablet Take 10 mg by mouth daily.    . ferrous sulfate 325 (65 FE) MG tablet Take 325 mg by mouth daily with breakfast.    .  rosuvastatin (CRESTOR) 10 MG tablet Take 10 mg by mouth daily.    . metroNIDAZOLE (FLAGYL) 500 MG tablet Take 1 tablet (500 mg total) by mouth 3 (three) times daily. 21 tablet 0  .  oxyCODONE-acetaminophen (PERCOCET/ROXICET) 5-325 MG per tablet Take 1-2 tablets by mouth every 4 (four) hours as needed for moderate pain. 30 tablet 0    Home: Home Living Family/patient expects to be discharged to:: Private residence Living Arrangements: Spouse/significant other, Children Available Help at Discharge: Family, Available 24 hours/day Type of Home: House Home Access: Stairs to enter [employer] of Steps: 4 Entrance Stairs-Rails: None Home Layout: One level Bathroom Shower/Tub: Gaffer, Door, Architectural technologist: Standard Bathroom Accessibility: Yes Home Equipment: Environmental consultant - 2 wheels  Lives With: Spouse, Son   Functional History: Prior Function Level of Independence: Independent Comments: still driving, Ind w/ all mobility and ADLs  Functional Status:  Mobility: Bed Mobility Overal bed mobility: Needs Assistance Bed Mobility: Supine to Sit Supine to sit: Min assist General bed mobility comments: tactile cues and minA to scoot hips to EOB Transfers Overall transfer level: Needs assistance Equipment used: None, 1 person hand held assist Transfers: Sit to/from Stand Sit to Stand: Min assist General transfer comment: slow to stand, v/c's for technique Ambulation/Gait Ambulation/Gait assistance: Min assist Ambulation Distance (Feet): 300 Feet Assistive device: None Gait Pattern/deviations: Decreased step length - right, Shuffle, Narrow base of support General Gait Details: extremely slow, guarded, trunk flexed holding hands near body. no episodes of LOB however pt unable to navigate around obstacles on the L, find her way back to her room, process multi-step commands to complete the DGI. pt unable to problem solve on how to navigate around obstacle once she ran into it Gait velocity interpretation: <1.8 ft/sec, indicative of risk for recurrent falls Stairs: Yes Stairs assistance: Min assist Stair Management: One rail Right, Step to pattern,  Forwards Number of Stairs: 4 General stair comments: Pt requires max verbal cues and tactile cues and took a few minutes to grasp task of descending the steps.  Min assist to steady.    ADL: ADL Overall ADL's : Needs assistance/impaired Eating/Feeding Details (indicate cue type and reason): will further assess Grooming: Minimal assistance Grooming Details (indicate cue type and reason): washed hands at sink and walked away without drying hands.Required mod vc to find paper towel holder Upper Body Bathing: Minimal assitance Lower Body Bathing: Moderate assistance Upper Body Dressing : Moderate assistance Lower Body Dressing: Moderate assistance Lower Body Dressing Details (indicate cue type and reason): perseerating on removing shoes. continued to try to put shoes back on while given command toremove shoes. Given command tohand therapist belt and pt continued to try to thread belt loop and was unable to terminate actio to hand therapist bilet. Required hand over hand to terminate activity. Functional mobility during ADLs: Minimal assistance General ADL Comments: Pt asked to get out of bed. Rail down on L and pt trying to get out on R side with rail. Pt requried mina to get out of bed on left. Pt unable to reproduce 2 step command of follow functional sequence. Pt asked to walk to nursing station to get cupof water. Pt instead walked over to other side of bed which was on her R side. Pt unable to find her way back to her room when she was standing at the end of the nursing station. when asked to find her room number, she found it in the writing of the "purrell" hand sanitizer  outside of her room.   Cognition: Cognition Overall Cognitive Status: Impaired/Different from baseline Arousal/Alertness: Awake/alert Orientation Level: Oriented X4 Cognition Arousal/Alertness: Awake/alert Behavior During Therapy: WFL for tasks assessed/performed Overall Cognitive Status: Impaired/Different from  baseline Area of Impairment: Safety/judgement, Awareness, Problem solving, Memory, Attention Orientation Level: Disoriented to, Time Current Attention Level: Selective Memory: Decreased short-term memory, Decreased recall of precautions Following Commands: Follows multi-step commands inconsistently, Follows one step commands with increased time, Follows one step commands inconsistently Safety/Judgement: Decreased awareness of safety, Decreased awareness of deficits (L sided neglect) Awareness: Emergent Problem Solving: Slow processing, Decreased initiation, Difficulty sequencing, Requires verbal cues, Requires tactile cues General Comments: pt reports she's at [place]. pt with significant L sided weakness and inability to find room, passed it 5 times requiring max verbal prompting to problem solve  Physical Exam: Blood pressure 155/68, pulse 48, temperature 97.9 F (36.6 C), temperature source Oral, resp. rate 20, height 5\' 3"  (1.6 m), weight 64.2 kg (141 lb 8.6 oz), SpO2 100 %. Physical Exam Constitutional: She is oriented to person, place, and time. She appears well-developed. 70 year old frail female  HENT: Normocephalic.  Right Ear: External ear normal.  Left Ear: External ear normal.  Eyes: Conjunctivae and EOM are normal.  Neck: Normal range of motion. Neck supple. No thyromegaly present.  Cardiovascular: Normal rate and regular rhythm.  Respiratory: Effort normal and breath sounds normal. No respiratory distress.  GI: Soft. Bowel sounds are normal. She exhibits no distension.  Musculoskeletal: She exhibits no edema or tenderness.  Neurological: She is alert and oriented to person, place, and time.  Follows commands Left inattention DTRs symmetric Sensation intact to light touch Motor: RUE 5/5 proximal to distal.  RLE: 4+/5 proximal to distal  LUE: 4/5 hip flexion, knee extension, 4+/5 ankle dorsi/plantarflexion   LLE: 4-/5 hip flexion, knee extension, 4/5 ankle  dorsi/plantarflexion Skin: Skin is warm and dry.  Psychiatric: She has a normal mood and affect. Her behavior is normal  Results for orders placed or performed during the hospital encounter of 09/10/15 (from the past 48 hour(s))  Troponin I     Status: Abnormal   Collection Time: 09/12/15 12:23 PM  Result Value Ref Range   Troponin I 0.10 (H) <0.031 ng/mL    Comment:        PERSISTENTLY INCREASED TROPONIN VALUES IN THE RANGE OF 0.04-0.49 ng/mL CAN BE SEEN IN:       -UNSTABLE ANGINA       -CONGESTIVE HEART FAILURE       -MYOCARDITIS       -CHEST TRAUMA       -ARRYHTHMIAS       -LATE PRESENTING MYOCARDIAL INFARCTION       -COPD   CLINICAL FOLLOW-UP RECOMMENDED.   Troponin I     Status: Abnormal   Collection Time: 09/12/15  5:59 PM  Result Value Ref Range   Troponin I 0.09 (H) <0.031 ng/mL    Comment:        PERSISTENTLY INCREASED TROPONIN VALUES IN THE RANGE OF 0.04-0.49 ng/mL CAN BE SEEN IN:       -UNSTABLE ANGINA       -CONGESTIVE HEART FAILURE       -MYOCARDITIS       -CHEST TRAUMA       -ARRYHTHMIAS       -LATE PRESENTING MYOCARDIAL INFARCTION       -COPD   CLINICAL FOLLOW-UP RECOMMENDED.   Troponin I     Status: Abnormal   Collection  Time: 09/13/15 12:22 AM  Result Value Ref Range   Troponin I 0.08 (H) <0.031 ng/mL    Comment:        PERSISTENTLY INCREASED TROPONIN VALUES IN THE RANGE OF 0.04-0.49 ng/mL CAN BE SEEN IN:       -UNSTABLE ANGINA       -CONGESTIVE HEART FAILURE       -MYOCARDITIS       -CHEST TRAUMA       -ARRYHTHMIAS       -LATE PRESENTING MYOCARDIAL INFARCTION       -COPD   CLINICAL FOLLOW-UP RECOMMENDED.   Troponin I     Status: Abnormal   Collection Time: 09/13/15  9:11 AM  Result Value Ref Range   Troponin I 0.10 (H) <0.031 ng/mL    Comment:        PERSISTENTLY INCREASED TROPONIN VALUES IN THE RANGE OF 0.04-0.49 ng/mL CAN BE SEEN IN:       -UNSTABLE ANGINA       -CONGESTIVE HEART FAILURE       -MYOCARDITIS       -CHEST TRAUMA        -ARRYHTHMIAS       -LATE PRESENTING MYOCARDIAL INFARCTION       -COPD   CLINICAL FOLLOW-UP RECOMMENDED.   Troponin I     Status: Abnormal   Collection Time: 09/13/15 12:35 PM  Result Value Ref Range   Troponin I 0.14 (H) <0.031 ng/mL    Comment:        PERSISTENTLY INCREASED TROPONIN VALUES IN THE RANGE OF 0.04-0.49 ng/mL CAN BE SEEN IN:       -UNSTABLE ANGINA       -CONGESTIVE HEART FAILURE       -MYOCARDITIS       -CHEST TRAUMA       -ARRYHTHMIAS       -LATE PRESENTING MYOCARDIAL INFARCTION       -COPD   CLINICAL FOLLOW-UP RECOMMENDED.   Troponin I     Status: Abnormal   Collection Time: 09/13/15  7:20 PM  Result Value Ref Range   Troponin I 0.11 (H) <0.031 ng/mL    Comment:        PERSISTENTLY INCREASED TROPONIN VALUES IN THE RANGE OF 0.04-0.49 ng/mL CAN BE SEEN IN:       -UNSTABLE ANGINA       -CONGESTIVE HEART FAILURE       -MYOCARDITIS       -CHEST TRAUMA       -ARRYHTHMIAS       -LATE PRESENTING MYOCARDIAL INFARCTION       -COPD   CLINICAL FOLLOW-UP RECOMMENDED.   Troponin I     Status: Abnormal   Collection Time: 09/13/15 11:52 PM  Result Value Ref Range   Troponin I 0.11 (H) <0.031 ng/mL    Comment:        PERSISTENTLY INCREASED TROPONIN VALUES IN THE RANGE OF 0.04-0.49 ng/mL CAN BE SEEN IN:       -UNSTABLE ANGINA       -CONGESTIVE HEART FAILURE       -MYOCARDITIS       -CHEST TRAUMA       -ARRYHTHMIAS       -LATE PRESENTING MYOCARDIAL INFARCTION       -COPD   CLINICAL FOLLOW-UP RECOMMENDED.   Troponin I     Status: Abnormal   Collection Time: 09/14/15  5:52 AM  Result Value Ref Range   Troponin I 0.10 (H) <0.031  ng/mL    Comment:        PERSISTENTLY INCREASED TROPONIN VALUES IN THE RANGE OF 0.04-0.49 ng/mL CAN BE SEEN IN:       -UNSTABLE ANGINA       -CONGESTIVE HEART FAILURE       -MYOCARDITIS       -CHEST TRAUMA       -ARRYHTHMIAS       -LATE PRESENTING MYOCARDIAL INFARCTION       -COPD   CLINICAL FOLLOW-UP RECOMMENDED.    No  results found.   Medical Problem List and Plan: 1.  Persistent frontal headaches generalized weakness with altered mental status secondary to traumatic ICH 2.  DVT Prophylaxis/Anticoagulation: SCDs. Monitor for any signs of DVT 3. Pain Management: Percocet as needed 4. 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 Follow-up outpatient interventional radiology Dr. Sabas Sous 5. Neuropsych: This patient is not capable of making decisions on her own behalf. 6. Skin/Wound Care: Routine skin checks 7. Fluids/Electrolytes/Nutrition: Routine eye and nose with follow-up chemistries 8. History of goblet cell carcinoid status post hemicolectomy. Follow-up [facility]. 9. Hyperlipidemia. Crestor 10. HTN: monitor and provide prns in accordance with increased physical exertion and pain 11. ABLA: Monitor and transfuse if necessary 12. Thrombocytopenia: Cont to follow platelets  Post Admission Physician Evaluation: 1. Functional deficits secondary  to traumatic ICH. 2. Patient is admitted to receive collaborative, interdisciplinary care between the physiatrist, rehab nursing staff, and therapy team. 3. Patient's level of medical complexity and substantial therapy needs in context of that medical necessity cannot be provided at a lesser intensity of care such as a SNF. 4. Patient has experienced substantial functional loss from his/her baseline which was documented above under the "Functional History" and "Functional Status" headings.  Judging by the patient's diagnosis, physical exam, and functional history, the patient has potential for functional progress which will result in measurable gains while on inpatient rehab.  These gains will be of substantial and practical use upon discharge  in facilitating mobility and self-care at the household level. 5. Physiatrist will provide 24 hour management of medical needs as well as oversight of the therapy plan/treatment and provide guidance as  appropriate regarding the interaction of the two. 6. 24 hour rehab nursing will assist with bladder management, safety, skin/wound care, disease management, medication administration and patient education and help integrate therapy concepts, techniques,education, etc. 7. PT will assess and treat for/with: Lower extremity strength, range of motion, stamina, balance, functional mobility, safety, adaptive techniques and equipment, coping skills, pain control, brain injury education.   Goals are: Mod I. 8. OT will assess and treat for/with: ADL's, functional mobility, safety, upper extremity strength, adaptive techniques and equipment, ego support, and community reintegration.   Goals are: Mod I. Therapy may proceed with showering this patient. 9. SLP will assess and treat for/with: cognition.  Goals are: Supervision. 10. Case Management and Social Worker will assess and treat for psychological issues and discharge planning. 11. Team conference will be held weekly to assess progress toward goals and to determine barriers to discharge. 12. Patient will receive at least 3 hours of therapy per day at least 5 days per week. 13. ELOS: 7-12 days.       14. Prognosis:  excellent   Delice Lesch, MD 09/14/2015

## 2015-09-14 NOTE — Progress Notes (Signed)
Inpatient Rehabilitation  I met with the patient and her daughter Peter Congo earlier today to discuss recommendation for IP Rehab.  I observed pt. during OT session. Daughter and  Pt. prefer IP Rehab admission vs. SNF.  Pt. was assisting her son to some extent PTA and will benefit from CIR to achieve modified independence in her mobility. I have submitted for insurance authorization with pt's [facility] Medicare.  I have updated Jacqualin Combes, RNCM.  She has requested a SNF back up with SW Evette Cristal in the event that we do not get a favorable decision from the insurance company.  Daughter and pt. aware of SNF back up. I will update the team when we receive an insurance decision.  Please call if questions.  [place] Admissions Coordinator Cell [phone] Office [phone]

## 2015-09-14 NOTE — PMR Pre-admission (Signed)
PMR Admission Coordinator Pre-Admission Assessment  Patient: Andrea Keller is an 70 y.o., female MRN: [id number] DOB: Mar 23, 1946 Height: 5\' 3"  (160 cm) Weight: 64.2 kg (141 lb 8.6 oz)              Insurance Information HMO: yes   PPO:      PCP:      IPA:      80/20:      OTHER:  PRIMARY: [employer] Medicare   Policy#: [id number]      Subscriber:  self CM Name:  Andrea Keller      Phone#: [phone]     Fax#:  [phone]: follow up to be done by Andrea Keller (p) [phone]; [phone] via EMR access Pre-Cert#:  [id number]      Employer: retired Benefits:  Phone #: [phone]     Name:  Andrea Keller. Date:  07/03/15     Deduct:  $0      Out of Pocket Max:  $4900      Life Max:  none CIR:  $345 days 1-5; $0 days 6 and beyond      SNF:  $0 days 1-20; $160 days 21-51; $0 days 52-100 Outpatient:  $40 copay per therapy     Co-Pay:  Home Health:  100%      Co-Pay:  DME:  80%     Co-Pay:  20% Providers:  In network SECONDARY:  [facility]      Policy#:      Subscriber:  CM Name:       Phone#:      Fax#:  Pre-Cert#:       Employer:  Benefits:  Phone #:      Name:  Eff. Date:      Deduct:       Out of Pocket Max:       Life Max:  CIR:       SNF:  Outpatient:      Co-Pay:  Home Health:       Co-Pay:  DME:      Co-Pay:   Medicaid Application Date:       Case Manager:  Disability Application Date:       Case Worker:   Emergency Contact Information Contact Information    Name Relation Home Work Mobile   Andrea Keller,Andrea Keller Spouse [phone]  [phone]     Current Medical History  Patient Admitting Diagnosis: Traumatic ICH History of Present Illness: Andrea Keller is a 70 y.o. 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 Patient lives with spouse and assistance is needed. Independent prior to admission still driving. One level home with 4 steps to entry.  Presented 09/10/2015 with persistent frontal headaches and generalized weakness as well as difficulty in concentrating. She did have a recent fall 09/09/2015. Her head on a dresser. CT of the head showed a 2.5 cm hemorrhage within the right frontal lobe with extensive white matter hypodensity throughout the right cerebral hemisphere. MRI completed showed right frontal lobe 5.3 x 3.3 cm hemorrhage with disproportionate marginal enhancement and mass effect/right frontoparietal subarachnoid hemorrhage. EEG negative seizure. CT of the chest negative. 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 Sabas Sous as an outpatient. Neurology consulted plan repeat MRI in 2-4 weeks for ongoing evaluation of either hemorrhagic metastasis or hemorrhagic infarct.  Decadron protocol. Mildly elevated troponins with diffuse T-wave inversions likely due to [place]. Echocardiogram 09/13/2015 with ejection fraction of [id number] grade 1 diastolic dysfunction. No cardiac source of emboli. Tolerating a regular diet. Physical and Occupational therapy evaluations completed with recommendations of physical medicine rehabilitation consult.Patient was admitted for a comprehensive rehabilitation program Total: 1 NIH    Past Medical History  Past Medical History  Diagnosis Date  . Diverticulosis   . Hyperplastic colon polyp   . Insomnia   . Microcytosis   . Osteoarthritis   . PVD (peripheral vascular disease) ([place])   . Multiple thyroid nodules   . Hypertension   . Goblet cell carcinoid ([place]) 11/08/2014    Family History  family history includes Colon cancer in her brother; Diabetes in her mother; Lung cancer in her father.  Prior Rehab/Hospitalizations:  Has the patient had major surgery during 100 days prior to admission? No surgeries in past 4 months; no prior rehab  Current Medications   Current facility-administered  medications:  .  acetaminophen (TYLENOL) tablet 650 mg, 650 mg, Oral, Daily PRN, Ivor Costa, MD, 650 mg at 09/13/15 1008 .  amLODipine (NORVASC) tablet 5 mg, 5 mg, Oral, Daily, Jonetta Osgood, MD, 5 mg at 09/14/15 0937 .  dexamethasone (DECADRON) tablet 6 mg, 6 mg, Oral, 3 times per day, Jonetta Osgood, MD, 6 mg at 09/14/15 1345 .  ferrous sulfate tablet 325 mg, 325 mg, Oral, Q breakfast, Ivor Costa, MD, 325 mg at 09/14/15 0810 .  ondansetron (ZOFRAN) injection 4 mg, 4 mg, Intravenous, Q6H PRN, Jonetta Osgood, MD .  oxyCODONE-acetaminophen (PERCOCET/ROXICET) 5-325 MG per tablet 1-2 tablet, 1-2 tablet, Oral, Q4H PRN, Ivor Costa, MD .  pantoprazole (PROTONIX) EC tablet 40 mg, 40 mg, Oral, Q1200, Jonetta Osgood, MD, 40 mg at 09/14/15 0936 .  polyethylene glycol (MIRALAX / GLYCOLAX) packet 17 g, 17 g, Oral, Daily, Jonetta Osgood, MD, 17 g at 09/13/15 2254 .  rosuvastatin (CRESTOR) tablet 10 mg, 10 mg, Oral, q1800, Rosalin Hawking, MD, 10 mg at 09/13/15 1714 .  senna-docusate (Senokot-S) tablet 1 tablet, 1 tablet, Oral, QHS PRN, Ivor Costa, MD, 1 tablet at 09/13/15 0654 .  zolpidem (AMBIEN) tablet 5 mg, 5 mg, Oral, QHS PRN, Ivor Costa, MD  Patients Current Diet: Diet Heart Room service appropriate?: Yes; Fluid consistency:: Thin Diet - low sodium heart healthy  Precautions / Restrictions Precautions Precautions: Fall Restrictions Weight Bearing Restrictions: No   Has the patient had 2 or more falls or a fall with injury in the past year?Yes, fell on 09/09/15 striking her head  Prior Activity Level Limited Community (1-2x/wk): Per pt. and husband, pt. is out of the house about twice per week, once to go shopping at [place] and once to go to [employer] / [place] Devices/Equipment: None Home Equipment: Environmental consultant - 2 wheels  Prior Device Use: Indicate devices/aids used by the patient prior to current illness, exacerbation or injury? None of the above  Prior  Functional Level Prior Function Level of Independence: Independent Comments: still driving, Ind w/ all mobility and ADLs  Self Care: Did the patient need help bathing, dressing, using the toilet or eating?  Independentreports she needs increased time with putting on socks and pants  Indoor Mobility: Did the patient need assistance with walking from room to room (with or without device)? Independent  Stairs: Did the patient need assistance with internal or external stairs (with or without device)? Independent  Functional  Cognition: Did the patient need help planning regular tasks such as shopping or remembering to take medications? Independent  Current Functional Level Cognition  Arousal/Alertness: Awake/alert Overall Cognitive Status:  (unsure of baseline) Current Attention Level: Selective Orientation Level: Oriented X4 Following Commands: Follows one step commands inconsistently Safety/Judgement: Decreased awareness of safety, Decreased awareness of deficits (L sided neglect) General Comments: cues given to attend to left    Extremity Assessment (includes Sensation/Coordination)  Upper Extremity Assessment: LUE deficits/detail LUE Deficits / Details: Difficulty engaging LUE spontaneously. alien like movements at times. R bias LUE Coordination: decreased fine motor  Lower Extremity Assessment: Defer to PT evaluation    ADLs  Overall ADL's : Needs assistance/impaired Eating/Feeding Details (indicate cue type and reason): will further assess Grooming: Oral care, Standing, Set up, Supervision/safety (also wiped off hand) Grooming Details (indicate cue type and reason): washed hands at sink and walked away without drying hands.Required mod vc to find paper towel holder Upper Body Bathing: Minimal assitance Lower Body Bathing: Moderate assistance Upper Body Dressing : Moderate assistance Lower Body Dressing: Moderate assistance Lower Body Dressing Details (indicate cue type and  reason): perseerating on removing shoes. continued to try to put shoes back on while given command toremove shoes. Given command tohand therapist belt and pt continued to try to thread belt loop and was unable to terminate actio to hand therapist bilet. Required hand over hand to terminate activity. Toilet Transfer: Min guard, Ambulation (sit to stand from bed) Toileting- Clothing Manipulation and Hygiene: Minimal assistance (standing) Toileting - Clothing Manipulation Details (indicate cue type and reason): pt pulled down underwear part way to wash peri area and bottom; also dryed off peri area; assist to pull up left side of underwear Functional mobility during ADLs: Min guard General ADL Comments: Cues to attend to left. Pt seemed to have slow processing.     Mobility  Overal bed mobility: Needs Assistance Bed Mobility: Supine to Sit, Sit to Supine Supine to sit: Modified independent (Device/Increase time) Sit to supine:  (see comments) General bed mobility comments: pt able to go from sit to supine with Mod I, but assist given to scoot to [facility].    Transfers  Overall transfer level: Needs assistance Equipment used: None, 1 person hand held assist Transfers: Sit to/from Stand Sit to Stand: Supervision General transfer comment: slow to stand, v/c's for technique    Ambulation / Gait / Stairs / Wheelchair Mobility  Ambulation/Gait Ambulation/Gait assistance: Museum/gallery curator (Feet): 300 Feet Assistive device: None Gait Pattern/deviations: Decreased step length - right, Shuffle, Narrow base of support General Gait Details: extremely slow, guarded, trunk flexed holding hands near body. no episodes of LOB however pt unable to navigate around obstacles on the L, find her way back to her room, process multi-step commands to complete the DGI. pt unable to problem solve on how to navigate around obstacle once she ran into it Gait velocity interpretation: <1.8 ft/sec, indicative of  risk for recurrent falls Stairs: Yes Stairs assistance: Min assist Stair Management: One rail Right, Step to pattern, Forwards Number of Stairs: 4 General stair comments: Pt requires max verbal cues and tactile cues and took a few minutes to grasp task of descending the steps.  Min assist to steady.    Posture / Balance Balance Overall balance assessment: Needs assistance, History of Falls Sitting-balance support: No upper extremity supported, Feet supported Sitting balance-Leahy Scale: Good Standing balance support: No upper extremity supported, During functional activity Standing balance-Leahy Scale: Fair Standardized Balance  Assessment Standardized Balance Assessment : Dynamic Gait Index (difficult to assess due to pt's impaired command follow) Dynamic Gait Index Level Surface: Mild Impairment Change in Gait Speed: Moderate Impairment Gait with Horizontal Head Turns: Moderate Impairment Gait with Vertical Head Turns: Mild Impairment Gait and Pivot Turn: Moderate Impairment Step Over Obstacle: Mild Impairment Step Around Obstacles: Moderate Impairment Steps: Mild Impairment Total Score: 12    Special needs/care consideration BiPAP/CPAP   no CPM  no Continuous Drip IV   no Dialysis   no        Life Vest   no Oxygen   no Special Bed   no Trach Size   no Wound Vac (area)   no       Skin     WDL per nursing assessment                            Bowel mgmt: last BM 09/14/15, continent Bladder mgmt:  Continent to bathroom with assist Diabetic mgmt  n/a     Previous Home Environment Living Arrangements: Spouse/significant other, Children  Lives With: Spouse, Son Available Help at Discharge: Family, Available 24 hours/day Type of Home: House Home Layout: One level Home Access: Stairs to enter Entrance Stairs-Rails: None Technical brewer of Steps: 4 Bathroom Shower/Tub: Gaffer, Door, Architectural technologist: Programmer, systems: Yes Home Care  Services: No  Discharge Living Setting Plans for Discharge Living Setting: Patient's home Type of Home at Discharge: House Discharge Home Layout: One level Discharge Home Access: Stairs to enter Entrance Stairs-Rails: None Entrance Stairs-Number of Steps: 3 Discharge Bathroom Shower/Tub: Tub/shower unit Discharge Bathroom Toilet: Standard Discharge Bathroom Accessibility: Yes How Accessible: Accessible via walker Does the patient have any problems obtaining your medications?: No  Social/Family/Support Systems Patient Roles: Spouse, Parent (adult son lives in the home with h/o cognitive deficits) Anticipated Caregiver: husband Andrea Keller Anticipated Caregiver's Contact Information: [phone], husband Ability/Limitations of Caregiver: no limitations, husband is out of the home on errands several times per week Caregiver Availability: 24/7 Discharge Plan Discussed with Primary Caregiver: Yes Is Caregiver In Agreement with Plan?: Yes Does Caregiver/Family have Issues with Lodging/Transportation while Pt is in Rehab?: No   Goals/Additional Needs Patient/Family Goal for Rehab: modified independent PT/OT; supervision SLP Expected length of stay: 10-13 days Cultural Considerations: none Dietary Needs: heart healthy, thin liquids Equipment Needs: TBA Additional Information: adult son , Andrea Keller has dialysis 3x/week Pt/Family Agrees to Admission and willing to participate: Yes Program Orientation Provided & Reviewed with Pt/Caregiver Including Roles  & Responsibilities: Yes   Decrease burden of Care through IP rehab admission: no   Possible need for SNF placement upon discharge:   Not anticipated   Patient Condition: This patient's condition remains as documented in the consult dated 09/13/15 , in which the Rehabilitation Physician determined and documented that the patient's condition is appropriate for intensive rehabilitative care in an inpatient rehabilitation facility. Will  admit to inpatient rehab today.  Preadmission Screen Completed By:  Gerlean Ren, 09/14/2015 3:27 PM ______________________________________________________________________   Discussed status with Dr.  Posey Pronto on 09/14/15 at  1525  and received telephone approval for admission today.  Admission Coordinator:  Gerlean Ren, time 1525 Sudie Grumbling 09/14/15

## 2015-09-15 ENCOUNTER — Inpatient Hospital Stay (HOSPITAL_COMMUNITY): Payer: Medicare Other | Admitting: Occupational Therapy

## 2015-09-15 ENCOUNTER — Inpatient Hospital Stay (HOSPITAL_COMMUNITY): Payer: Medicare Other | Admitting: Physical Therapy

## 2015-09-15 ENCOUNTER — Inpatient Hospital Stay (HOSPITAL_COMMUNITY): Payer: Medicare Other | Admitting: Speech Pathology

## 2015-09-15 DIAGNOSIS — R7989 Other specified abnormal findings of blood chemistry: Secondary | ICD-10-CM

## 2015-09-15 NOTE — Progress Notes (Signed)
Patient information reviewed and entered into eRehab system by Tamari Redwine, RN, CRRN, PPS Coordinator.  Information including medical coding and functional independence measure will be reviewed and updated through discharge.     Per nursing patient was given "Data Collection Information Summary for Patients in Inpatient Rehabilitation Facilities with attached "Privacy Act Statement-Health Care Records" upon admission.  

## 2015-09-15 NOTE — Evaluation (Signed)
Physical Therapy Assessment and Plan  Patient Details  Name: Andrea Keller MRN: [id number] Date of Birth: 1946-02-25  PT Diagnosis: Impaired cognition and Muscle weakness,visual perceptual impairments Rehab Potential: Good ELOS: 5-7 days   Today's Date: 09/15/2015 PT Individual Time: 1045-1200 PT Individual Time Calculation (min): 75 min    Problem List:  Patient Active Problem List   Diagnosis Date Noted  . ICH (intracerebral hemorrhage) ([place]) 09/14/2015  . Transient alteration of awareness   . Other vascular headache   . Brain aneurysm   . Benign essential HTN   . Acute blood loss anemia   . Elevated troponin   . Essential hypertension   . HLD (hyperlipidemia)   . Malignant neoplasm of colon ([place])   . 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 Intraparenchymal hemorrhage of brain ([place]) 09/10/2015  . Microcytosis   . PVD (peripheral vascular disease) ([place])   . Hypertension   . 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 Thrombocytopenia ([place]) 11/08/2014  . Acute appendicitis 10/28/2014  . Perforated appendicitis 10/28/2014    Past Medical History:  Past Medical History  Diagnosis Date  . Diverticulosis   . Hyperplastic colon polyp   . Insomnia   . Microcytosis   . Osteoarthritis   . PVD (peripheral vascular disease) ([place])   . Multiple thyroid nodules   . Hypertension   . 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 Cyst removal neck    . Abdominal hysterectomy  2009  . Laparoscopic appendectomy N/A 10/28/2014    Procedure: APPENDECTOMY LAPAROSCOPIC;  Surgeon: Erroll Luna, MD;  Location: [facility];  Service: General;  Laterality: N/A;  . Appendectomy  10/28/2014    Assessment & Plan Clinical Impression: Andrea Keller is a 70 y.o. 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 Patient lives with spouse and assistance is needed. Independent prior to admission still driving. One level home with 4 steps to entry. Presented 09/10/2015 with persistent frontal headaches and generalized weakness as well as difficulty in concentrating. She did have a recent fall 09/09/2015. Her head on a dresser. CT of the head showed a 2.5 cm hemorrhage within the right frontal lobe with extensive white matter hypodensity throughout the right cerebral hemisphere. MRI completed showed right frontal lobe 5.3 x 3.3 cm hemorrhage with disproportionate marginal enhancement and mass effect/right frontoparietal subarachnoid hemorrhage. EEG negative seizure. CT of the chest negative. 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 Sabas Sous as an outpatient. Neurology consulted plan repeat MRI in 2-4 weeks for ongoing evaluation of either hemorrhagic metastasis or hemorrhagic infarct. Decadron protocol. Mildly elevated troponins with diffuse T-wave inversions likely due to [place]. Echocardiogram 09/13/2015 with ejection fraction of 78% grade 1 diastolic dysfunction. No cardiac source of emboli. Tolerating a regular diet. Patient transferred to CIR on 09/14/2015.   Patient currently requires supervision with mobility secondary to motor apraxia, decreased coordination and decreased motor planning, left side neglect, decreased initiation, decreased attention, decreased awareness, decreased problem solving, decreased safety awareness, decreased memory and delayed processing and decreased standing balance and decreased balance strategies.  Prior to hospitalization, patient was modified independent  with mobility and lived with Spouse, Son in a House home.  Home access is 3Stairs to enter.  Patient will benefit from skilled PT intervention to maximize safe functional mobility, minimize fall risk and  decrease caregiver burden for planned discharge home with 24 hour supervision.  Anticipate patient will not need PT follow up at discharge.  PT - End of Session Activity Tolerance: Improving Endurance Deficit: No PT Assessment Rehab Potential (ACUTE/IP ONLY): Good PT Patient demonstrates impairments in the following area(s): Balance;Behavior;Motor;Nutrition;Perception;Safety PT Transfers Functional Problem(s): Bed Mobility;Bed to Chair;Car;Furniture;Floor PT Locomotion Functional Problem(s): Ambulation;Stairs PT Plan PT Intensity: Minimum of 1-2 x/day ,45 to 90 minutes PT Frequency: 5 out of 7 days PT Duration Estimated Length of Stay: 5-7 days PT Treatment/Interventions: Ambulation/gait training;Balance/vestibular training;Cognitive remediation/compensation;Community reintegration;Discharge planning;Disease management/prevention;Functional mobility training;Neuromuscular re-education;Pain management;Patient/family education;Psychosocial support;Stair training;Therapeutic Activities;Therapeutic Exercise;Visual/perceptual remediation/compensation;UE/LE Coordination activities;UE/LE Strength taining/ROM PT Transfers Anticipated Outcome(s): mod I PT Locomotion Anticipated Outcome(s): mod I household, supervision community PT Recommendation Follow Up Recommendations: 24 hour supervision/assistance;None Patient destination: Home Equipment Recommended: None recommended by PT  Skilled Therapeutic Intervention Skilled therapeutic intervention initiated after completion of evaluation. Discussed with patient and family falls risk, safety within room, and focus of therapy during stay. Discussed possible length of stay, goals, and follow-up therapy. Patient overall supervision level without AD with no apparent endurance deficits noted. Patient demonstrated L neglect of body and environment throughout session as well as perseverative behaviors with multiple functional tasks including buckling seat belt in  simulated car. Per daughter, patient with very slow gait speed at baseline. Patient scored 47/56 on Berg Balance Scale, 5TSS = 21 sec, gait speed 10 MWT = 0.33 m/s, and FGA = 13/30 which appeared negatively impacted by slow gait speed, LLE neglect, and decreased ability to process and follow simple commands despite max verbal/visual cues as patient had no LOB with balance tests or other functional tasks throughout session. Recommend 24/7 supervision due to cognition and visual spatial impairments at discharge. Patient left sitting in recliner with family present.   PT Evaluation Precautions/Restrictions Precautions Precautions: Fall Restrictions Weight Bearing Restrictions: No General Chart Reviewed: Yes Family/Caregiver Present: Yes (daughter Peter [place])  Pain Pain Assessment Pain Assessment: No/denies pain Pain Score: 0-No pain Home Living/Prior Functioning Home Living Living Arrangements: Spouse/significant other;Children Available Help at Discharge: Family;Available 24 hours/day Type of Home: House Home Access: Stairs to enter [employer] of Steps: 3 Entrance Stairs-Rails: None Home Layout: One level Bathroom Shower/Tub: Chiropodist: Standard Bathroom Accessibility: Yes  Lives With: Spouse;Son Prior Function Level of Independence: Independent with basic ADLs;Independent with gait;Independent with transfers;Independent with homemaking with ambulation  Able to Take Stairs?: Yes Driving: Yes Vocation: Retired Leisure: Hobbies-yes (Comment) Comments: cleaning Vision/Perception   L neglect, defer to OT evaluation  Cognition Overall Cognitive Status: Impaired/Different from baseline Arousal/Alertness: Awake/alert Orientation Level: Oriented to person;Oriented to place Attention: Sustained Sustained Attention: Impaired Sustained Attention Impairment: Verbal basic;Functional basic Memory: Impaired Memory Impairment: Decreased recall of new  information;Retrieval deficit Awareness: Impaired Awareness Impairment: Intellectual impairment;Emergent impairment Problem Solving: Impaired Problem Solving Impairment: Verbal basic;Functional basic Executive Function:  (all impaired given underlying attention/problem solving limitations) Sensation Sensation Light Touch: Appears Intact Hot/Cold: Appears Intact Proprioception: Impaired Detail Proprioception Impaired Details: Impaired LUE;Impaired LLE Coordination Gross Motor Movements are Fluid and Coordinated: Yes Heel Shin Test: required max multimodal cues to complete task, WFL for task Motor  Motor Motor: Motor apraxia;Motor perseverations  Mobility Bed Mobility Bed Mobility: Rolling Right;Rolling Left;Sit to Supine;Supine to Sit Rolling Right: 5: Supervision Rolling Left: 5: Supervision Supine to Sit: 5: Supervision Sit to Supine: 5: Supervision Transfers Transfers: Yes Stand  Pivot Transfers: 5: Supervision Locomotion  Ambulation Ambulation: Yes Ambulation/Gait Assistance: 5: Supervision Ambulation Distance (Feet): 300 Feet Assistive device: None Gait Gait: Yes Gait Pattern: Impaired Gait Pattern: Step-through pattern;Decreased stride length Gait velocity: 10 MWT = 0.33 m/s Stairs / Additional Locomotion Stairs: Yes Stairs Assistance: 5: Supervision Stair Management Technique: One rail Right;Step to pattern;Forwards Number of Stairs: 12 Height of Stairs: 6 Wheelchair Mobility Wheelchair Mobility: No (pt ambulatory)  Trunk/Postural Assessment  Cervical Assessment Cervical Assessment: Exceptions to [facility] (slight forward head) Thoracic Assessment Thoracic Assessment: Exceptions to [facility] (slight kyphosis) Lumbar Assessment Lumbar Assessment: Exceptions to [facility] (posterior pelvic tilt) Postural Control Postural Control: Deficits on evaluation Protective Responses: delayed  Balance Five times Sit to Stand Test (FTSS) Method: Use a straight back chair with a solid  seat that is 16-18" high. Ask participant to sit on the chair with arms folded across their chest.   Instructions: "Stand up and sit down as quickly as possible 5 times, keeping your arms folded across your chest."   Measurement: Stop timing when the participant stands the 5th time.  TIME: ___21___ (in seconds)  Times > 13.6 seconds is associated with increased disability and morbidity (Guralnik, 2000) Times > 15 seconds is predictive of recurrent falls in healthy individuals aged 68 and older (Buatois, et al., 2008) Normal performance values in community dwelling individuals aged 48 and older ([place], 2006): o 60-69 years: 11.4 seconds o 70-79 years: 12.6 seconds o 80-89 years: 14.8 seconds  MCID: ? 2.3 seconds for Vestibular Disorders ([place]ret[place], 2006) Functional Gait Assessment (FGA) Requirements: A marked 6-m (20-ft) walkway that is marked with a 30.48-cm (12-in) width.  _1_ 1. GAIT LEVEL SURFACE Instructions: Walk at your normal speed from here to the next mark (6 m[20 ft]). Grading: Elta [place] the highest category that applies. (3) Normal-Walks 6 m (20 ft) in less than 5.5 seconds, no assistive devices, good speed, no evidence for imbalance, normal gait pattern, deviates no more than 15.24 cm (6 in) outside of the 30.48-cm (12-in) walkway width. (2) Mild impairment-Walks 6 m (20 ft) in less than 7 seconds but greater than 5.5 seconds, uses assistive device, slower speed, mild gait deviations, or deviates 15.24-25.4 cm (6-10 in) outside of the 30.48-cm (12-in) walkway width. (1) Moderate impairment-Walks 6 m (20 ft), slow speed, abnormal gait pattern, evidence for imbalance, or deviates 25.4-38.1 cm (10-15 in) outside of the 30.48-cm (12-in) walkway width. Requires more than 7 seconds to ambulate 6 m (20 ft). (0) Severe impairment-Cannot walk 6 m (20 ft) without assistance,severe gait deviations or imbalance, deviates greater than 38.1 cm (15 in) outside of the 30.48-cm (12-in) walkway width  or reaches and touches the wall.  _1_ 2. CHANGE IN GAIT SPEED Instructions: Begin walking at your normal pace (for 1.5 m [5 ft]). When I tell you "go," walk as fast as you can (for 1.5 m [5 ft]). When I tell you "slow," walk as slowly as you can (for 1.5 m [5 ft]). Grading: Elta [place] the highest category that applies. (3) Normal-Able to smoothly change walking speed without loss of balance or gait deviation. Shows a significant difference in walking speeds between normal, fast, and slow speeds. Deviates no more than 15.24 cm (6 in) outside of the 30.48-cm (12-in) walkway width. (2) Mild impairment-Is able to change speed but demonstrates mild gait deviations, deviates 15.24-25.4 cm (6-10 in) outside of the 30.48-cm (12-in) walkway width, or no gait deviations but unable to achieve a significant change in velocity, or uses an assistive  device. (1) Moderate impairment-Makes only minor adjustments to walking speed, or accomplishes a change in speed with significant gait deviations, deviates 25.4-38.1 cm (10-15 in) outside the 30.48-cm (12-in) walkway width, or changes speed but loses balance but is able to recover and continue walking. (0) Severe impairment-Cannot change speeds, deviates greater than 38.1 cm (15 in) outside 30.48-cm (12-in) walkway width, or loses balance and has to reach for wall or be caught.  3 3. GAIT WITH HORIZONTAL HEAD TURNS Instructions: Walk from here to the next mark 6 m (20 ft) away. Begin walking at your normal pace. Keep walking straight; after 3 steps, turn your head to the right and keep walking straight while looking to the right. After 3 more steps, turn your head to the left and keep walking straight while looking left. Continue alternating looking right and left every 3 steps until you have completed 2 repetitions in each direction. Grading: [place]ta [place] the highest category that applies. (3) Normal-Performs head turns smoothly with no change in gait. Deviates no more than 15.24 cm  (6 in) outside 30.48-cm (12-in) walkway width. (2) Mild impairment-Performs head turns smoothly with slight change in gait velocity (eg, minor disruption to smooth gait path), deviates 15.24-25.4 cm (6-10 in) outside 30.48-cm (12-in) walkway width, or uses an assistive device.  (1) Moderate impairment-Performs head turns with moderate change in gait velocity, slows down, deviates 25.4-38.1 cm (10-15 in) outside 30.48-cm (12-in) walkway width but recovers, can continue to walk. (0) Severe impairment-Performs task with severe disruption of gait (eg, staggers 38.1 cm [15 in] outside 30.48-cm (12-in) walkway width, loses balance, stops, or reaches for wall).  _3_ 4. GAIT WITH VERTICAL HEAD TURNS Instructions: Walk from here to the next mark (6 m [20 ft]). Begin walking at your normal pace. Keep walking straight; after 3 steps, tip your head up and keep walking straight while looking up. After 3 more steps, tip your head down, keep walking straight while looking down. Continue alternating looking up and down every 3 steps until you have completed 2 repetitions in each direction. Grading: [place]ta [place] the highest category that applies. (3) Normal-Performs head turns with no change in gait. Deviates no more than 15.24 cm (6 in) outside 30.48-cm (12-in) walkway width. (2) Mild impairment-Performs task with slight change in gait velocity (eg, minor disruption to smooth gait path), deviates 15.24-25.4 cm (6-10 in) outside 30.48-cm (12-in) walkway width or uses assistive device. (1) Moderate impairment-Performs task with moderate change in gait velocity, slows down, deviates 25.4-38.1 cm (10-15 in) outside 30.48-cm (12-in) walkway width but recovers, can continue to walk. (0) Severe impairment-Performs task with severe disruption of gait (eg, staggers 38.1 cm [15 in] outside 30.48-cm (12-in) walkway width, loses balance, stops, reaches for wall).  _1_ 5. GAIT AND PIVOT TURN Instructions: Begin with walking at your  normal pace. When I tell you, "turn and stop," turn as quickly as you can to face the opposite direction and stop. Grading: [place]ta [place] the highest category that applies. (3) Normal-Pivot turns safely within 3 seconds and stops quickly with no loss of balance. (2) Mild impairment-Pivot turns safely in _3 seconds and stops with no loss of balance, or pivot turns safely within 3 seconds and stops with mild imbalance, requires small steps to catch balance. (1) Moderate impairment-Turns slowly, requires verbal cueing, or requires several small steps to catch balance following turn and stop. (0) Severe impairment-Cannot turn safely, requires assistance to turn and stop.  _1_ 6. STEP OVER OBSTACLE Instructions: Begin walking at your  normal speed. When you come to the shoe box, step over it, not around it, and keep walking. Grading: [place]ta [place] the highest category that applies. (3) Normal-Is able to step over 2 stacked shoe boxes taped together (22.86 cm [9 in] total height) without changing gait speed; no evidence of imbalance. (2) Mild impairment-Is able to step over one shoe box (11.43 cm [4.5 in] total height) without changing gait speed; no evidence of imbalance. (1) Moderate impairment-Is able to step over one shoe box (11.43 cm [4.5 in] total height) but must slow down and adjust steps to clear box safely. May require verbal cueing. (0) Severe impairment-Cannot perform without assistance.  _0_ 7. GAIT WITH NARROW BASE OF SUPPORT Instructions: Walk on the floor with arms folded across the chest, feet aligned heel to toe in tandem for a distance of 3.6 m [12 ft]. The number of steps taken in a straight line are counted for a maximum of 10 steps. Grading: [place]ta [place] the highest category that applies. (3) Normal-Is able to ambulate for 10 steps heel to toe with no staggering. (2) Mild impairment-Ambulates 7-9 steps. (1) Moderate impairment-Ambulates 4-7 steps. (0) Severe impairment-Ambulates less than 4 steps heel to  toe or cannot perform without assistance.  _1_ 8. GAIT WITH EYES CLOSED Instructions: Walk at your normal speed from here to the next mark (6 m [20 ft]) with your eyes closed. Grading: [place]ta [place] the highest category that applies. (3) Normal-Walks 6 m (20 ft), no assistive devices, good speed, no evidence of imbalance, normal gait pattern, deviates no more than 15.24 cm (6 in) outside 30.48-cm (12-in) walkway width. Ambulates 6 m (20 ft) in less than 7 seconds. (2) Mild impairment-Walks 6 m (20 ft), uses assistive device, slower speed, mild gait deviations, deviates 15.24-25.4 cm (6-10 in) outside 30.48-cm (12-in) walkway width. Ambulates 6 m (20 ft) in less than 9 seconds but greater than 7 seconds. (1) Moderate impairment-Walks 6 m (20 ft), slow speed, abnormal gait pattern, evidence for imbalance, deviates 25.4-38.1 cm (10-15 in) outside 30.48-cm (12-in) walkway width. Requires more than 9 seconds to ambulate 6 m (20 ft). (0) Severe impairment-Cannot walk 6 m (20 ft) without assistance, severe gait deviations or imbalance, deviates greater than 38.1 cm (15 in) outside 30.48-cm (12-in) walkway width or will not attempt task.  1__ 9. AMBULATING BACKWARDS Instructions: Walk backwards until I tell you to stop. Grading: [place]ta [place] the highest category that applies. (3) Normal-Walks 6 m (20 ft), no assistive devices, good speed, no evidence for imbalance, normal gait pattern, deviates no more than 15.24 cm (6 in) outside 30.48-cm (12-in) walkway width. (2) Mild impairment-Walks 6 m (20 ft), uses assistive device, slower speed, mild gait deviations, deviates 15.24-25.4 cm (6-10 in) outside 30.48-cm (12-in) walkway width. (1) Moderate impairment-Walks 6 m (20 ft), slow speed, abnormal gait pattern, evidence for imbalance, deviates 25.4-38.1 cm (10-15 in) outside 30.48-cm (12-in) walkway width. (0) Severe impairment-Cannot walk 6 m (20 ft) without assistance, severe gait deviations or imbalance, deviates greater than  38.1 cm (15 in) outside 30.48-cm (12-in) walkway width or will not attempt task.  _1_ 10. STEPS Instructions: Walk up these stairs as you would at home (ie, using the rail if necessary). At the top turn around and walk down. Grading: Elta [place] the highest category that applies. (3) Normal-Alternating feet, no rail. (2) Mild impairment-Alternating feet, must use rail. (1) Moderate impairment-Two feet to a stair; must use rail. (0) Severe impairment-Cannot do safely.  TOTAL SCORE: ___13___ /30 (MAXIMUM SCORE=30)  Scores  of ? 22/30 on the FGA were found to be effective in predicting falls, Sensitivity 85%, Specificity 86% Scores of ? 20/30 on the FGA were optimal to predict older adults residing in community dwellings who would sustain unexplained falls in the next 6 months, Sensitivity 100%, Specificity 76% ([place], 2010; aged 55 to 62, Older Adults) [place]: 4.2 points for CVA Augustin Coupe et al, 2010) MCID: 8 points for Balance and Vestibular Disorders Marjorie Smolder and Augustin Coupe, 2014) Balance Balance Assessed: Yes Standardized Balance Assessment Standardized Balance Assessment: Berg Balance Test Berg Balance Test Sit to Stand: Able to stand without using hands and stabilize independently Standing Unsupported: Able to stand safely 2 minutes Sitting with Back Unsupported but Feet Supported on Floor or Stool: Able to sit safely and securely 2 minutes Stand to Sit: Sits safely with minimal use of hands Transfers: Able to transfer safely, minor use of hands Standing Unsupported with Eyes Closed: Able to stand 10 seconds safely Standing Ubsupported with Feet Together: Able to place feet together independently and stand 1 minute safely From Standing, Reach Forward with Outstretched Arm: Can reach forward >12 cm safely (5") From Standing Position, Pick up Object from Floor: Able to pick up shoe, needs supervision From Standing Position, Turn to Look Behind Over each Shoulder: Looks behind one side only/other  side shows less weight shift Turn 360 Degrees: Able to turn 360 degrees safely but slowly Standing Unsupported, Alternately Place Feet on Step/Stool: Able to stand independently and complete 8 steps >20 seconds Standing Unsupported, One Foot in Front: Able to plae foot ahead of the other independently and hold 30 seconds Standing on One Leg: Able to lift leg independently and hold equal to or more than 3 seconds Total Score: 47 Static Standing Balance Static Standing - Balance Support: No upper extremity supported Static Standing - Level of Assistance: 6: Modified independent (Device/Increase time) Dynamic Standing Balance Dynamic Standing - Balance Support: During functional activity;No upper extremity supported Dynamic Standing - Level of Assistance: 5: Stand by assistance Extremity Assessment  RUE Assessment RUE Assessment: Within Functional Limits (4-/5 throughout) LUE Assessment LUE Assessment: Exceptions to [facility] (full AROM with 4-/5 strength, but impaired awareness/proprioception) RLE Assessment RLE Assessment: Within Functional Limits LLE Assessment LLE Assessment: Within Functional Limits   See Function Navigator for Current Functional Status.   Refer to Care Plan for Long Term Goals  Recommendations for other services: None  Discharge Criteria: Patient will be discharged from PT if patient refuses treatment 3 consecutive times without medical reason, if treatment goals not met, if there is a change in medical status, if patient makes no progress towards goals or if patient is discharged from hospital.  The above assessment, treatment plan, treatment alternatives and goals were discussed and mutually agreed upon: by patient and by family  Laretta Alstrom 09/15/2015, 12:22 PM

## 2015-09-15 NOTE — Evaluation (Signed)
Speech Language Pathology Assessment and Plan  Patient Details  Name: Andrea Keller MRN: [id number] Date of Birth: 1945/08/14  SLP Diagnosis: Cognitive Impairments  Rehab Potential: Excellent ELOS: 7-9 days    Today's Date: 09/15/2015 SLP Individual Time: 1300-1400 SLP Individual Time Calculation (min): 60 min   Problem List:  Patient Active Problem List   Diagnosis Date Noted  . ICH (intracerebral hemorrhage) ([place]) 09/14/2015  . Transient alteration of awareness   . Other vascular headache   . Brain aneurysm   . Benign essential HTN   . Acute blood loss anemia   . Elevated troponin   . Essential hypertension   . HLD (hyperlipidemia)   . Malignant neoplasm of colon ([place])   . 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 Intraparenchymal hemorrhage of brain ([place]) 09/10/2015  . Microcytosis   . PVD (peripheral vascular disease) ([place])   . Hypertension   . 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 Thrombocytopenia ([place]) 11/08/2014  . Acute appendicitis 10/28/2014  . Perforated appendicitis 10/28/2014   Past Medical History:  Past Medical History  Diagnosis Date  . Diverticulosis   . Hyperplastic colon polyp   . Insomnia   . Microcytosis   . Osteoarthritis   . PVD (peripheral vascular disease) ([place])   . Multiple thyroid nodules   . Hypertension   . 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 Cyst removal neck    . Abdominal hysterectomy  2009  . Laparoscopic appendectomy N/A 10/28/2014    Procedure: APPENDECTOMY LAPAROSCOPIC;  Surgeon: Erroll Luna, MD;  Location: [facility];  Service: General;  Laterality: N/A;  . Appendectomy  10/28/2014    Assessment / Plan / Recommendation Clinical Impression   Andrea Keller is a 70 y.o. 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  Patient lives with spouse and assistance is needed. Independent prior to admission still driving. One level home with 4 steps to entry. Presented 09/10/2015 with persistent frontal headaches and generalized weakness as well as difficulty in concentrating. She did have a recent fall 09/09/2015. Her head on a dresser. CT of the head showed a 2.5 cm hemorrhage within the right frontal lobe with extensive white matter hypodensity throughout the right cerebral hemisphere. MRI completed showed right frontal lobe 5.3 x 3.3 cm hemorrhage with disproportionate marginal enhancement and mass effect/right frontoparietal subarachnoid hemorrhage. EEG negative seizure. CT of the chest negative. 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 Sabas Sous as an outpatient. Neurology consulted plan repeat MRI in 2-4 weeks for ongoing evaluation of either hemorrhagic metastasis or hemorrhagic infarct. Decadron protocol. Mildly elevated troponins with diffuse T-wave inversions likely due to [place]. Echocardiogram 09/13/2015 with ejection fraction of 11% grade 1 diastolic dysfunction. No cardiac source of emboli. Tolerating a regular diet.   Pt presents with moderate cognitive impairment which significantly impacts her ability to function independently at home and the community. Pt would benefit from skilled SLP services to target problem solving, attention, functional reading, and increased awareness. Anticipate pt will require 24/7 supervision and outpt therapy services at discharge. Pt is motivated and an excellent candidate for inpatient rehab services.  Skilled Therapeutic Interventions          Pt completed the [facility] Cognitive Assessment (  MOCA) scoring 8/30 which is consistent with significant cognitive limitation. Pt was able to follow 3-step directives with 100% acc once environmental distractions were eliminated. Pt  required min- mod A for functional problem solving related to meal tray management. Max A required for functional math. Speech and language were judged to be [facility].  SLP Assessment  Patient will need skilled [place] Pathology Services during CIR admission    Recommendations  Patient destination: Home Follow up Recommendations: Outpatient SLP;24 hour supervision/assistance Equipment Recommended: None recommended by SLP    SLP Frequency 3 to 5 out of 7 days   SLP Duration  SLP Intensity  SLP Treatment/Interventions 7-9 days  Minumum of 1-2 x/day, 30 to 90 minutes  Cognitive remediation/compensation;Cueing hierarchy;Functional tasks;Environmental controls;Therapeutic Activities;Patient/family education    Pain Pain Assessment Pain Assessment: No/denies pain  Prior Functioning Cognitive/Linguistic Baseline: Within functional limits  Lives With: Spouse;Son Available Help at Discharge: Family;Available 24 hours/day Education: 11th grade  Function:  Eating Eating     Eating Assist Level: Set up assist for   Eating Set Up Assist For: Opening containers       Cognition Comprehension Comprehension assist level: Understands basic 90% of the time/cues < 10% of the time  Expression   Expression assist level: Expresses basic needs/ideas: With extra time/assistive device  Social Interaction Social Interaction assist level: Interacts appropriately with others with medication or extra time (anti-anxiety, antidepressant).  Problem Solving Problem solving assist level: Solves basic 25 - 49% of the time - needs direction more than half the time to initiate, plan or complete simple activities  Memory Memory assist level: Recognizes or recalls 25 - 49% of the time/requires cueing 50 - 75% of the time   Short Term Goals: Week 1: SLP Short Term Goal 1 (Week 1): Pt to demonstrate basic problem solving with min A of question/reasoning cueing. SLP Short Term Goal 2 (Week 1): Pt to  complete attention task to locate items in the environment with min A of verbal cueing. SLP Short Term Goal 3 (Week 1): Pt to demonstrate O x 4 at mod I level with use of environmental cues. SLP Short Term Goal 4 (Week 1): Pt to verbalize 2 safety precautions related to new limitations with min verbal cues. SLP Short Term Goal 5 (Week 1): Pt to demonstrate functional reading at the sentence level at mod I level for increased time/self-correction.  Refer to Care Plan for Long Term Goals  Recommendations for other services: None  Discharge Criteria: Patient will be discharged from SLP if patient refuses treatment 3 consecutive times without medical reason, if treatment goals not met, if there is a change in medical status, if patient makes no progress towards goals or if patient is discharged from hospital.  The above assessment, treatment plan, treatment alternatives and goals were discussed and mutually agreed upon: by patient and by family  Vinetta Bergamo 09/15/2015, 4:17 PM

## 2015-09-15 NOTE — Evaluation (Signed)
Occupational Therapy Assessment and Plan  Patient Details  Name: Andrea Keller MRN: [id number] Date of Birth: 06-Oct-1945  OT Diagnosis: apraxia, cognitive deficits and disturbance of vision Rehab Potential: Rehab Potential (ACUTE ONLY): Good ELOS: 5-7 days   Today's Date: 09/15/2015 OT Individual Time: 7371-0626 OT Individual Time Calculation (min): 65 min     Problem List:  Patient Active Problem List   Diagnosis Date Noted  . ICH (intracerebral hemorrhage) ([place]) 09/14/2015  . Transient alteration of awareness   . Other vascular headache   . Brain aneurysm   . Benign essential HTN   . Acute blood loss anemia   . Elevated troponin   . Essential hypertension   . HLD (hyperlipidemia)   . Malignant neoplasm of colon ([place])   . 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 Intraparenchymal hemorrhage of brain ([place]) 09/10/2015  . Microcytosis   . PVD (peripheral vascular disease) ([place])   . Hypertension   . 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 Thrombocytopenia ([place]) 11/08/2014  . Acute appendicitis 10/28/2014  . Perforated appendicitis 10/28/2014    Past Medical History:  Past Medical History  Diagnosis Date  . Diverticulosis   . Hyperplastic colon polyp   . Insomnia   . Microcytosis   . Osteoarthritis   . PVD (peripheral vascular disease) ([place])   . Multiple thyroid nodules   . Hypertension   . 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 Cyst removal neck    . Abdominal hysterectomy  2009  . Laparoscopic appendectomy N/A 10/28/2014    Procedure: APPENDECTOMY LAPAROSCOPIC;  Surgeon: Erroll Luna, MD;  Location: [place];  Service: General;  Laterality: N/A;  . Appendectomy  10/28/2014    Assessment & Plan Clinical Impression:  Andrea Keller is a 70 y.o. 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 Patient lives with spouse and assistance is needed. Independent prior to admission still driving. One level home with 4 steps to entry. Presented 09/10/2015 with persistent frontal headaches and generalized weakness as well as difficulty in concentrating. She did have a recent fall 09/09/2015. Her head on a dresser. CT of the head showed a 2.5 cm hemorrhage within the right frontal lobe with extensive white matter hypodensity throughout the right cerebral hemisphere. MRI completed showed right frontal lobe 5.3 x 3.3 cm hemorrhage with disproportionate marginal enhancement and mass effect/right frontoparietal subarachnoid hemorrhage. EEG negative seizure. CT of the chest negative. 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 Sabas Sous as an outpatient. Neurology consulted plan repeat MRI in 2-4 weeks for ongoing evaluation of either hemorrhagic metastasis or hemorrhagic infarct. Decadron protocol. Mildly elevated troponins with diffuse T-wave inversions likely due to [place]. Echocardiogram 09/13/2015 with ejection fraction of 94% grade 1 diastolic dysfunction. No cardiac source of emboli. Tolerating a regular diet. Physical and Occupational therapy evaluations completed with recommendations of physical medicine rehabilitation consult.Patient was admitted for a comprehensive rehabilitation program.  Patient transferred to CIR on 09/14/2015 .    Patient currently requires mod with basic self-care skills secondary to decreased visual perceptual skills, decreased visual motor skills and field cut, decreased attention to left and decreased motor planning and decreased attention, decreased awareness, decreased problem solving, decreased safety awareness and delayed processing.  Prior to hospitalization, patient was fully independent and driving.   Patient will benefit from skilled  intervention to increase independence with basic self-care skills and increase level of independence with iADL prior to discharge home with care partner.  Anticipate patient will require 24 hour supervision and follow up home health.  OT - End of Session Activity Tolerance: Tolerates 30+ min activity without fatigue Endurance Deficit: No OT Assessment Rehab Potential (ACUTE ONLY): Good OT Patient demonstrates impairments in the following area(s): Cognition;Perception;Safety;Motor OT Basic ADL's Functional Problem(s): Grooming;Bathing;Dressing;Toileting OT Advanced ADL's Functional Problem(s): Simple Meal Preparation;Light Housekeeping;Laundry OT Plan OT Intensity: Minimum of 1-2 x/day, 45 to 90 minutes OT Frequency: 5 out of 7 days OT Duration/Estimated Length of Stay: 5-7 days OT Treatment/Interventions: Cognitive remediation/compensation;Discharge planning;Patient/family education;Self Care/advanced ADL retraining;Therapeutic Activities;Visual/perceptual remediation/compensation OT Self Feeding Anticipated Outcome(s): set up  OT Basic Self-Care Anticipated Outcome(s): supervision OT Toileting Anticipated Outcome(s): supervision OT Bathroom Transfers Anticipated Outcome(s): supervision OT Recommendation Patient destination: Home Follow Up Recommendations: Home health OT Equipment Recommended: Tub/shower seat   Skilled Therapeutic Intervention Pt seen for initial evaluation and ADL retraining. Pt's daughter present and participated in session. Daughter cleared on safety plan to assist her mother as needed with ambulation in room and to the bathroom.  Pt very anxious to get up and get moving as she is very active. Pt demonstrated inconsistent levels of L neglect, L incoordination throughout the session.  She was able to actively use L hand to doff socks and wash body without cues, but later had extreme difficulty manipulating toiletries for oral care. She had difficulty negotiating how to  get into shower and demonstrate mod impaired dressing praxis with spatial relations difficulties with donning clothes. Reviewed with pt and dtr her deficits, OT goals and POC to improve her independence. Pt in room with daughter and all needs met.   OT Evaluation Precautions/Restrictions  Precautions Precautions: Fall Restrictions Weight Bearing Restrictions: No  Pain Pain Assessment Pain Assessment: No/denies pain Home Living/Prior Functioning Home Living Family/patient expects to be discharged to:: Private residence Living Arrangements: Spouse/significant other, Children Available Help at Discharge: Family, Available 24 hours/day Type of Home: House Home Access: Stairs to enter Technical brewer of Steps: 3 Entrance Stairs-Rails: None Home Layout: One level Bathroom Shower/Tub: Optometrist: Yes  Lives With: Spouse, Son Prior Function Level of Independence: Independent with basic ADLs, Independent with gait, Independent with transfers, Independent with homemaking with ambulation  Able to Take Stairs?: Yes Driving: Yes Vocation: Retired Leisure: Hobbies-yes (Comment) Comments: cleaning ADL ADL ADL Comments: Refer to functional navigator Vision/Perception  Vision- History Baseline Vision/History: Wears glasses Wears Glasses: At all times Patient Visual Report: No change from baseline Vision- Assessment Vision Assessment?: Yes Eye Alignment: Within Functional Limits Ocular Range of Motion: Restricted on the left Alignment/Gaze Preference: Gaze right Tracking/Visual Pursuits: Decreased smoothness of horizontal tracking;Decreased smoothness of vertical tracking;Left eye does not track laterally;Right eye does not track laterally;Requires cues, head turns, or add eye shifts to track (limited tracking to the L) Saccades: Additional head turns occurred during testing Convergence: Within functional limits Visual  Fields: Left homonymous hemianopsia Depth Perception: Overshoots  Cognition Overall Cognitive Status: Impaired/Different from baseline Arousal/Alertness: Awake/alert Orientation Level: Person;Place;Situation Person: Oriented Place: Oriented Situation: Oriented Year: 2017 Month: March Day of Week: Correct Memory: Appears intact Immediate Memory Recall: Sock;Blue;Bed Memory Recall: Sock;Blue;Bed Memory Recall Sock: Without Cue Memory Recall Blue: Without Cue Memory Recall Bed: Without Cue Attention: Sustained Sustained Attention: Impaired Sustained Attention  Impairment: Verbal basic;Functional basic Awareness: Impaired Awareness Impairment: Emergent impairment;Intellectual impairment Problem Solving: Impaired Problem Solving Impairment: Verbal basic;Functional basic Executive Function: Organizing;Sequencing Sequencing: Impaired Sequencing Impairment: Manufacturing systems engineer: Impaired Organizing Impairment: Functional basic Behaviors: Restless (pt is very "active" and is used to being on her feet much of the day) Sensation Sensation Light Touch: Appears Intact Stereognosis: Impaired by gross assessment (on the left hand only) Hot/Cold: Appears Intact Proprioception: Impaired Detail Proprioception Impaired Details: Impaired LUE;Impaired LLE Coordination Gross Motor Movements are Fluid and Coordinated: Yes Fine Motor Movements are Fluid and Coordinated: No (pt had difficulty opening and applying toothpaste) Finger Nose Finger Test: 7x in 10 sec on the R, 6 x on the L , slow movement but accurate Heel Shin Test: required max multimodal cues to complete task, [facility] for task Motor  Motor Motor: Motor apraxia;Motor perseverations Motor - Skilled Clinical Observations: pt needs mod cues at times on how to position body for transfers into tight spaces such as the shower, pt would hold onto objects  Mobility  Bed Mobility Bed Mobility: Rolling Right;Rolling Left;Sit to  Supine;Supine to Sit Rolling Right: 5: Supervision Rolling Left: 5: Supervision Supine to Sit: 5: Supervision Sit to Supine: 5: Supervision  Trunk/Postural Assessment  Cervical Assessment Cervical Assessment: Exceptions to [facility] (slight forward head) Thoracic Assessment Thoracic Assessment: Exceptions to [facility] (slight kyphosis) Lumbar Assessment Lumbar Assessment: Exceptions to [facility] (posterior pelvic tilt) Postural Control Postural Control: Deficits on evaluation Protective Responses: delayed  Balance Balance Balance Assessed: Yes Standardized Balance Assessment Standardized Balance Assessment: Berg Balance Test Berg Balance Test Sit to Stand: Able to stand without using hands and stabilize independently Standing Unsupported: Able to stand safely 2 minutes Sitting with Back Unsupported but Feet Supported on Floor or Stool: Able to sit safely and securely 2 minutes Stand to Sit: Sits safely with minimal use of hands Transfers: Able to transfer safely, minor use of hands Standing Unsupported with Eyes Closed: Able to stand 10 seconds safely Standing Ubsupported with Feet Together: Able to place feet together independently and stand 1 minute safely From Standing, Reach Forward with Outstretched Arm: Can reach forward >12 cm safely (5") From Standing Position, Pick up Object from Floor: Able to pick up shoe, needs supervision From Standing Position, Turn to Look Behind Over each Shoulder: Looks behind one side only/other side shows less weight shift Turn 360 Degrees: Able to turn 360 degrees safely but slowly Standing Unsupported, Alternately Place Feet on Step/Stool: Able to stand independently and complete 8 steps >20 seconds Standing Unsupported, One Foot in Front: Able to plae foot ahead of the other independently and hold 30 seconds Standing on One Leg: Able to lift leg independently and hold equal to or more than 3 seconds Total Score: 47 Static Standing Balance Static Standing -  Balance Support: No upper extremity supported Static Standing - Level of Assistance: 6: Modified independent (Device/Increase time) Dynamic Standing Balance Dynamic Standing - Balance Support: During functional activity;No upper extremity supported Dynamic Standing - Level of Assistance: 5: Stand by assistance Extremity/Trunk Assessment RUE Assessment RUE Assessment: Within Functional Limits (4-/5 throughout) LUE Assessment LUE Assessment: Exceptions to [facility] (full AROM with 4-/5 strength, but impaired awareness/proprioception)   See Function Navigator for Current Functional Status.   Refer to Care Plan for Long Term Goals  Recommendations for other services: None  Discharge Criteria: Patient will be discharged from OT if patient refuses treatment 3 consecutive times without medical reason, if treatment goals not met, if there is a change  in medical status, if patient makes no progress towards goals or if patient is discharged from hospital.  The above assessment, treatment plan, treatment alternatives and goals were discussed and mutually agreed upon: by patient and by family  Burnie Hank 09/15/2015, 1:00 PM

## 2015-09-15 NOTE — Progress Notes (Signed)
Social Work  Social Work Assessment and Plan  Patient Details  Name: Andrea Keller MRN: [id number] Date of Birth: Aug 16, 1945  Today's Date: 09/15/2015  Problem List:  Patient Active Problem List   Diagnosis Date Noted  . ICH (intracerebral hemorrhage) ([place]) 09/14/2015  . Transient alteration of awareness   . Other vascular headache   . Brain aneurysm   . Benign essential HTN   . Acute blood loss anemia   . Elevated troponin   . Essential hypertension   . HLD (hyperlipidemia)   . Malignant neoplasm of colon ([place])   . 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 Intraparenchymal hemorrhage of brain ([place]) 09/10/2015  . Microcytosis   . PVD (peripheral vascular disease) ([place])   . Hypertension   . 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 Thrombocytopenia ([place]) 11/08/2014  . Acute appendicitis 10/28/2014  . Perforated appendicitis 10/28/2014   Past Medical History:  Past Medical History  Diagnosis Date  . Diverticulosis   . Hyperplastic colon polyp   . Insomnia   . Microcytosis   . Osteoarthritis   . PVD (peripheral vascular disease) ([place])   . Multiple thyroid nodules   . Hypertension   . 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 Cyst removal neck    . Abdominal hysterectomy  2009  . Laparoscopic appendectomy N/A 10/28/2014    Procedure: APPENDECTOMY LAPAROSCOPIC;  Surgeon: Erroll Luna, MD;  Location: [facility];  Service: General;  Laterality: N/A;  . Appendectomy  10/28/2014   Social History:  reports that she has quit smoking. She has never used smokeless tobacco. She reports that she does not drink alcohol or use illicit drugs.  Family / Support Systems Marital Status: Married Patient Roles: Spouse, Parent, Caregiver Spouse/Significant OtherDarlin Drop  [phone] Children: Daughter and son in the home-with cognitive deficits Other Supports: Friends and church members Anticipated  Caregiver: John Ability/Limitations of Caregiver: Husband is in good health, but is out of the home at times running errands Caregiver Availability: 24/7 Family Dynamics: Close knit family who are very supportive of one another. Their son whom lives with them is a HD pt and has some cognitive issues, so needs supervision, which pt provided prior to admission. Daughter is supportive and involved but also works. Daughter is here today to observe in therapies.  Social History Preferred language: English Religion: Baptist Cultural Background: No issues Education: [employer] Read: Yes Write: Yes Employment Status: Retired Freight forwarder Issues: No issues Guardian/Conservator: None-according to MD pt is not capable to make decisions while here. Will look toward husband since he is her next of kin to make any decision while here.   Abuse/Neglect Physical Abuse: Denies Verbal Abuse: Denies Sexual Abuse: Denies Exploitation of patient/patient's resources: Denies Self-Neglect: Denies  Emotional Status Pt's affect, behavior adn adjustment status: Pt is motivated to do well and regain her independence before leaving here. Her daughter confirms Mom is self sufficient and has always been the one to take care of others. between she and her Dad someone will be with Mom at discharge. Recent Psychosocial Issues: other health issues-was doing well from her cancer treatments last year. Pyschiatric History: No history deferred depression due to pt seems to be coping appropriately with her hospitalization. She is still clearing cognitively, according to her daughter. Will monitor and intervene if team feels neuro-psych needed prior to discharge. Pt is still adjusting to the rehab unit. Substance Abuse  History: No issues  Patient / Family Perceptions, Expectations & Goals Pt/Family understanding of illness & functional limitations: Pt and daughter can explain her injuries and fall. Daughter and  husband have spoken with the MD and feel their questions and concerns are being addressed. They plan to stay involved while pt is here. Premorbid pt/family roles/activities: Wife, Mother, grandmother, church member, retiree, caregiver,etc Anticipated changes in roles/activities/participation: resume Pt/family expectations/goals: Pt states: I want to get walking while here."  Daughter states: " I hope she gets steady on her feet and more mentally clear."  [employer]: None Premorbid Home Care/DME Agencies: None Transportation available at discharge: [employer] referrals recommended: Support group (specify)  Discharge Planning Living Arrangements: Spouse/significant other, Children Support Systems: Spouse/significant other, Children, Friends/neighbors, Immunologist, Other relatives Type of Residence: Private residence Insurance Resources: Multimedia programmer (specify) Primary school teacher) Financial Resources: Fish farm manager, Family Support Financial Screen Referred: No Living Expenses: Lives with family Money Management: Spouse Does the patient have any problems obtaining your medications?: No Home Management: Patient & husband share in the home management Patient/Family Preliminary Plans: Return home with husband and son, husband can provide almost 24 hr needs to leave at times to run errands. Their son is a HD pt and is there 3x week. Daughter is willing to assist but works and has her own family. Will need to await team evalutations and develop a safe discharge plan for pt. Social Work Anticipated Follow Up Needs: HH/OP, Support Group  Clinical Impression Pleasant female who is willing to work in therapies and regain her independence while here. She is aware of her cognitive deficits and wants to learn how to compensate for theses. Her family is involved and supportive And willing to assist her at discharge. Team is evaluating her and setting goals.  Will work on a safe discharge plan, both she and daughter aware pt may require 24 hr supervision at discharge from rehab if have not cleared cognitively for safety At home.  Elease Hashimoto 09/15/2015, 12:19 PM

## 2015-09-15 NOTE — Care Management Note (Signed)
Inpatient [facility] Individual Statement of Services  Patient Name:  Andrea Keller  Date:  09/15/2015  Welcome to the [facility].  Our goal is to provide you with an individualized program based on your diagnosis and situation, designed to meet your specific needs.  With this comprehensive rehabilitation program, you will be expected to participate in at least 3 hours of rehabilitation therapies Monday-Friday, with modified therapy programming on the weekends.  Your rehabilitation program will include the following services:  Physical Therapy (PT), Occupational Therapy (OT), Speech Therapy (ST), 24 hour per day rehabilitation nursing, Therapeutic Recreaction (TR), Case Management (Social Worker), Rehabilitation Medicine, Nutrition Services and Pharmacy Services  Weekly team conferences will be held on Wednesday to discuss your progress.  Your Social Worker will talk with you frequently to get your input and to update you on team discussions.  Team conferences with you and your family in attendance may also be held.  Expected length of stay: 7-9 days Overall anticipated outcome: supervision level due to cognition  Depending on your progress and recovery, your program may change. Your Social Worker will coordinate services and will keep you informed of any changes. Your Social Worker's name and contact numbers are listed  below.  The following services may also be recommended but are not provided by the [facility]:    [facility] will be made to provide these services after discharge if needed.  Arrangements include referral to agencies that provide these services.  Your insurance has been verified to be:  [place] Your primary doctor is:  Andrea Keller  Pertinent information will be shared with your doctor and your insurance company.  Social  Worker:  Ovidio Kin, [place] or ([phone]  Information discussed with and copy given to patient by: Elease Hashimoto, 09/15/2015, 10:03 AM

## 2015-09-15 NOTE — Progress Notes (Signed)
70 y.o. 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 Patient lives with spouse and assistance is needed. Independent prior to admission still driving. One level home with 4 steps to entry. Presented 09/10/2015 with persistent frontal headaches and generalized weakness as well as difficulty in concentrating. She did have a recent fall 09/09/2015. Her head on a dresser. CT of the head showed a 2.5 cm hemorrhage within the right frontal lobe with extensive white matter hypodensity throughout the right cerebral hemisphere. MRI completed showed right frontal lobe 5.3 x 3.3 cm hemorrhage with disproportionate marginal enhancement and mass effect/right frontoparietal subarachnoid hemorrhage. EEG negative seizure. CT of the chest negative. 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 Sabas Sous as an outpatient. Neurology consulted plan repeat MRI in 2-4 weeks for ongoing evaluation of either hemorrhagic metastasis or hemorrhagic infarct  Subjective/Complaints: No issues overnite, eating well no HA  ROS:  No CP, SOB, N/V/D  Objective: Vital Signs: Blood pressure 153/67, pulse 54, temperature 97.5 F (36.4 C), temperature source Oral, resp. rate 16, height 5\' 4"  (1.626 m), weight 61.326 kg (135 lb 3.2 oz), SpO2 100 %. No results found. Results for orders placed or performed during the hospital encounter of 09/10/15 (from the past 72 hour(s))  Troponin I     Status: Abnormal   Collection Time: 09/12/15 12:23 PM  Result Value Ref Range   Troponin I 0.10 (H) <0.031 ng/mL    Comment:        PERSISTENTLY INCREASED TROPONIN VALUES IN THE RANGE OF 0.04-0.49 ng/mL CAN BE SEEN IN:       -UNSTABLE ANGINA       -CONGESTIVE HEART FAILURE       -MYOCARDITIS       -CHEST  TRAUMA       -ARRYHTHMIAS       -LATE PRESENTING MYOCARDIAL INFARCTION       -COPD   CLINICAL FOLLOW-UP RECOMMENDED.   Troponin I     Status: Abnormal   Collection Time: 09/12/15  5:59 PM  Result Value Ref Range   Troponin I 0.09 (H) <0.031 ng/mL    Comment:        PERSISTENTLY INCREASED TROPONIN VALUES IN THE RANGE OF 0.04-0.49 ng/mL CAN BE SEEN IN:       -UNSTABLE ANGINA       -CONGESTIVE HEART FAILURE       -MYOCARDITIS       -CHEST TRAUMA       -ARRYHTHMIAS       -LATE PRESENTING MYOCARDIAL INFARCTION       -COPD   CLINICAL FOLLOW-UP RECOMMENDED.   Troponin I     Status: Abnormal   Collection Time: 09/13/15 12:22 AM  Result Value Ref Range   Troponin I 0.08 (H) <0.031 ng/mL    Comment:        PERSISTENTLY INCREASED TROPONIN VALUES IN THE RANGE OF 0.04-0.49 ng/mL CAN BE SEEN IN:       -UNSTABLE ANGINA       -CONGESTIVE HEART FAILURE       -MYOCARDITIS       -CHEST TRAUMA       -ARRYHTHMIAS       -LATE PRESENTING MYOCARDIAL INFARCTION       -COPD   CLINICAL FOLLOW-UP RECOMMENDED.   Troponin I  Status: Abnormal   Collection Time: 09/13/15  9:11 AM  Result Value Ref Range   Troponin I 0.10 (H) <0.031 ng/mL    Comment:        PERSISTENTLY INCREASED TROPONIN VALUES IN THE RANGE OF 0.04-0.49 ng/mL CAN BE SEEN IN:       -UNSTABLE ANGINA       -CONGESTIVE HEART FAILURE       -MYOCARDITIS       -CHEST TRAUMA       -ARRYHTHMIAS       -LATE PRESENTING MYOCARDIAL INFARCTION       -COPD   CLINICAL FOLLOW-UP RECOMMENDED.   Troponin I     Status: Abnormal   Collection Time: 09/13/15 12:35 PM  Result Value Ref Range   Troponin I 0.14 (H) <0.031 ng/mL    Comment:        PERSISTENTLY INCREASED TROPONIN VALUES IN THE RANGE OF 0.04-0.49 ng/mL CAN BE SEEN IN:       -UNSTABLE ANGINA       -CONGESTIVE HEART FAILURE       -MYOCARDITIS       -CHEST TRAUMA       -ARRYHTHMIAS       -LATE PRESENTING MYOCARDIAL INFARCTION       -COPD   CLINICAL FOLLOW-UP  RECOMMENDED.   Troponin I     Status: Abnormal   Collection Time: 09/13/15  7:20 PM  Result Value Ref Range   Troponin I 0.11 (H) <0.031 ng/mL    Comment:        PERSISTENTLY INCREASED TROPONIN VALUES IN THE RANGE OF 0.04-0.49 ng/mL CAN BE SEEN IN:       -UNSTABLE ANGINA       -CONGESTIVE HEART FAILURE       -MYOCARDITIS       -CHEST TRAUMA       -ARRYHTHMIAS       -LATE PRESENTING MYOCARDIAL INFARCTION       -COPD   CLINICAL FOLLOW-UP RECOMMENDED.   Troponin I     Status: Abnormal   Collection Time: 09/13/15 11:52 PM  Result Value Ref Range   Troponin I 0.11 (H) <0.031 ng/mL    Comment:        PERSISTENTLY INCREASED TROPONIN VALUES IN THE RANGE OF 0.04-0.49 ng/mL CAN BE SEEN IN:       -UNSTABLE ANGINA       -CONGESTIVE HEART FAILURE       -MYOCARDITIS       -CHEST TRAUMA       -ARRYHTHMIAS       -LATE PRESENTING MYOCARDIAL INFARCTION       -COPD   CLINICAL FOLLOW-UP RECOMMENDED.   Troponin I     Status: Abnormal   Collection Time: 09/14/15  5:52 AM  Result Value Ref Range   Troponin I 0.10 (H) <0.031 ng/mL    Comment:        PERSISTENTLY INCREASED TROPONIN VALUES IN THE RANGE OF 0.04-0.49 ng/mL CAN BE SEEN IN:       -UNSTABLE ANGINA       -CONGESTIVE HEART FAILURE       -MYOCARDITIS       -CHEST TRAUMA       -ARRYHTHMIAS       -LATE PRESENTING MYOCARDIAL INFARCTION       -COPD   CLINICAL FOLLOW-UP RECOMMENDED.   Troponin I     Status: Abnormal   Collection Time: 09/14/15  1:45 PM  Result Value Ref Range  Troponin I 0.09 (H) <0.031 ng/mL    Comment:        PERSISTENTLY INCREASED TROPONIN VALUES IN THE RANGE OF 0.04-0.49 ng/mL CAN BE SEEN IN:       -UNSTABLE ANGINA       -CONGESTIVE HEART FAILURE       -MYOCARDITIS       -CHEST TRAUMA       -ARRYHTHMIAS       -LATE PRESENTING MYOCARDIAL INFARCTION       -COPD   CLINICAL FOLLOW-UP RECOMMENDED.       General: No acute distress Mood and affect are appropriate Heart: Regular rate and rhythm no  rubs murmurs or extra sounds Lungs: Clear to auscultation, breathing unlabored, no rales or wheezes Abdomen: Positive bowel sounds, soft nontender to palpation, nondistended Extremities: No clubbing, cyanosis, or edema Skin: No evidence of breakdown, no evidence of rash Neurologic: Cranial nerves II through XII intact, motor strength is 5/5 in RIght and 3- Left deltoid, bicep, tricep, grip, 5/5 bilateral hip flexor, knee extensors, ankle dorsiflexor and plantar flexor Sensory exam normal sensation to light touch and proprioception in bilateral upper and lower extremities Cerebellar exam normal finger to nose to finger as well as heel to shin in bilateral upper and lower extremities Musculoskeletal: Full range of motion in all 4 extremities. No joint swelling   Assessment/Plan: 1. 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 Physiatrist is providing close team supervision and 24 hour management of active medical problems listed below. Physiatrist and rehab team continue to assess barriers to discharge/monitor patient progress toward functional and medical goals. FIM:                                  Medical Problem List and Plan: 1.  Persistent frontal headaches generalized weakness with altered mental status secondary to traumatic ICH- intiate rehab 2.  DVT Prophylaxis/Anticoagulation: SCDs. Monitor for any signs of DVT 3. Pain Management: Percocet as needed 4. 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 Follow-up outpatient interventional radiology Dr. [name] 5. Neuropsych: This patient is not capable of making decisions on her own behalf. 6. Skin/Wound Care: Routine skin checks 7. Fluids/Electrolytes/Nutrition: Routine eye and nose with follow-up chemistries 8. History of goblet cell carcinoid status post hemicolectomy. [facility] post d/c. 9. Hyperlipidemia. Crestor 10. HTN: monitor and provide prns in accordance with increased physical exertion and pain 11. ABLA: Monitor and transfuse if necessary 12. Thrombocytopenia: Cont to follow platelets   LOS (Days) 1 A FACE TO FACE EVALUATION WAS PERFORMED  KIRSTEINS,ANDREW E 09/15/2015, 7:55 AM

## 2015-09-16 ENCOUNTER — Inpatient Hospital Stay (HOSPITAL_COMMUNITY): Payer: Medicare Other | Admitting: Occupational Therapy

## 2015-09-16 ENCOUNTER — Inpatient Hospital Stay (HOSPITAL_COMMUNITY): Payer: Medicare Other | Admitting: Speech Pathology

## 2015-09-16 ENCOUNTER — Inpatient Hospital Stay (HOSPITAL_COMMUNITY): Payer: Medicare Other | Admitting: Physical Therapy

## 2015-09-16 LAB — URINALYSIS, ROUTINE W REFLEX MICROSCOPIC
BILIRUBIN URINE: NEGATIVE
Glucose, UA: NEGATIVE mg/dL
HGB URINE DIPSTICK: NEGATIVE
KETONES UR: NEGATIVE mg/dL
Leukocytes, UA: NEGATIVE
NITRITE: NEGATIVE
Protein, ur: NEGATIVE mg/dL
Specific Gravity, Urine: 1.019 (ref 1.005–1.030)
pH: 5 (ref 5.0–8.0)

## 2015-09-16 NOTE — Progress Notes (Signed)
Occupational Therapy Session Note  Patient Details  Name: Andrea Keller MRN: [id number] Date of Birth: 02/16/1946  Today's Date: 09/16/2015 OT Individual Time: [id number] OT Individual Time Calculation (min): 58 min    Short Term Goals: Week 1:  OT Short Term Goal 1 (Week 1): STGs = LTGs  Skilled Therapeutic Interventions/Progress Updates:    Pt ambulated from her room to the [facility] gym.  She needed max instructional cueing to locate the doorway of the room on the left side as she walked past it without scanning.  Once in the room had pt perform 5 intervals in standing using the [employer].  She demonstrated consistent slower reaction times with locating buttons on the left side of midline, ranging from 10 seconds initially down to 4 seconds with practice using circular scanning pattern.  Transitioned to having pt scan on table top to pick up puzzle pieces and place in plastic bag using both UEs.  She was able to locate 100% of puzzle pieces on the left side as well.  Once she finished had her ambulate down the hall and toward the therapy gym.  Decreased ability to discriminate between left and right as pt turned right after passing the nurses station instead of left, which was the direction the therapist told her to go.  Had her locate chairs in the hallway to sit on which she was able to do,on the left side.  Gave her 2 items to find in the gym as well after completing rest break.  She demonstrated moderate difficulty locating items on the left side and needed max instructional cueing to find them.  Ambulated back to room to finish session with pt being able to state room number, but not being able to make the correct left turns to walk the direction needed or locate the actual room which was on the left side.  Even though she stated that is was room 25 she stopped off at room 18 as she thought she recognized some family it it.  Mod re-direction that it was not her room.  She did not locate her room on  the left side.  She needed to turn around and therapist had to give her cueing for her to locate it then on the right side.  Discussed session with daughter who was in room as well at end of session.  Encouraged her to sit on pt's left side when possible and engage in conversation and have her locate items on that side as well to increase re-enforcement of scanning to the left.    Therapy Documentation Precautions:  Precautions Precautions: Fall Restrictions Weight Bearing Restrictions: No  Pain: Pain Assessment Pain Assessment: No/denies pain ADL: See Function Navigator for Current Functional Status.   Therapy/Group: Individual Therapy  Mycah Mcdougall OTR/L 09/16/2015, 4:33 PM

## 2015-09-16 NOTE — Progress Notes (Signed)
Speech Language Pathology Daily Session Note  Patient Details  Name: Andrea Keller MRN: [id number] Date of Birth: [birth year]  Today's Date: 09/16/2015 SLP Individual Time: 1000-1100 SLP Individual Time Calculation (min): 60 min  Short Term Goals: Week 1: SLP Short Term Goal 1 (Week 1): Pt to demonstrate basic problem solving with min A of question/reasoning cueing. SLP Short Term Goal 2 (Week 1): Pt to complete attention task to locate items in the environment with min A of verbal cueing. SLP Short Term Goal 3 (Week 1): Pt to demonstrate O x 4 at mod I level with use of environmental cues. SLP Short Term Goal 4 (Week 1): Pt to verbalize 2 safety precautions related to new limitations with min verbal cues. SLP Short Term Goal 5 (Week 1): Pt to demonstrate functional reading at the sentence level at mod I level for increased time/self-correction.  Skilled Therapeutic Interventions: Session focused on cognitive goals. Pt unable to verbalize understanding of deficits despite frequent review of concern for attention, particularly with L sided ATTN. Reviewed with pt's daughter that pt will require 24/7 supervision for safety and is unable to complete previous activities such as bill paying/med management. Pt's daughter verbalized understanding. Pt able to write name/address with 90%acc. Able to read at sentence level with mod- min A for attention to L. Simple reasoning/categorization activity completed with min A of clarification questions. Simple trail making activity required verbal/visual and tactile cueing to complete.   Function:  Eating Eating   Modified Consistency Diet: No Eating Assist Level: Set up assist for;Supervision or verbal cues (supervised by daughter)   Eating Set Up Assist For: Opening containers       Cognition Comprehension Comprehension assist level: Understands basic 90% of the time/cues < 10% of the time  Expression   Expression assist level: Expresses basic  needs/ideas: With extra time/assistive device  Social Interaction Social Interaction assist level: Interacts appropriately with others with medication or extra time (anti-anxiety, antidepressant).  Problem Solving Problem solving assist level: Solves basic 25 - 49% of the time - needs direction more than half the time to initiate, plan or complete simple activities  Memory Memory assist level: Recognizes or recalls 25 - 49% of the time/requires cueing 50 - 75% of the time    Pain Pain Assessment Pain Assessment: No/denies pain  Therapy/Group: Individual Therapy  Vinetta Bergamo 09/16/2015, 4:21 PM

## 2015-09-16 NOTE — Progress Notes (Signed)
Subjective/Complaints: Per daughter mildly confused, pacing in room last noc, pt states she has freq urination but no other c/os  ROS:  No CP, SOB, N/V/D  Objective: Vital Signs: Blood pressure 138/65, pulse 53, temperature 98 F (36.7 C), temperature source Oral, resp. rate 16, height 5\' 4"  (1.626 m), weight 62.333 kg (137 lb 6.7 oz), SpO2 100 %. No results found. Results for orders placed or performed during the hospital encounter of 09/10/15 (from the past 72 hour(s))  Troponin I     Status: Abnormal   Collection Time: 09/13/15  9:11 AM  Result Value Ref Range   Troponin I 0.10 (H) <0.031 ng/mL    Comment:        PERSISTENTLY INCREASED TROPONIN VALUES IN THE RANGE OF 0.04-0.49 ng/mL CAN BE SEEN IN:       -UNSTABLE ANGINA       -CONGESTIVE HEART FAILURE       -MYOCARDITIS       -CHEST TRAUMA       -ARRYHTHMIAS       -LATE PRESENTING MYOCARDIAL INFARCTION       -COPD   CLINICAL FOLLOW-UP RECOMMENDED.   Troponin I     Status: Abnormal   Collection Time: 09/13/15 12:35 PM  Result Value Ref Range   Troponin I 0.14 (H) <0.031 ng/mL    Comment:        PERSISTENTLY INCREASED TROPONIN VALUES IN THE RANGE OF 0.04-0.49 ng/mL CAN BE SEEN IN:       -UNSTABLE ANGINA       -CONGESTIVE HEART FAILURE       -MYOCARDITIS       -CHEST TRAUMA       -ARRYHTHMIAS       -LATE PRESENTING MYOCARDIAL INFARCTION       -COPD   CLINICAL FOLLOW-UP RECOMMENDED.   Troponin I     Status: Abnormal   Collection Time: 09/13/15  7:20 PM  Result Value Ref Range   Troponin I 0.11 (H) <0.031 ng/mL    Comment:        PERSISTENTLY INCREASED TROPONIN VALUES IN THE RANGE OF 0.04-0.49 ng/mL CAN BE SEEN IN:       -UNSTABLE ANGINA       -CONGESTIVE HEART FAILURE       -MYOCARDITIS       -CHEST TRAUMA       -ARRYHTHMIAS       -LATE PRESENTING MYOCARDIAL INFARCTION       -COPD   CLINICAL FOLLOW-UP RECOMMENDED.   Troponin I     Status: Abnormal   Collection Time: 09/13/15 11:52 PM  Result Value Ref  Range   Troponin I 0.11 (H) <0.031 ng/mL    Comment:        PERSISTENTLY INCREASED TROPONIN VALUES IN THE RANGE OF 0.04-0.49 ng/mL CAN BE SEEN IN:       -UNSTABLE ANGINA       -CONGESTIVE HEART FAILURE       -MYOCARDITIS       -CHEST TRAUMA       -ARRYHTHMIAS       -LATE PRESENTING MYOCARDIAL INFARCTION       -COPD   CLINICAL FOLLOW-UP RECOMMENDED.   Troponin I     Status: Abnormal   Collection Time: 09/14/15  5:52 AM  Result Value Ref Range   Troponin I 0.10 (H) <0.031 ng/mL    Comment:        PERSISTENTLY INCREASED TROPONIN VALUES IN THE RANGE OF 0.04-0.49 ng/mL  CAN BE SEEN IN:       -UNSTABLE ANGINA       -CONGESTIVE HEART FAILURE       -MYOCARDITIS       -CHEST TRAUMA       -ARRYHTHMIAS       -LATE PRESENTING MYOCARDIAL INFARCTION       -COPD   CLINICAL FOLLOW-UP RECOMMENDED.   Troponin I     Status: Abnormal   Collection Time: 09/14/15  1:45 PM  Result Value Ref Range   Troponin I 0.09 (H) <0.031 ng/mL    Comment:        PERSISTENTLY INCREASED TROPONIN VALUES IN THE RANGE OF 0.04-0.49 ng/mL CAN BE SEEN IN:       -UNSTABLE ANGINA       -CONGESTIVE HEART FAILURE       -MYOCARDITIS       -CHEST TRAUMA       -ARRYHTHMIAS       -LATE PRESENTING MYOCARDIAL INFARCTION       -COPD   CLINICAL FOLLOW-UP RECOMMENDED.       General: No acute distress Mood and affect are appropriate Heart: Regular rate and rhythm no rubs murmurs or extra sounds Lungs: Clear to auscultation, breathing unlabored, no rales or wheezes Abdomen: Positive bowel sounds, soft nontender to palpation, nondistended Extremities: No clubbing, cyanosis, or edema Skin: No evidence of breakdown, no evidence of rash Neurologic: Cranial nerves II through XII intact, motor strength is 5/5 in RIght and 3- Left deltoid, bicep, tricep, grip, 5/5 bilateral hip flexor, knee extensors, ankle dorsiflexor and plantar flexor Sensory exam normal sensation to light touch and proprioception in bilateral upper and  lower extremities Cerebellar exam normal finger to nose to finger as well as heel to shin in bilateral upper and lower extremities Musculoskeletal: Full range of motion in all 4 extremities. No joint swelling   Assessment/Plan: 1. 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 Physiatrist is providing close team supervision and 24 hour management of active medical problems listed below. Physiatrist and rehab team continue to assess barriers to discharge/monitor patient progress toward functional and medical goals. FIM: Function - Bathing Position: Shower Body parts bathed by patient: Right arm, Left arm, Chest, Abdomen, Front perineal area, Buttocks, Right upper leg, Left upper leg Body parts bathed by helper: Back, Left lower leg, Right lower leg  Function- Upper Body Dressing/Undressing What is the patient wearing?: Bra, Pull over shirt/dress Bra - Perfomed by helper: Thread/unthread right bra strap, Thread/unthread left bra strap, Hook/unhook bra (pull down sports bra) Pull over shirt/dress - Perfomed by patient: Put head through opening, Pull shirt over trunk Pull over shirt/dress - Perfomed by helper: Thread/unthread right sleeve, Thread/unthread left sleeve Function - Lower Body Dressing/Undressing What is the patient wearing?: Pants, Underwear, Non-skid slipper socks Position: Sitting EOB Underwear - Performed by patient: Pull underwear up/down Underwear - Performed by helper: Thread/unthread right underwear leg, Thread/unthread left underwear leg Pants- Performed by patient: Pull pants up/down Pants- Performed by helper: Thread/unthread right pants leg, Thread/unthread left pants leg Non-skid slipper socks- Performed by helper: Don/doff right sock, Don/doff left sock  Function - Toileting Toileting steps completed by patient: Adjust clothing prior to toileting,  Performs perineal hygiene, Adjust clothing after toileting Toileting Assistive Devices: Grab bar or rail Assist level: Supervision or verbal cues  Function - Air cabin crew transfer assistive device: Grab bar Assist level to  toilet: Supervision or verbal cues Assist level from toilet: Supervision or verbal cues  Function - Chair/bed transfer Chair/bed transfer method: Ambulatory Chair/bed transfer assist level: Supervision or verbal cues Chair/bed transfer assistive device: Armrests  Function - Locomotion: Wheelchair Will patient use wheelchair at discharge?: No Function - Locomotion: Ambulation Assistive device: No device Max distance: 300 Assist level: Supervision or verbal cues Assist level: Supervision or verbal cues Assist level: Supervision or verbal cues Assist level: Supervision or verbal cues  Function - Comprehension Comprehension: Auditory Comprehension assist level: Understands basic 90% of the time/cues < 10% of the time  Function - Expression Expression: Verbal Expression assist level: Expresses basic needs/ideas: With extra time/assistive device  Function - Social Interaction Social Interaction assist level: Interacts appropriately with others with medication or extra time (anti-anxiety, antidepressant).  Function - Problem Solving Problem solving assist level: Solves basic 25 - 49% of the time - needs direction more than half the time to initiate, plan or complete simple activities  Function - Memory Memory assist level: Recognizes or recalls 25 - 49% of the time/requires cueing 50 - 75% of the time Patient normally able to recall (first 3 days only): Current season, That he or she is in a hospital  Medical Problem List and Plan: 1.  Persistent frontal headaches left neglect and field cut generalized weakness with altered mental status secondary to traumatic ICH- Cont rehab 2.  DVT Prophylaxis/Anticoagulation: SCDs. Monitor for any signs of DVT 3.  Pain Management: Percocet as needed 4. 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 Follow-up outpatient interventional radiology Dr. [name] 5. Neuropsych: This patient is not capable of making decisions on her own behalf. 6. Skin/Wound Care: Routine skin checks 7. Fluids/Electrolytes/Nutrition: Routine eye and nose with follow-up chemistries 8. History of goblet cell carcinoid status post hemicolectomy. [facility] post d/c.Continent and regular with bowels 9. Hyperlipidemia. Crestor 10. HTN: monitor and provide prns in accordance with increased physical exertion and pain 11. ABLA: Monitor and transfuse if necessary 12. Thrombocytopenia: Cont to follow platelets 13.  Sundowning per daughter, did not occur at home, likely ICH related, no sig agitation noted expect improvement, given freq may have UTI will check UA  LOS (Days) 2 A FACE TO FACE EVALUATION WAS PERFORMED  KIRSTEINS,ANDREW E 09/16/2015, 8:11 AM

## 2015-09-16 NOTE — Progress Notes (Signed)
Physical Therapy Note  Patient Details  Name: AZANI STOCKARD MRN: [id number] Date of Birth: 08-05-1945 Today's Date: 09/16/2015    Time: [phone] 75 minutes  1:1 No c/o pain. Pt and daughter request that pt take a shower before PT treatment. PT checked schedule and noted no OT session until 1pm.  Pt doffed clothing with min A, cues for safety and sequencing and motor planning, cues to sit in chair before taking of pants and socks.  Shower transfer with steadying assist.  Pt able to perform washing with cues for sequencing, recall of what she had already washed.  Pt requires min A for dressing due to Lt perceptual deficits.  Pt supervision with gait around room and in hallway.  Pt performed gait on uneven surfaces x 20' with min A, 1 LOB with mod A to correct.  Standing balance and coordination activity with tapping numbers in 3 and 4 number sequence. Pt able to recall 3 number sequence 100%, 4 number sequence 75% of the time, min/mod A for balance and SLS.     Kaylin Schellenberg 09/16/2015, 3:06 PM

## 2015-09-17 ENCOUNTER — Inpatient Hospital Stay (HOSPITAL_COMMUNITY): Payer: Medicare Other | Admitting: *Deleted

## 2015-09-17 ENCOUNTER — Inpatient Hospital Stay (HOSPITAL_COMMUNITY): Payer: Medicare Other | Admitting: Occupational Therapy

## 2015-09-17 ENCOUNTER — Inpatient Hospital Stay (HOSPITAL_COMMUNITY): Payer: Medicare Other | Admitting: Speech Pathology

## 2015-09-17 DIAGNOSIS — I612 Nontraumatic intracerebral hemorrhage in hemisphere, unspecified: Secondary | ICD-10-CM

## 2015-09-17 LAB — URINE CULTURE: CULTURE: NO GROWTH

## 2015-09-17 NOTE — Progress Notes (Signed)
Occupational Therapy Session Note  Patient Details  Name: Andrea Keller MRN: [id number] Date of Birth: 1945-11-11  Today's Date: 09/17/2015 OT Individual Time: 1000-1100 OT Individual Time Calculation (min): 60 min    Short Term Goals: Week 1:  OT Short Term Goal 1 (Week 1): STGs = LTGs     Skilled Therapeutic Interventions/Progress Updates:    Pt. Agreed to shower.  Ambulated to shower stall with min assist.  Transferred to tub bench with cues for positioning and placement.  Daughter had set up with clothes. Pt bathed self with min assist for balance and sit to stand in shower.  She needed assistance for organizing and sequencing.  She completed dressing with assistance for bra and pants.  Pt completed bathing and dressing and ambulated back to wc. Pt. Left with call bell, phone and all needs in reach.     Therapy Documentation Precautions:  Precautions Precautions: Fall Restrictions Weight Bearing Restrictions: No    Pain: Pain Assessment Pain Assessment: No/denies pain ADL: ADL ADL Comments: Refer to functional navigator   See Function Navigator for Current Functional Status.   Therapy/Group: Individual Therapy  Lisa Roca 09/17/2015, 6:00 PM

## 2015-09-17 NOTE — IPOC Note (Signed)
Overall Plan of Care [facility]) Patient Details Name: Andrea Keller MRN: [id number] DOB: [birth year]  Admitting Diagnosis: traumatic ICH    Hospital Problems: Active Problems:   ICH (intracerebral hemorrhage) (HCC)   Transient alteration of awareness   Other vascular headache   Brain aneurysm   Benign essential HTN     Functional Problem List: Nursing Bowel, Endurance, Medication Management, Safety, Motor, Bladder  PT Balance, Behavior, Motor, Nutrition, Perception, Safety  OT Cognition, Perception, Safety, Motor  SLP Cognition  TR         Basic ADL's: OT Grooming, Bathing, Dressing, Toileting     Advanced  ADL's: OT Simple Meal Preparation, Light Housekeeping, Laundry     Transfers: PT Bed Mobility, Bed to Chair, Musician, [employer], Floor  OT       Locomotion: PT Ambulation, Stairs     Additional Impairments: OT    SLP Social Cognition   Problem Solving, Memory, Attention, Awareness  TR      Anticipated Outcomes Item Anticipated Outcome  Self Feeding set up   Swallowing      Basic self-care  supervision  Toileting  supervision   Bathroom Transfers supervision  Bowel/Bladder  Mod I  Transfers  mod I  Locomotion  mod I household, supervision community  Communication     Cognition  min A for basic tasks  Pain  < 3  Safety/Judgment  Supervision   Therapy Plan: PT Intensity: Minimum of 1-2 x/day ,45 to 90 minutes PT Frequency: 5 out of 7 days PT Duration Estimated Length of Stay: 5-7 days OT Intensity: Minimum of 1-2 x/day, 45 to 90 minutes OT Frequency: 5 out of 7 days OT Duration/Estimated Length of Stay: 5-7 days SLP Intensity: Minumum of 1-2 x/day, 30 to 90 minutes SLP Frequency: 3 to 5 out of 7 days SLP Duration/Estimated Length of Stay: 7-9 days       Team Interventions: Nursing Interventions Patient/Family Education, Bowel Management, Bladder Management, Disease Management/Prevention, Medication Management, Dysphagia/Aspiration Precaution  Training  PT interventions Ambulation/gait training, Training and development officer, Cognitive remediation/compensation, Community reintegration, Discharge planning, Disease management/prevention, Functional mobility training, Neuromuscular re-education, Pain management, Patient/family education, Psychosocial support, Stair training, Therapeutic Activities, Therapeutic Exercise, Visual/perceptual remediation/compensation, UE/LE Coordination activities, UE/LE Strength taining/ROM  OT Interventions Cognitive remediation/compensation, Discharge planning, Patient/family education, Self Care/advanced ADL retraining, Therapeutic Activities, Visual/perceptual remediation/compensation  SLP Interventions Cognitive remediation/compensation, Cueing hierarchy, Functional tasks, Environmental controls, Therapeutic Activities, Patient/family education  TR Interventions    SW/CM Interventions Discharge Planning, Psychosocial Support, Patient/Family Education    Team Discharge Planning: Destination: PT-Home ,OT- Home , SLP-Home Projected Follow-up: PT-24 hour supervision/assistance, None, OT-  Home health OT, SLP-Outpatient SLP, 24 hour supervision/assistance Projected Equipment Needs: PT-None recommended by PT, OT- Tub/shower seat, SLP-None recommended by SLP Equipment Details: PT- , OT-  Patient/family involved in discharge planning: PT- Patient, Family member/caregiver,  OT-Patient, Family member/caregiver, SLP-Patient, Family member/caregiver  MD ELOS: 7 days Medical Rehab Prognosis:  Excellent Assessment: The patient has been admitted for CIR therapies with the diagnosis of ICH. The team will be addressing functional mobility, strength, stamina, balance, safety, adaptive techniques and equipment, self-care, bowel and bladder mgt, patient and caregiver education, NMR, visual-perceptual awareness, ego support, community reintegration. Goals have been set at supervision for most OT tasks and mod I for basic mobilty  and transfers, min assist for cognition   Meredith Staggers, MD, [facility]      See Team Conference Notes for weekly updates to the plan of care

## 2015-09-17 NOTE — Progress Notes (Signed)
Speech Language Pathology Daily Session Note  Patient Details  Name: Andrea Keller MRN: [id number] Date of Birth: 04/19/1946  Today's Date: 09/17/2015 SLP Individual Time: 1330-1430 SLP Individual Time Calculation (min): 60 min  Short Term Goals: Week 1: SLP Short Term Goal 1 (Week 1): Pt to demonstrate basic problem solving with min A of question/reasoning cueing. SLP Short Term Goal 2 (Week 1): Pt to complete attention task to locate items in the environment with min A of verbal cueing. SLP Short Term Goal 3 (Week 1): Pt to demonstrate O x 4 at mod I level with use of environmental cues. SLP Short Term Goal 4 (Week 1): Pt to verbalize 2 safety precautions related to new limitations with min verbal cues. SLP Short Term Goal 5 (Week 1): Pt to demonstrate functional reading at the sentence level at mod I level for increased time/self-correction.  Skilled Therapeutic Interventions: Pt recalled therapist from previous session and was able to recall some pertinent details. Pt continues to deny deficits and any potential functional limitations they pose, however pt's daughter and spouse verbalize understanding of pt's limitations. Pt required mod A to complete simple trail making task. Requires min to mod A to navigate environment due to decreased attention to left. Pt able to demonstrate orientation to self, situation, month and place, however she continues to demonstrate temporal confusion.  For example, pt was able to locate her birthday on the calendar with mod A for L attention, but then emphatically insisted that her birthday ([birth year]) was last week. She was able to identify that yesterday was [place] Day, but insisted that today is Saturday. Pt is redirectable, but insistent.    Function:  Eating Eating   Modified Consistency Diet: No Eating Assist Level: Set up assist for   Eating Set Up Assist For: Opening containers       Cognition Comprehension Comprehension assist level:  Understands basic 75 - 89% of the time/ requires cueing 10 - 24% of the time  Expression   Expression assist level: Expresses basic 75 - 89% of the time/requires cueing 10 - 24% of the time. Needs helper to occlude trach/needs to repeat words.  Social Interaction Social Interaction assist level: Interacts appropriately with others with medication or extra time (anti-anxiety, antidepressant).  Problem Solving Problem solving assist level: Solves basic 25 - 49% of the time - needs direction more than half the time to initiate, plan or complete simple activities  Memory Memory assist level: Recognizes or recalls 25 - 49% of the time/requires cueing 50 - 75% of the time    Pain Pain Assessment Pain Assessment: No/denies pain  Therapy/Group: Individual Therapy  Vinetta Bergamo 09/17/2015, 5:42 PM

## 2015-09-17 NOTE — Progress Notes (Signed)
Physical Therapy Session Note  Patient Details  Name: Andrea Keller MRN: [id number] Date of Birth: Jul 29, 1945  Today's Date: 09/17/2015 PT Individual Time: 0800-0915 PT Individual Time Calculation (min): 75 min     Skilled Therapeutic Interventions/Progress Updates:  Patient in bed at the beginning of session ,agrees to therapy intervention, no c/o pain or discomfort. Bed mobility with Supervision to come to sit EOb . Nursing administered medication. Breakfast has been delivered and assisted patient to eat while sitting EOB, cues for using L UE and appropriate use of utensils. Patient very inattentive to L side, with cues able to turn head and has short bouts of attention.  Patient performed teeth brushing standing in front of the sink, perseverates on items and needs frequent redirection. Transfer to bathroom with supervision -also for toileting.  Training in gait with no AD and occasional HHA, due to L side inattention. Patient able to ambulate to gym with decreased velocity and continuity of gait.  NuStep x 10 min with focus on speed over 40 steps per minute to facilitate reciprocal movement and fluidity of motion.  Tin standing activities with horseshoes -hanging on basketball ring with use of l UE only-mod cues for accuracy.  [facility] tapping with color sequencing -min A for balance and cues for color order.  At the end of session patient ambulated back to room with Supervision, left in room with dtr present ,all needs within reach.   Therapy Documentation Precautions:  Precautions Precautions: Fall Restrictions Weight Bearing Restrictions: No   See Function Navigator for Current Functional Status.   Therapy/Group: Individual Therapy  [name] 09/17/2015, 12:37 PM

## 2015-09-17 NOTE — Progress Notes (Addendum)
Subjective/Complaints: Sitting at [facility] working with therapy. Putting on shoes. Denies pain. Slept well.   ROS:  No CP, SOB, N/V/D  Objective: Vital Signs: Blood pressure 128/52, pulse 57, temperature 98.2 F (36.8 C), temperature source Oral, resp. rate 16, height 5\' 4"  (1.626 m), weight 62.333 kg (137 lb 6.7 oz), SpO2 100 %. No results found. Results for orders placed or performed during the hospital encounter of 09/14/15 (from the past 72 hour(s))  Urinalysis, Routine w reflex microscopic (not at [facility])     Status: None   Collection Time: 09/16/15  2:19 PM  Result Value Ref Range   Color, Urine YELLOW YELLOW   APPearance CLEAR CLEAR   Specific Gravity, Urine 1.019 1.005 - 1.030   pH 5.0 5.0 - 8.0   Glucose, UA NEGATIVE NEGATIVE mg/dL   Hgb urine dipstick NEGATIVE NEGATIVE   Bilirubin Urine NEGATIVE NEGATIVE   Ketones, ur NEGATIVE NEGATIVE mg/dL   Protein, ur NEGATIVE NEGATIVE mg/dL   Nitrite NEGATIVE NEGATIVE   Leukocytes, UA NEGATIVE NEGATIVE    Comment: MICROSCOPIC NOT DONE ON URINES WITH NEGATIVE PROTEIN, BLOOD, LEUKOCYTES, NITRITE, OR GLUCOSE <1000 mg/dL.  Urine culture     Status: None   Collection Time: 09/16/15  2:19 PM  Result Value Ref Range   Specimen Description URINE, CLEAN CATCH    Special Requests NONE    Culture NO GROWTH 1 DAY    Report Status 09/17/2015 FINAL       General: No acute distress Mood and affect are appropriate Heart: Regular rate and rhythm no rubs murmurs or extra sounds Lungs: Clear to auscultation, breathing unlabored, no rales or wheezes Abdomen: Positive bowel sounds, soft nontender to palpation, nondistended Extremities: No clubbing, cyanosis, or edema Skin: No evidence of breakdown, no evidence of rash Neurologic: Cranial nerves II through XII intact, motor strength is 5/5 in RIght and 3- Left deltoid, bicep, tricep, grip, 5/5 bilateral hip flexor, knee extensors, ankle dorsiflexor and plantar flexor Sensory exam normal sensation to  light touch and proprioception in bilateral upper and lower extremities Cerebellar exam normal finger to nose to finger as well as heel to shin in bilateral upper and lower extremities Musculoskeletal: Full range of motion in all 4 extremities. No joint swelling   Assessment/Plan: 1. 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 Physiatrist is providing close team supervision and 24 hour management of active medical problems listed below. Physiatrist and rehab team continue to assess barriers to discharge/monitor patient progress toward functional and medical goals. FIM: Function - Bathing Position: Shower Body parts bathed by patient: Right arm, Left arm, Chest, Abdomen, Front perineal area, Left upper leg, Right upper leg Body parts bathed by helper: Buttocks, Right lower leg, Left lower leg Assist Level: Touching or steadying assistance(Pt > 75%) Set up : To obtain items, To adjust water temperature  Function- Upper Body Dressing/Undressing What is the patient wearing?: Bra, Pull over shirt/dress Bra - Perfomed by helper: Thread/unthread right bra strap, Thread/unthread left bra strap Pull over shirt/dress - Perfomed by patient: Thread/unthread left sleeve, Thread/unthread right sleeve Pull over shirt/dress - Perfomed by helper: Thread/unthread left sleeve, Pull shirt over trunk Assist Level: Touching or steadying assistance(Pt > 75%) Function - Lower Body Dressing/Undressing What is the patient wearing?: Pants, Underwear, Socks, Shoes Position: Other (comment) Underwear - Performed by patient: Thread/unthread right underwear leg, Pull underwear up/down Underwear - Performed by helper: Thread/unthread left underwear  leg Pants- Performed by patient: Thread/unthread right pants leg, Pull pants up/down Pants- Performed by helper: Thread/unthread left pants leg Non-skid  slipper socks- Performed by helper: Don/doff right sock, Don/doff left sock Socks - Performed by patient: Don/doff left sock, Don/doff right sock Socks - Performed by helper: Don/doff right sock Shoes - Performed by patient: Don/doff right shoe, Don/doff left shoe Shoes - Performed by helper: Don/doff right shoe, Don/doff left shoe Assist for footwear: Supervision/touching assist Assist for lower body dressing: Touching or steadying assistance (Pt > 75%)  Function - Toileting Toileting steps completed by patient: Adjust clothing prior to toileting, Performs perineal hygiene, Adjust clothing after toileting Toileting Assistive Devices: Grab bar or rail Assist level: Supervision or verbal cues  Function Midwife transfer assistive device: Grab bar Assist level to toilet: Supervision or verbal cues Assist level from toilet: Supervision or verbal cues  Function - Chair/bed transfer Chair/bed transfer method: Ambulatory Chair/bed transfer assist level: Supervision or verbal cues Chair/bed transfer assistive device: Armrests  Function - Locomotion: Wheelchair Will patient use wheelchair at discharge?: No Function - Locomotion: Ambulation Assistive device: No device Max distance: 300 Assist level: Supervision or verbal cues Assist level: Supervision or verbal cues Assist level: Supervision or verbal cues Assist level: Supervision or verbal cues Assist level: Touching or steadying assistance (Pt > 75%)  Function - Comprehension Comprehension: Auditory Comprehension assist level: Understands basic 90% of the time/cues < 10% of the time  Function - Expression Expression: Verbal Expression assist level: Expresses basic needs/ideas: With extra time/assistive device  Function - Social Interaction Social Interaction assist level: Interacts appropriately with others - No medications needed.  Function - Problem Solving Problem solving assist level: Solves basic 25 - 49%  of the time - needs direction more than half the time to initiate, plan or complete simple activities  Function - Memory Memory assist level: Recognizes or recalls 50 - 74% of the time/requires cueing 25 - 49% of the time Patient normally able to recall (first 3 days only): Current season, That he or she is in a hospital  Medical Problem List and Plan: 1.  Persistent frontal headaches left neglect and field cut generalized weakness with altered mental status secondary to traumatic ICH- Cont rehab 2.  DVT Prophylaxis/Anticoagulation: SCDs. Monitor for any signs of DVT 3. Pain Management: Percocet as needed 4. 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 Follow-up outpatient interventional radiology Dr. [name] 5. Neuropsych: This patient is not capable of making decisions on her own behalf. 6. Skin/Wound Care: Routine skin checks 7. Fluids/Electrolytes/Nutrition: encourage po.  follow-up chemistries 8. History of goblet cell carcinoid status post hemicolectomy. [facility] post d/c.Continent and regular with bowels 9. Hyperlipidemia. Crestor 10. HTN: monitor and provide prns in accordance with increased physical exertion and pain 11. ABLA: Monitor and transfuse if necessary 12. Thrombocytopenia: Cont to follow platelets 13.  Sundowning per daughter, did not occur at home, likely ICH related, no sig agitation noted expect improvement  -improvement last night  -ua,cx neg  LOS (Days) 3 A FACE TO FACE EVALUATION WAS PERFORMED  Belisa Eichholz T 09/17/2015, 12:41 PM

## 2015-09-18 DIAGNOSIS — I611 Nontraumatic intracerebral hemorrhage in hemisphere, cortical: Secondary | ICD-10-CM

## 2015-09-18 NOTE — Progress Notes (Signed)
Subjective/Complaints: Eating breakfast. Has good appetite. Last night appears to have gone well.    ROS:  No CP, SOB, N/V/D  Objective: Vital Signs: Blood pressure 141/64, pulse 59, temperature 98.7 F (37.1 C), temperature source Oral, resp. rate 18, height 5\' 4"  (1.626 m), weight 62.333 kg (137 lb 6.7 oz), SpO2 100 %. No results found. Results for orders placed or performed during the hospital encounter of 09/14/15 (from the past 72 hour(s))  Urinalysis, Routine w reflex microscopic (not at [facility])     Status: None   Collection Time: 09/16/15  2:19 PM  Result Value Ref Range   Color, Urine YELLOW YELLOW   APPearance CLEAR CLEAR   Specific Gravity, Urine 1.019 1.005 - 1.030   pH 5.0 5.0 - 8.0   Glucose, UA NEGATIVE NEGATIVE mg/dL   Hgb urine dipstick NEGATIVE NEGATIVE   Bilirubin Urine NEGATIVE NEGATIVE   Ketones, ur NEGATIVE NEGATIVE mg/dL   Protein, ur NEGATIVE NEGATIVE mg/dL   Nitrite NEGATIVE NEGATIVE   Leukocytes, UA NEGATIVE NEGATIVE    Comment: MICROSCOPIC NOT DONE ON URINES WITH NEGATIVE PROTEIN, BLOOD, LEUKOCYTES, NITRITE, OR GLUCOSE <1000 mg/dL.  Urine culture     Status: None   Collection Time: 09/16/15  2:19 PM  Result Value Ref Range   Specimen Description URINE, CLEAN CATCH    Special Requests NONE    Culture NO GROWTH 1 DAY    Report Status 09/17/2015 FINAL       General: No acute distress Mood and affect are appropriate Heart: Regular rate and rhythm no rubs murmurs or extra sounds Lungs: Clear to auscultation, breathing unlabored, no rales or wheezes Abdomen: Positive bowel sounds, soft nontender to palpation, nondistended Extremities: No clubbing, cyanosis, or edema Skin: No evidence of breakdown, no evidence of rash Neurologic: Cranial nerves II through XII intact, motor strength is 5/5 in RIght and 3- Left deltoid, bicep, tricep, grip, 5/5 bilateral hip flexor, knee extensors, ankle dorsiflexor and plantar flexor Sensory exam normal sensation to light  touch and proprioception in bilateral upper and lower extremities Cerebellar exam normal finger to nose to finger as well as heel to shin in bilateral upper and lower extremities Musculoskeletal: Full range of motion in all 4 extremities. No joint swelling   Assessment/Plan: 1. 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 Physiatrist is providing close team supervision and 24 hour management of active medical problems listed below. Physiatrist and rehab team continue to assess barriers to discharge/monitor patient progress toward functional and medical goals. FIM: Function - Bathing Position: Shower Body parts bathed by patient: Right arm, Left arm, Chest, Abdomen, Front perineal area, Left upper leg, Right upper leg Body parts bathed by helper: Buttocks, Right lower leg, Left lower leg Assist Level: Touching or steadying assistance(Pt > 75%) Set up : To obtain items, To adjust water temperature  Function- Upper Body Dressing/Undressing What is the patient wearing?: Bra, Pull over shirt/dress Bra - Perfomed by helper: Thread/unthread right bra strap, Thread/unthread left bra strap Pull over shirt/dress - Perfomed by patient: Thread/unthread left sleeve, Thread/unthread right sleeve Pull over shirt/dress - Perfomed by helper: Thread/unthread left sleeve, Pull shirt over trunk Assist Level: Touching or steadying assistance(Pt > 75%) Function - Lower Body Dressing/Undressing What is the patient wearing?: Pants, Underwear, Socks, Shoes Position: Other (comment) Underwear - Performed by patient: Thread/unthread right underwear leg, Pull underwear up/down Underwear - Performed by helper: Thread/unthread left underwear  leg Pants- Performed by patient: Thread/unthread right pants leg, Pull pants up/down Pants- Performed by helper: Thread/unthread left pants leg Non-skid slipper  socks- Performed by helper: Don/doff right sock, Don/doff left sock Socks - Performed by patient: Don/doff left sock, Don/doff right sock Socks - Performed by helper: Don/doff right sock Shoes - Performed by patient: Don/doff right shoe, Don/doff left shoe Shoes - Performed by helper: Don/doff right shoe, Don/doff left shoe Assist for footwear: Supervision/touching assist Assist for lower body dressing: Touching or steadying assistance (Pt > 75%)  Function - Toileting Toileting steps completed by patient: Adjust clothing prior to toileting, Performs perineal hygiene, Adjust clothing after toileting Toileting Assistive Devices: Grab bar or rail Assist level: Supervision or verbal cues  Function Midwife transfer assistive device: Grab bar Assist level to toilet: Supervision or verbal cues Assist level from toilet: Supervision or verbal cues  Function - Chair/bed transfer Chair/bed transfer method: Ambulatory Chair/bed transfer assist level: Supervision or verbal cues Chair/bed transfer assistive device: Armrests  Function - Locomotion: Wheelchair Will patient use wheelchair at discharge?: No Function - Locomotion: Ambulation Assistive device: No device Max distance: 300 Assist level: Supervision or verbal cues Assist level: Supervision or verbal cues Assist level: Supervision or verbal cues Assist level: Supervision or verbal cues Assist level: Touching or steadying assistance (Pt > 75%)  Function - Comprehension Comprehension: Auditory Comprehension assist level: Understands basic 90% of the time/cues < 10% of the time  Function - Expression Expression: Verbal Expression assist level: Expresses basic 90% of the time/requires cueing < 10% of the time.  Function - Social Interaction Social Interaction assist level: Interacts appropriately 90% of the time - Needs monitoring or encouragement for participation or interaction.  Function - Problem  Solving Problem solving assist level: Solves basic 90% of the time/requires cueing < 10% of the time  Function - Memory Memory assist level: Recognizes or recalls 90% of the time/requires cueing < 10% of the time Patient normally able to recall (first 3 days only): Current season  Medical Problem List and Plan: 1.  Persistent frontal headaches left neglect and field cut generalized weakness with altered mental status secondary to traumatic ICH- Cont rehab 2.  DVT Prophylaxis/Anticoagulation: SCDs. Monitor for any signs of DVT 3. Pain Management: Percocet as needed 4. 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 Follow-up outpatient interventional radiology Dr. [name] 5. Neuropsych: This patient is not capable of making decisions on her own behalf. 6. Skin/Wound Care: Routine skin checks 7. Fluids/Electrolytes/Nutrition: encourage po.  8. History of goblet cell carcinoid status post hemicolectomy. [facility] post d/c.Continent and regular with bowels 9. Hyperlipidemia. Crestor 10. HTN: monitor and provide prns in accordance with increased physical exertion and pain 11. ABLA: Monitor and transfuse if necessary 12. Thrombocytopenia: Cont to follow platelets 13.  Sundowning per daughter, did not occur at home, likely ICH related, no current agitation noted expect ongoing improvement  -had another good night  -ua,cx neg  LOS (Days) 4 A FACE TO FACE EVALUATION WAS PERFORMED  SWARTZ,ZACHARY T 09/18/2015, 11:24 AM

## 2015-09-19 ENCOUNTER — Inpatient Hospital Stay (HOSPITAL_COMMUNITY): Payer: Medicare Other | Admitting: Physical Therapy

## 2015-09-19 ENCOUNTER — Inpatient Hospital Stay (HOSPITAL_COMMUNITY): Payer: Medicare Other | Admitting: Occupational Therapy

## 2015-09-19 ENCOUNTER — Inpatient Hospital Stay (HOSPITAL_COMMUNITY): Payer: Medicare Other | Admitting: Speech Pathology

## 2015-09-19 NOTE — Progress Notes (Signed)
Speech Language Pathology Daily Session Note  Patient Details  Name: Andrea Keller MRN: [id number] Date of Birth: 30-Apr-1946  Today's Date: 09/19/2015 SLP Individual Time: 1101-1201 SLP Individual Time Calculation (min): 60 min  Short Term Goals: Week 1: SLP Short Term Goal 1 (Week 1): Pt to demonstrate basic problem solving with min A of question/reasoning cueing. SLP Short Term Goal 2 (Week 1): Pt to complete attention task to locate items in the environment with min A of verbal cueing. SLP Short Term Goal 3 (Week 1): Pt to demonstrate O x 4 at mod I level with use of environmental cues. SLP Short Term Goal 4 (Week 1): Pt to verbalize 2 safety precautions related to new limitations with min verbal cues. SLP Short Term Goal 5 (Week 1): Pt to demonstrate functional reading at the sentence level at mod I level for increased time/self-correction.  Skilled Therapeutic Interventions:  Pt was seen for skilled ST targeting cognitive goals.  Pt required mod assist verbal cues for route finding and identifying places on the left when ambulating to ST treatment room.  Pt required max to total assist for organization, working memory, and sustained attention to task during a basic money management task.  Pt with improving awareness into deficits and was able to identify that her vision is impaired after falling at home which could impact her safety during driving and househould tasks.  SLP reiterated that pt would need 24/7 supervision at home in addition to assistance for medication and financial management.  Pt able to recall route from ST treatment room to her room with supervision at the end of today's session.  Pt left in recliner with quick release belt donned, call bell left within reach.  Continue per current plan of care.    Function:  Eating Eating                 Cognition Comprehension Comprehension assist level: Understands basic 75 - 89% of the time/ requires cueing 10 - 24% of the  time  Expression   Expression assist level: Expresses basic 90% of the time/requires cueing < 10% of the time.  Social Interaction Social Interaction assist level: Interacts appropriately 90% of the time - Needs monitoring or encouragement for participation or interaction.  Problem Solving Problem solving assist level: Solves basic 50 - 74% of the time/requires cueing 25 - 49% of the time  Memory Memory assist level: Recognizes or recalls 50 - 74% of the time/requires cueing 25 - 49% of the time    Pain Pain Assessment Pain Assessment: No/denies pain  Therapy/Group: Individual Therapy  Myeshia Fojtik, Selinda Orion 09/19/2015, 12:54 PM

## 2015-09-19 NOTE — Progress Notes (Signed)
Subjective/Complaints: Ate 100% breakfast, slept well  ROS:  No CP, SOB, N/V/D  Objective: Vital Signs: Blood pressure 130/52, pulse 43, temperature 97.9 F (36.6 C), temperature source Oral, resp. rate 18, height 5\' 4"  (1.626 m), weight 62.333 kg (137 lb 6.7 oz), SpO2 99 %. No results found. Results for orders placed or performed during the hospital encounter of 09/14/15 (from the past 72 hour(s))  Urinalysis, Routine w reflex microscopic (not at [facility])     Status: None   Collection Time: 09/16/15  2:19 PM  Result Value Ref Range   Color, Urine YELLOW YELLOW   APPearance CLEAR CLEAR   Specific Gravity, Urine 1.019 1.005 - 1.030   pH 5.0 5.0 - 8.0   Glucose, UA NEGATIVE NEGATIVE mg/dL   Hgb urine dipstick NEGATIVE NEGATIVE   Bilirubin Urine NEGATIVE NEGATIVE   Ketones, ur NEGATIVE NEGATIVE mg/dL   Protein, ur NEGATIVE NEGATIVE mg/dL   Nitrite NEGATIVE NEGATIVE   Leukocytes, UA NEGATIVE NEGATIVE    Comment: MICROSCOPIC NOT DONE ON URINES WITH NEGATIVE PROTEIN, BLOOD, LEUKOCYTES, NITRITE, OR GLUCOSE <1000 mg/dL.  Urine culture     Status: None   Collection Time: 09/16/15  2:19 PM  Result Value Ref Range   Specimen Description URINE, CLEAN CATCH    Special Requests NONE    Culture NO GROWTH 1 DAY    Report Status 09/17/2015 FINAL       General: No acute distress Mood and affect are appropriate Heart: Regular rate and rhythm no rubs murmurs or extra sounds Lungs: Clear to auscultation, breathing unlabored, no rales or wheezes Abdomen: Positive bowel sounds, soft nontender to palpation, nondistended Extremities: No clubbing, cyanosis, or edema Skin: No evidence of breakdown, no evidence of rash Neurologic: Cranial nerves II through XII intact, motor strength is 5/5 in RIght and 3- Left deltoid, bicep, tricep, grip, 5/5 bilateral hip flexor, knee extensors, ankle dorsiflexor and plantar flexor Sensory exam normal sensation to light touch and proprioception in bilateral upper  and lower extremities Cerebellar exam normal finger to nose to finger as well as heel to shin in bilateral upper and lower extremities Musculoskeletal: Full range of motion in all 4 extremities. No joint swelling   Assessment/Plan: 1. 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 Physiatrist is providing close team supervision and 24 hour management of active medical problems listed below. Physiatrist and rehab team continue to assess barriers to discharge/monitor patient progress toward functional and medical goals. FIM: Function - Bathing Position: Shower Body parts bathed by patient: Right arm, Left arm, Chest, Abdomen, Front perineal area, Left upper leg, Right upper leg Body parts bathed by helper: Buttocks, Right lower leg, Left lower leg Assist Level: Touching or steadying assistance(Pt > 75%) Set up : To obtain items, To adjust water temperature  Function- Upper Body Dressing/Undressing What is the patient wearing?: Bra, Pull over shirt/dress Bra - Perfomed by helper: Thread/unthread right bra strap, Thread/unthread left bra strap Pull over shirt/dress - Perfomed by patient: Thread/unthread left sleeve, Thread/unthread right sleeve Pull over shirt/dress - Perfomed by helper: Thread/unthread left sleeve, Pull shirt over trunk Assist Level: Touching or steadying assistance(Pt > 75%) Function - Lower Body Dressing/Undressing What is the patient wearing?: Pants, Underwear, Socks, Shoes Position: Other (comment) Underwear - Performed by patient: Thread/unthread right underwear leg, Pull underwear up/down Underwear - Performed by helper: Thread/unthread left underwear leg Pants- Performed by patient: Thread/unthread right pants leg,  Pull pants up/down Pants- Performed by helper: Thread/unthread left pants leg Non-skid slipper socks- Performed by helper: Don/doff right  sock, Don/doff left sock Socks - Performed by patient: Don/doff left sock, Don/doff right sock Socks - Performed by helper: Don/doff right sock Shoes - Performed by patient: Don/doff right shoe, Don/doff left shoe Shoes - Performed by helper: Don/doff right shoe, Don/doff left shoe Assist for footwear: Supervision/touching assist Assist for lower body dressing: Touching or steadying assistance (Pt > 75%)  Function - Toileting Toileting steps completed by patient: Adjust clothing prior to toileting, Performs perineal hygiene, Adjust clothing after toileting Toileting Assistive Devices: Grab bar or rail Assist level: Supervision or verbal cues  Function Midwife transfer assistive device: Grab bar Assist level to toilet: Supervision or verbal cues Assist level from toilet: Supervision or verbal cues  Function - Chair/bed transfer Chair/bed transfer method: Ambulatory Chair/bed transfer assist level: Supervision or verbal cues Chair/bed transfer assistive device: Armrests  Function - Locomotion: Wheelchair Will patient use wheelchair at discharge?: No Function - Locomotion: Ambulation Assistive device: No device Max distance: 300 Assist level: Supervision or verbal cues Assist level: Supervision or verbal cues Assist level: Supervision or verbal cues Assist level: Supervision or verbal cues Assist level: Touching or steadying assistance (Pt > 75%)  Function - Comprehension Comprehension: Auditory Comprehension assist level: Understands basic 75 - 89% of the time/ requires cueing 10 - 24% of the time  Function - Expression Expression: Verbal Expression assist level: Expresses basic 90% of the time/requires cueing < 10% of the time.  Function - Social Interaction Social Interaction assist level: Interacts appropriately 90% of the time - Needs monitoring or encouragement for participation or interaction.  Function - Problem Solving Problem solving assist  level: Solves basic 90% of the time/requires cueing < 10% of the time  Function - Memory Memory assist level: Recognizes or recalls 90% of the time/requires cueing < 10% of the time Patient normally able to recall (first 3 days only): Current season  Medical Problem List and Plan: 1.  Persistent frontal headaches left neglect and field cut generalized weakness with altered mental status secondary to traumatic ICH- Pt is oriented to person and place and time 2.  DVT Prophylaxis/Anticoagulation: SCDs. Monitor for any signs of DVT 3. Pain Management: Percocet as needed 4. 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 Follow-up outpatient interventional radiology Dr. [name] 5. Neuropsych: This patient is not capable of making decisions on her own behalf. 6. Skin/Wound Care: Routine skin checks 7. Fluids/Electrolytes/Nutrition: encourage po.  8. History of goblet cell carcinoid status post hemicolectomy. [facility] post d/c.appetite is good 9. Hyperlipidemia. Crestor 10. HTN: monitor and provide prns in accordance with increased physical exertion and pain 11. ABLA: Monitor and transfuse if necessary 12. Thrombocytopenia: Cont to follow platelets 13.  Sundowning resolved LOS (Days) 5 A FACE TO FACE EVALUATION WAS PERFORMED  Hira Trent E 09/19/2015, 8:22 AM

## 2015-09-19 NOTE — Progress Notes (Signed)
Occupational Therapy Session Note  Patient Details  Name: Andrea Keller MRN: [id number] Date of Birth: Sep 29, 1945  Today's Date: 09/19/2015 OT Individual Time: [id number] OT Individual Time Calculation (min): 45 min    Short Term Goals: Week 1:  OT Short Term Goal 1 (Week 1): STGs = LTGs  Skilled Therapeutic Interventions/Progress Updates:    ADL retraining with focus on functional mobility, attention to Lt visual field, motor planning and problem solving.  Pt ambulated to sink to wash face prior to bathing at shower level.  Required increased time and min cues to locate items in Lt visual field. Ambulated to shower and completed bathing with min cues due to perseveration vs thoroughness as pt would wash perineal area multiple times.  Pt attempted to wash body with top of soap container, requiring cues to identify that was the lid and not the bar of soap.  Mod verbal and tactile cues for LB dressing due to decreased processing, problem solving, and apraxia with pt requiring cues for orientation and sequence of clothing.  Despite cues to don pants prior to socks (due to hospital non slip socks) pt still donned socks first and then demonstrated mild increase in difficulty with pants.   Therapy Documentation Precautions:  Precautions Precautions: Fall Restrictions Weight Bearing Restrictions: No Pain:  Pt with no c/o pain  See Function Navigator for Current Functional Status.   Therapy/Group: Individual Therapy  Simonne Come 09/19/2015, 12:20 PM

## 2015-09-19 NOTE — Progress Notes (Signed)
Physical Therapy Session Note  Patient Details  Name: Andrea Keller MRN: [id number] Date of Birth: 01-24-1946  Today's Date: 09/19/2015 PT Individual Time: [id number] PT Individual Time Calculation (min): 77 min   Short Term Goals: Week 1:  PT Short Term Goal 1 (Week 1): = LTGs due to anticipated LOS  Skilled Therapeutic Interventions/Progress Updates:    Pt received in recliner & agreeable to PT. Pt ambulated to gym without an AD, supervision, and significantly decreased gait speed. Attempted to use dynavision to address L inattention when pt reported need to use bathroom; pt able to perform toilet transfer & hygiene tasks with supervision. Pt very upset over soiling her brief before getting to bathroom & perseverated on this for half of treatment session even with cues to redirect. Pt ambulated back to room and washed up while standing in bathroom with supervision. Pt ambulated to PT gym and assembled pipe tree shape; pt required frequent cuing & assistance with problem solving. Pt with difficulty selecting correct size objects and demonstrated poor spatial awareness. Pt negotiated 4 steps with bilateral railings, refusing to attempt stair negotiation without any railings 2/2 fear of falling. Pt reports her husband is planning to install rails at home. Pt ambulated room>gym without AD & with supervision; PT educated pt on need to increase gait speed & pt able to demonstrate this but only for brief periods of time, pt also required cuing to avoid objects on L. At end of session pt left in recliner with QRB in place & all needs within reach.  Therapy Documentation Precautions:  Precautions Precautions: Fall Restrictions Weight Bearing Restrictions: No   Pain: Pain Assessment Pain Assessment: No/denies pain   See Function Navigator for Current Functional Status.   Therapy/Group: Individual Therapy  Waunita Schooner 09/19/2015, 4:29 PM

## 2015-09-20 ENCOUNTER — Inpatient Hospital Stay (HOSPITAL_COMMUNITY): Payer: Medicare Other | Admitting: Physical Therapy

## 2015-09-20 ENCOUNTER — Inpatient Hospital Stay (HOSPITAL_COMMUNITY): Payer: Medicare Other | Admitting: Occupational Therapy

## 2015-09-20 ENCOUNTER — Inpatient Hospital Stay (HOSPITAL_COMMUNITY): Payer: Medicare Other | Admitting: Speech Pathology

## 2015-09-20 NOTE — Progress Notes (Signed)
Occupational Therapy Session Note  Patient Details  Name: Andrea Keller MRN: [id number] Date of Birth: January 07, 1946  Today's Date: 09/20/2015 OT Individual Time: 0830-0930 OT Individual Time Calculation (min): 60 min    Short Term Goals: Week 1:  OT Short Term Goal 1 (Week 1): STGs = LTGs  Skilled Therapeutic Interventions/Progress Updates:    ADL retraining with focus on Lt attention, functional mobility, motor planning and problem solving.  Pt ambulated to sink to wash face and complete oral care.  Pt applying toothpaste straight to mouth and when questioned about use of toothbrush pt reporting she was using it, despite toothbrush still being in pt bag. Pt ambulating around room without assist, to obtain clothing in preparation for shower.  Completed bathing at distant supervision level with cues to terminate task as pt would perseverate on washing perineal area and demonstrated difficulty releasing wash cloth.  Required cues to attend to Lt side to wash and scan to Lt to obtain items.  Donned bra with assist to fasten and demonstrated difficulty with orientation of shirt, getting arms stuck in head hole.  Pt would ambulate around room mid task and when asked where she was going or what she was looking for, she would state "I don't know".Pt continued to report that "I would do better in my own shower, my own environment", when questioned about challenges.  Therapy Documentation Precautions:  Precautions Precautions: Fall Restrictions Weight Bearing Restrictions: No Pain: Pain Assessment Pain Assessment: No/denies pain  See Function Navigator for Current Functional Status.   Therapy/Group: Individual Therapy  Simonne Come 09/20/2015, 11:05 AM

## 2015-09-20 NOTE — Progress Notes (Signed)
Physical Therapy Note  Patient Details  Name: Andrea Keller MRN: [id number] Date of Birth: 1946/02/23 Today's Date: 09/20/2015    Time: 1015-1055 40 minutes  1:1 No c/o pain.  Pt agreeable to treatment.  Session focused on functional task in kitchen of cooking pancakes.  Pt able to gait >400' with supervision with decreased cadence, no LOB. Pt able to perform household gait around kitchen at supervision level. Pt max cuing for following instructions for cooking.  Max cuing for safety as pt left burner on too high and burned butter.  Pt with difficulty sequencing and problem solving. Pt attempted to spoon batter over already cooked pancakes, then placed cooked pancakes back in the pan on top of uncooked pancakes.  Pt required cues to sequence washing and drying dishes. Pt requires max cuing for Lt attention throughout task. Pt with decreased awareness of deficits and states she had trouble with the cooking task because "there were no eggs in these pancakes, that's why I had trouble".  PT attempted to make pt aware of deficits but pt states "once I get home  I will be able to cook just fine".  During session pt also would walk away from task and when asked where she was going pt would state "I don't know" and then require cuing to return to task.   Forever Arechiga 09/20/2015, 10:56 AM

## 2015-09-20 NOTE — Progress Notes (Signed)
Speech Language Pathology Daily Session Note  Patient Details  Name: Andrea Keller MRN: [id number] Date of Birth: 01-07-1946  Today's Date: 09/20/2015 SLP Individual Time: 1300-1400 SLP Individual Time Calculation (min): 60 min  Short Term Goals: Week 1: SLP Short Term Goal 1 (Week 1): Pt to demonstrate basic problem solving with min A of question/reasoning cueing. SLP Short Term Goal 2 (Week 1): Pt to complete attention task to locate items in the environment with min A of verbal cueing. SLP Short Term Goal 3 (Week 1): Pt to demonstrate O x 4 at mod I level with use of environmental cues. SLP Short Term Goal 4 (Week 1): Pt to verbalize 2 safety precautions related to new limitations with min verbal cues. SLP Short Term Goal 5 (Week 1): Pt to demonstrate functional reading at the sentence level at mod I level for increased time/self-correction.  Skilled Therapeutic Interventions:  Pt was seen for skilled ST targeting cognitive goals.  Pt was seated in recliner upon arrival requesting to put away her dirty clothes.  Max assist verbal and visual cues were needed for route finding to and from nursing station to obtain a pt belongings bag.  Once returned to room, pt required max assist verbal and tactile cues for initiation, sequencing and task organization to place dirty clothes into bag due to impulsivity and poor awareness of deficits.  Pt was able to sustain her attention to task for ~2-3 minute intervals with min-mod assist verbal cues for redirection.  Pt also required mod assist verbal cues and extra time to attend to items on her left in the environment when completing a familiar plant watering task.  SLP facilitated the session with a basic sorting task targeting sustained attention and visual scanning to the left of midline.  Pt was able to sustain her attention to task for ~5 minutes with min verbal cues for redirection.  Mod assist verbal cues needed to sort items on the left.   Poor  intellectual awareness of deficits persist despite multiple planned opportunities for failure and education, specifically regarding left inattention.  Pt was returned to room and left in bed with call bell within reach and bed alarm set.  Continue per current plan of care.     Function:  Eating Eating                 Cognition Comprehension Comprehension assist level: Understands basic 50 - 74% of the time/ requires cueing 25 - 49% of the time  Expression   Expression assist level: Expresses basic 90% of the time/requires cueing < 10% of the time.  Social Interaction Social Interaction assist level: Interacts appropriately 75 - 89% of the time - Needs redirection for appropriate language or to initiate interaction.  Problem Solving Problem solving assist level: Solves basic 50 - 74% of the time/requires cueing 25 - 49% of the time  Memory Memory assist level: Recognizes or recalls 50 - 74% of the time/requires cueing 25 - 49% of the time    Pain Pain Assessment Pain Assessment: No/denies pain  Therapy/Group: Individual Therapy  Charlot Gouin, Selinda Orion 09/20/2015, 3:37 PM

## 2015-09-20 NOTE — Progress Notes (Signed)
Subjective/Complaints: Eating breakfast  Oriented to x 3 , remembers my name Discussed with RN still getting up at noc without supervision  ROS:  No CP, SOB, N/V/D  Objective: Vital Signs: Blood pressure 158/59, pulse 55, temperature 98.3 F (36.8 C), temperature source Oral, resp. rate 16, height 5\' 4"  (1.626 m), weight 62.333 kg (137 lb 6.7 oz), SpO2 100 %. No results found. No results found for this or any previous visit (from the past 72 hour(s)).    General: No acute distress Mood and affect are appropriate Heart: Regular rate and rhythm no rubs murmurs or extra sounds Lungs: Clear to auscultation, breathing unlabored, no rales or wheezes Abdomen: Positive bowel sounds, soft nontender to palpation, nondistended Extremities: No clubbing, cyanosis, or edema Skin: No evidence of breakdown, no evidence of rash Neurologic: Cranial nerves II through XII intact, motor strength is 5/5 in RIght and 3- Left deltoid, bicep, tricep, grip, 5/5 bilateral hip flexor, knee extensors, ankle dorsiflexor and plantar flexor Sensory exam normal sensation to light touch and proprioception in bilateral upper and lower extremities Cerebellar exam normal finger to nose to finger as well as heel to shin in bilateral upper and lower extremities Musculoskeletal: Full range of motion in all 4 extremities. No joint swelling   Assessment/Plan: 1. 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 Physiatrist is providing close team supervision and 24 hour management of active medical problems listed below. Physiatrist and rehab team continue to assess barriers to discharge/monitor patient progress toward functional and medical goals. FIM: Function - Bathing Position: Shower Body parts bathed by patient: Right arm, Left arm, Chest, Abdomen, Front perineal area, Left upper leg, Right upper leg,  Right lower leg, Left lower leg, Buttocks Body parts bathed by helper: Back Assist Level: Set up, Supervision or verbal cues Set up : To obtain items, To adjust water temperature  Function- Upper Body Dressing/Undressing What is the patient wearing?: Bra, Pull over shirt/dress Bra - Perfomed by patient: Thread/unthread right bra strap, Thread/unthread left bra strap Bra - Perfomed by helper: Hook/unhook bra (pull down sports bra) Pull over shirt/dress - Perfomed by patient: Thread/unthread left sleeve, Thread/unthread right sleeve, Put head through opening, Pull shirt over trunk Pull over shirt/dress - Perfomed by helper: Thread/unthread left sleeve, Pull shirt over trunk Assist Level: Touching or steadying assistance(Pt > 75%) Function - Lower Body Dressing/Undressing What is the patient wearing?: Pants, Underwear, Non-skid slipper socks Position:  (recliner) Underwear - Performed by patient: Thread/unthread right underwear leg, Pull underwear up/down, Thread/unthread left underwear leg Underwear - Performed by helper: Thread/unthread left underwear leg Pants- Performed by patient: Thread/unthread right pants leg, Pull pants up/down, Thread/unthread left pants leg Pants- Performed by helper: Thread/unthread left pants leg Non-skid slipper socks- Performed by patient: Don/doff right sock, Don/doff left sock Non-skid slipper socks- Performed by helper: Don/doff right sock, Don/doff left sock Socks - Performed by patient: Don/doff left sock, Don/doff right sock Socks - Performed by helper: Don/doff right sock Shoes - Performed by patient: Don/doff right shoe, Don/doff left shoe Shoes - Performed by helper: Don/doff right shoe, Don/doff left shoe Assist for footwear: Supervision/touching assist Assist for lower body dressing: Set up, Supervision or verbal cues, Touching or steadying assistance (Pt > 75%) Set up : To obtain clothing/put away  Function - Toileting Toileting steps completed  by patient: Adjust clothing prior to toileting, Performs perineal hygiene, Adjust clothing after toileting Toileting Assistive  Devices: Grab bar or rail Assist level: Supervision or verbal cues  Function - Air cabin crew transfer assistive device: Grab bar Assist level to toilet: Supervision or verbal cues Assist level from toilet: Supervision or verbal cues  Function - Chair/bed transfer Chair/bed transfer method: Ambulatory Chair/bed transfer assist level: Supervision or verbal cues Chair/bed transfer assistive device: Armrests  Function - Locomotion: Wheelchair Will patient use wheelchair at discharge?: No Function - Locomotion: Ambulation Assistive device: No device Max distance: 250 Assist level: Supervision or verbal cues Assist level: Supervision or verbal cues Assist level: Supervision or verbal cues Assist level: Supervision or verbal cues Assist level: Touching or steadying assistance (Pt > 75%)  Function - Comprehension Comprehension: Auditory Comprehension assist level: Understands basic 50 - 74% of the time/ requires cueing 25 - 49% of the time  Function - Expression Expression: Verbal Expression assist level: Expresses basic 90% of the time/requires cueing < 10% of the time.  Function - Social Interaction Social Interaction assist level: Interacts appropriately 75 - 89% of the time - Needs redirection for appropriate language or to initiate interaction.  Function - Problem Solving Problem solving assist level: Solves basic 75 - 89% of the time/requires cueing 10 - 24% of the time  Function - Memory Memory assist level: Recognizes or recalls 50 - 74% of the time/requires cueing 25 - 49% of the time Patient normally able to recall (first 3 days only): Location of own room  Medical Problem List and Plan: 1.  Persistent frontal headaches left neglect and field cut generalized weakness with altered mental status secondary to traumatic ICH- Oriented during  the day, tolerating PT, team conf in am 2.  DVT Prophylaxis/Anticoagulation: SCDs. Monitor for any signs of DVT 3. Pain Management: Percocet as needed 4. 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 Follow-up outpatient interventional radiology Dr. [name] 5. Neuropsych: This patient is not capable of making decisions on her own behalf. 6. Skin/Wound Care: Routine skin checks 7. Fluids/Electrolytes/Nutrition: encourage po.  8. History of goblet cell carcinoid status post hemicolectomy. [facility] post d/c.appetite is good 9. Hyperlipidemia. Crestor 10. HTN: monitor and provide prns in accordance with increased physical exertion and pain 11. ABLA: Monitor and transfuse if necessary 12. Thrombocytopenia: Cont to follow platelets 13.  Sundowning still unsafe at noc per RN LOS (Days) [place] E 09/20/2015, 7:49 AM

## 2015-09-20 NOTE — Progress Notes (Signed)
Occupational Therapy Session Note  Patient Details  Name: Andrea Keller MRN: [id number] Date of Birth: June 28, 1946  Today's Date: 09/20/2015 OT Individual Time: [id number] OT Individual Time Calculation (min): 36 min    Short Term Goals: Week 1:  OT Short Term Goal 1 (Week 1): STGs = LTGs  Skilled Therapeutic Interventions/Progress Updates:    Pt ambulated to the ADL apartment to work on simple home management task of making the bed.  She demonstrates slow walking speed but did not need more than supervision to complete mobility without assistive device.  She was able to make up the bed but perseverated on getting wrinkles out of the plastic mattress cover.  Mod instructional cueing to figure out which sheet was the fitted one and then attempt putting it on.  She was able to apply one corner and then became distracted by the plastic mattress cover again and started working on it.  She did not go back to finishing the fitted sheet until therapist had to cue her as she was attempting to put the comforter on before completing it.  Mod assist needed to finish putting on fitted sheet and progress to placing comforter and pillows.  After placing pillow cases on two pillows pt stacked them and the sham pillows in the middle of the bed instead of 2 on one side and then 2 on the other.  Cueing needed to reposition them.  Mod assist also needed to place comforter correctly secondary to time.  Pt ambulated back to her room where she was able to locate it on the left side of the hall with only one min instructional cue.  Pt left in bed with family present and bed alarm in place.   Therapy Documentation Precautions:  Precautions Precautions: Fall Restrictions Weight Bearing Restrictions: No  Vital Signs: Therapy Vitals Temp: 98.5 F (36.9 C) Temp Source: Oral Pulse Rate: (!) 55 Resp: 16 BP: (!) 146/69 mmHg Patient Position (if appropriate): Lying Oxygen Therapy SpO2: 99 % O2 Device: Not  Delivered Pain: Pain Assessment Pain Assessment: No/denies pain ADL: See Function Navigator for Current Functional Status.   Therapy/Group: Individual Therapy  Szymon Foiles OTR/L 09/20/2015, 4:36 PM

## 2015-09-20 NOTE — Progress Notes (Signed)
Social Work Patient ID: Andrea Keller, female   DOB: Oct 02, 1945, 70 y.o.   MRN: [id number] Spoke with karen-PT who reports need husband in for family education due to pt will not be here long. Contacted husband and have scheduled him for tomorrow at 9:00 he transport their son back and forth to HD On T,TH & Sat. Will plan pt's schedule for therapies tomorrow around him being here.

## 2015-09-21 ENCOUNTER — Inpatient Hospital Stay (HOSPITAL_COMMUNITY): Payer: Medicare Other | Admitting: Speech Pathology

## 2015-09-21 ENCOUNTER — Inpatient Hospital Stay (HOSPITAL_COMMUNITY): Payer: Medicare Other | Admitting: Occupational Therapy

## 2015-09-21 ENCOUNTER — Inpatient Hospital Stay (HOSPITAL_COMMUNITY): Payer: Medicare Other | Admitting: Physical Therapy

## 2015-09-21 NOTE — Progress Notes (Signed)
Subjective/Complaints:  No issues overnite  ROS:  No CP, SOB, N/V/D  Objective: Vital Signs: Blood pressure 131/62, pulse 50, temperature 98.3 F (36.8 C), temperature source Oral, resp. rate 18, height 5' 4"  (1.626 m), weight 62.333 kg (137 lb 6.7 oz), SpO2 100 %. No results found. No results found for this or any previous visit (from the past 72 hour(s)).    General: No acute distress Mood and affect are appropriate Heart: Regular rate and rhythm no rubs murmurs or extra sounds Lungs: Clear to auscultation, breathing unlabored, no rales or wheezes Abdomen: Positive bowel sounds, soft nontender to palpation, nondistended Extremities: No clubbing, cyanosis, or edema Skin: No evidence of breakdown, no evidence of rash Neurologic: Cranial nerves II through XII intact, motor strength is 5/5 in RIght and 3- Left deltoid, bicep, tricep, grip, 5/5 bilateral hip flexor, knee extensors, ankle dorsiflexor and plantar flexor Sensory exam normal sensation to light touch and proprioception in bilateral upper and lower extremities Cerebellar exam normal finger to nose to finger as well as heel to shin in bilateral upper and lower extremities Musculoskeletal: Full range of motion in all 4 extremities. No joint swelling   Assessment/Plan: 1. 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 Physiatrist is providing close team supervision and 24 hour management of active medical problems listed below. Physiatrist and rehab team continue to assess barriers to discharge/monitor patient progress toward functional and medical goals. FIM: Function - Bathing Position: Shower Body parts bathed by patient: Right arm, Left arm, Chest, Abdomen, Front perineal area, Left upper leg, Right upper leg, Right lower leg, Left lower leg, Buttocks Body parts bathed by helper: Back Assist Level:  Supervision or verbal cues, Set up Set up : To obtain items, To adjust water temperature  Function- Upper Body Dressing/Undressing What is the patient wearing?: Bra, Pull over shirt/dress Bra - Perfomed by patient: Thread/unthread right bra strap, Thread/unthread left bra strap Bra - Perfomed by helper: Hook/unhook bra (pull down sports bra) Pull over shirt/dress - Perfomed by patient: Thread/unthread left sleeve, Thread/unthread right sleeve, Put head through opening, Pull shirt over trunk Pull over shirt/dress - Perfomed by helper: Thread/unthread left sleeve, Pull shirt over trunk Assist Level: Touching or steadying assistance(Pt > 75%) Function - Lower Body Dressing/Undressing What is the patient wearing?: Pants, Underwear, Non-skid slipper socks Position:  (recliner) Underwear - Performed by patient: Thread/unthread right underwear leg, Pull underwear up/down Underwear - Performed by helper: Thread/unthread left underwear leg Pants- Performed by patient: Thread/unthread right pants leg, Pull pants up/down, Thread/unthread left pants leg Pants- Performed by helper: Thread/unthread left pants leg Non-skid slipper socks- Performed by patient: Don/doff right sock, Don/doff left sock Non-skid slipper socks- Performed by helper: Don/doff right sock, Don/doff left sock Socks - Performed by patient: Don/doff left sock, Don/doff right sock Socks - Performed by helper: Don/doff right sock Shoes - Performed by patient: Don/doff right shoe, Don/doff left shoe Shoes - Performed by helper: Don/doff right shoe, Don/doff left shoe Assist for footwear: Setup, Supervision/touching assist Assist for lower body dressing: Set up, Supervision or verbal cues, Touching or steadying assistance (Pt > 75%) Set up : To obtain clothing/put away  Function - Toileting Toileting steps completed by patient: Adjust clothing prior to toileting, Performs perineal hygiene, Adjust clothing after toileting Toileting  Assistive Devices: Grab bar or rail Assist level: Supervision or verbal cues  Function - Toilet Transfers Toilet transfer assistive device:  Grab bar Assist level to toilet: Supervision or verbal cues Assist level from toilet: Supervision or verbal cues  Function - Chair/bed transfer Chair/bed transfer method: Ambulatory Chair/bed transfer assist level: Supervision or verbal cues Chair/bed transfer assistive device: Armrests  Function - Locomotion: Wheelchair Will patient use wheelchair at discharge?: No Function - Locomotion: Ambulation Assistive device: No device Max distance: 250 Assist level: Supervision or verbal cues Assist level: Supervision or verbal cues Assist level: Supervision or verbal cues Assist level: Supervision or verbal cues Assist level: Touching or steadying assistance (Pt > 75%)  Function - Comprehension Comprehension: Auditory Comprehension assist level: Understands basic 75 - 89% of the time/ requires cueing 10 - 24% of the time  Function - Expression Expression: Verbal Expression assist level: Expresses basic 75 - 89% of the time/requires cueing 10 - 24% of the time. Needs helper to occlude trach/needs to repeat words.  Function - Social Interaction Social Interaction assist level: Interacts appropriately 75 - 89% of the time - Needs redirection for appropriate language or to initiate interaction.  Function - Problem Solving Problem solving assist level: Solves basic 50 - 74% of the time/requires cueing 25 - 49% of the time  Function - Memory Memory assist level: Recognizes or recalls 50 - 74% of the time/requires cueing 25 - 49% of the time Patient normally able to recall (first 3 days only): That he or she is in a hospital  Medical Problem List and Plan: 1.  Persistent frontal headaches left neglect and field cut generalized weakness with altered mental status secondary to traumatic [place]- Team conference today please see physician documentation  under team conference tab, met with team face-to-face to discuss problems,progress, and goals. Formulized individual treatment plan based on medical history, underlying problem and comorbidities. 2.  DVT Prophylaxis/Anticoagulation: SCDs. Monitor for any signs of DVT 3. Pain Management: Percocet as needed 4. 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 Follow-up outpatient interventional radiology Dr. [name] 5. Neuropsych: This patient is not capable of making decisions on her own behalf. 6. Skin/Wound Care: Routine skin checks 7. Fluids/Electrolytes/Nutrition: encourage po.  8. History of goblet cell carcinoid status post hemicolectomy. [facility] post d/c.appetite is good 9. Hyperlipidemia. Crestor 10. HTN: monitor and provide prns in accordance with increased physical exertion and pain 11. ABLA: Monitor and transfuse if necessary 12. Thrombocytopenia: Cont to follow platelets 13.  Sundowning still unsafe at noc per RN, Pt states she has 24/7 sup at home per son and husband LOS (Days) [place] E 09/21/2015, 8:20 AM

## 2015-09-21 NOTE — Patient Care Conference (Signed)
Inpatient RehabilitationTeam Conference and Plan of Care Update Date: 09/21/2015   Time: 10:55 AM    Patient Name: Andrea Keller      Medical Record Number: [id number]  Date of Birth: July 28, 1945 Sex: Female         Room/Bed: 4W25C/4W25C-01 Payor Info: Payor: [employer] MEDICARE / Plan: [facility] MEDICARE / Product Type: *No Product type* /    Admitting Diagnosis: traumatic ICH    Admit Date/Time:  09/14/2015  5:48 PM Admission Comments: No comment available   Primary Diagnosis:  <principal problem not specified> Principal Problem: <principal problem not specified>  Patient Active Problem List   Diagnosis Date Noted  . ICH (intracerebral hemorrhage) ([place]) 09/14/2015  . Transient alteration of awareness   . Other vascular headache   . Brain aneurysm   . Benign essential HTN   . Acute blood loss anemia   . Elevated troponin   . Essential hypertension   . HLD (hyperlipidemia)   . Malignant neoplasm of colon ([place])   . 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 Intraparenchymal hemorrhage of brain ([place]) 09/10/2015  . Microcytosis   . PVD (peripheral vascular disease) ([place])   . Hypertension   . Goblet cell carcinoid ([place]) 11/08/2014  . Thrombocytopenia ([place]) 11/08/2014  . Acute appendicitis 10/28/2014  . Perforated appendicitis 10/28/2014    Expected Discharge Date: Expected Discharge Date: 09/23/15  Team Members Present: Physician leading conference: Dr. Alysia Penna Social Worker Present: Ovidio Kin, LCSW Nurse Present: Dorthula Nettles, RN PT Present: Jorge Mandril, PT OT Present: Clyda Greener, OT SLP Present: Windell Moulding, SLP PPS Coordinator present : Daiva Nakayama, RN, CRRN     Current Status/Progress Goal Weekly Team Focus  Medical   poor safety awareness, + distractibilty, left field cut  Home d/c  Improve awareness, difficulty sequencing tasks   Bowel/Bladder   continent of bowel and bladder. Uragncy with bladder at times  Min assist  Continue POC with b/b    Swallow/Nutrition/ Hydration             ADL's   supervision with self-care tasks requiring min-mod cues for organization, attention to task, sequencing, initiation, problem solving  supervision  cognitive aspects of ADLs and IADLs, pt/family education   Mobility   supervision  supervision  family ed   Communication             Safety/Cognition/ Behavioral Observations  moderate cognitive deficits, left inattention, decreased sustained attention to tasks, poor awareness of deficits, poor task organization, min-mod assist for basic   min assist   family education    Pain   denies pain         Skin   bruising to BUE  free of skin infection min assist  assess skin q shift      *See Care Plan and progress notes for long and short-term goals.  Barriers to Discharge: poor awareness of field cut    Possible Resolutions to Barriers:  caregiver training    Discharge Planning/Teaching Needs:  Home with husband and son-has some cognitive issues and requires HD T,TH & Sat. Husband to be here today for family education      Team Discussion:  Goals of supervision level due to safety/impulsivity awareness issues. Mild sundowning in the evenings. Family education today with husband and son. L-visual field cut pt is not aware of. Husband will provide 24 hr supervision to pt.  Revisions to Treatment Plan:  none   Continued Need for Acute Rehabilitation Level of Care:  The patient requires daily medical management by a physician with specialized training in physical medicine and rehabilitation for the following conditions: Daily direction of a multidisciplinary physical rehabilitation program to ensure safe treatment while eliciting the highest outcome that is of practical value to the patient.: Yes Daily medical management of patient stability for increased activity during participation in an intensive rehabilitation regime.: Yes Daily analysis of laboratory values and/or radiology reports  with any subsequent need for medication adjustment of medical intervention for : Neurological problems  [place], Gardiner Rhyme 09/21/2015, 12:20 PM

## 2015-09-21 NOTE — Progress Notes (Signed)
Physical Therapy Session Note  Patient Details  Name: Andrea Keller MRN: [id number] Date of Birth: Nov 25, 1945  Today's Date: 09/21/2015 PT Individual Time: 1003-1100 PT Individual Time Calculation (min): 57 min   Short Term Goals: Week 1:  PT Short Term Goal 1 (Week 1): = LTGs due to anticipated LOS  Skilled Therapeutic Interventions/Progress Updates:    Pt received in recliner with husband Jenny Reichmann and son present; pt agreeable to PT denying c/o pain. Pt ambulated from room>rehab apartment & completed bed mobility with supervision, as pt required cuing for direction of rolling L. Pt transferred in/out of car with supervision & negotiated 12 steps with B railings with supervision. Pt's husband reports he will have B railings installed at home prior to pt's d/c. Pt ambulated from unit down to gift shop. PT provided cuing to utilize signs & directions throughout hospital; pt able to read sign but unable to apply directions to actions. Pt required cuing to attend to L & observe objects on L throughout gift shop.  PT educated pt's family on pt's difficulty with problem solving, spatial awareness, decreased safety awareness, L inattention, and decreased ability to apply knowledge to action. PT also educated pt's family that pt should not be left alone in home and should not cook 2/2 decreased safety awareness. Pt's husband verbalized understanding. At end of session pt ambulated to bathroom & completed task with supervision/mod I. At end of session pt left in recliner with QRB in place & family present to supervise.  Therapy Documentation Precautions:  Precautions Precautions: Fall Restrictions Weight Bearing Restrictions: No   Pain: Pain Assessment Pain Assessment: No/denies pain   See Function Navigator for Current Functional Status.   Therapy/Group: Individual Therapy  Waunita Schooner 09/21/2015, 5:22 PM

## 2015-09-21 NOTE — Progress Notes (Signed)
Occupational Therapy Session Note  Patient Details  Name: Andrea Keller MRN: [id number] Date of Birth: Nov 25, 1945  Today's Date: 09/21/2015 OT Individual Time: 1403-1430 OT Individual Time Calculation (min): 27 min    Skilled Therapeutic Interventions/Progress Updates:    Pt ambulated to the BI gym for work on visual scanning using the [employer].  She initially missed turning in the door to the room and needed cueing to stop and scan to her left side.  Once in the gym worked in standing on visual scanning.  She continues to need verbal cueing at a mod assist level to use a circular scanning pattern to locate items left of midline.  When using a program that lights up a target for 5 seconds she is able to locate 90% on the left, however if this is reduced to 3 seconds that the target is lit, accuracy decreases to 60-70 %.  She was able to ambulate back to her room with supervision and locate it on her own this session.  When returning to the bedside chair she was able to demonstrate some anticipatory awareness by stating that I needed to put the belt back on her.  She said she wears the safety belt so she won't get up because "they think I'm going to fall.  Pt left in bedside chair with call button and phone within reach, as well as with safety belt in place.   Therapy Documentation Precautions:  Precautions Precautions: Fall Restrictions Weight Bearing Restrictions: No  Pain: Pain Assessment Pain Assessment: No/denies pain ADL: ADL ADL Comments: Refer to functional navigator  See Function Navigator for Current Functional Status.   Therapy/Group: Individual Therapy  Allannah Kempen OTR/L 09/21/2015, 4:21 PM

## 2015-09-21 NOTE — Progress Notes (Signed)
Social Work  Discharge Note  The overall goal for the admission was met for:   Discharge location: [place]  Length of Stay: Yes-9 DAYS  Discharge activity level: Yes-SUPERVISION LEVEL  Home/community participation: Yes  Services provided included: MD, RD, PT, OT, SLP, RN, CM, TR, Pharmacy and SW  Financial Services: Private Insurance: [employer]  Follow-up services arranged: Home Health: [place] [facility]-PT,OT,RN,SP and Patient/Family has no preference for HH/DME agencies  Comments (or additional information):HUSBAND WAS HERE FOR FAMILY EDUCATION REALIZES PT REQUIRES 24 HR SUPERVISION AND WILL MAKE SURE SHE HAS THIS.   Patient/Family verbalized understanding of follow-up arrangements: Yes  Individual responsible for coordination of the follow-up plan: JOHN-HUSBAND  Confirmed correct DME delivered: Elease Hashimoto 09/21/2015    Elease Hashimoto

## 2015-09-21 NOTE — Progress Notes (Signed)
Speech Language Pathology Daily Session Note  Patient Details  Name: Andrea Keller MRN: [id number] Date of Birth: 05/02/1946  Today's Date: 09/21/2015 SLP Individual Time: 1130-1205; [id number] SLP Individual Time Calculation (min): 35 min; 25 min  Short Term Goals: Week 1: SLP Short Term Goal 1 (Week 1): Pt to demonstrate basic problem solving with min A of question/reasoning cueing. SLP Short Term Goal 2 (Week 1): Pt to complete attention task to locate items in the environment with min A of verbal cueing. SLP Short Term Goal 3 (Week 1): Pt to demonstrate O x 4 at mod I level with use of environmental cues. SLP Short Term Goal 4 (Week 1): Pt to verbalize 2 safety precautions related to new limitations with min verbal cues. SLP Short Term Goal 5 (Week 1): Pt to demonstrate functional reading at the sentence level at mod I level for increased time/self-correction.  Skilled Therapeutic Interventions:  Session 1: Pt was seen for skilled ST targeting cognitive goals and family education.  Pt's husband was not present for first half of therapy session.  SLP facilitated the session with a basic card game targeting visual scanning to the left of midline and sustained attention to task.  Pt was able to match cards from a field of 9 with min assist verbal cues to locate cards to the left of midline.  Pt was able to sustain her attention to task for ~5 minute intervals with min verbal cues for redirections.  Once pt's husband arrived, SLP provided skilled education regarding pt's current cognitive limitations and their function on pt's ability to complete basic, familiar self care tasks safely.  SLP provided pt's husband with handouts of distraction management techniques, memory aids, and cognitive reorganization activities to facilitate carryover in the home environment of compensatory strategy use.  SLP explicitly stated that pt was not to be left home alone for any period of time and that she was to have  assistance for medication and financial management at discharge.  All pt's and family's questions were answered at this time although SLP has concerns that pt's husband does not fully understand the scope of pt's cognitive limitations.  CSW made aware.  Pt left in recliner with family at bedside. Continue per current plan of care.   Session 2: Pt was seen for skilled ST targeting cognitive goals.  SLP facilitated the session with a previously taught board game targeting sustained attention, functional problem solving, and visual scanning to the left.  Pt required max assist verbal, tactile, and visual cues for recall of task rules and procedures, including turn taking, due to impulsivity and working memory deficits.  Min assist verbal cues were needed to locate and place pieces to the left of midline.  Pt was able to sustain her attention to tasks for ~2-3 min intervals with mod verbal cues for redirection.  Mod question cues needed to identify at least 1 physical deficit s/p CVA, Max assist needed to identify 1 cognitive deficit.  Pt was left in recliner with quick release belt donned and call bell left within reach.  Pt able to return demonstration for use of call bell with min question cues.  Continue per current plan of care.     Function:  Eating Eating                 Cognition Comprehension Comprehension assist level: Understands basic 75 - 89% of the time/ requires cueing 10 - 24% of the time  Expression   Expression assist  level: Expresses basic 75 - 89% of the time/requires cueing 10 - 24% of the time. Needs helper to occlude trach/needs to repeat words.  Social Interaction Social Interaction assist level: Interacts appropriately 75 - 89% of the time - Needs redirection for appropriate language or to initiate interaction.  Problem Solving Problem solving assist level: Solves basic 50 - 74% of the time/requires cueing 25 - 49% of the time  Memory Memory assist level: Recognizes or  recalls 50 - 74% of the time/requires cueing 25 - 49% of the time    Pain Pain Assessment Pain Assessment: No/denies pain  Therapy/Group: Individual Therapy  Zykeria Laguardia, Selinda Orion 09/21/2015, 3:48 PM

## 2015-09-21 NOTE — Progress Notes (Signed)
Social Work Patient ID: Andrea Keller, female   DOB: [birth year], 70 y.o.   MRN: [id number] Met with pt, husband and son to discuss team conference goals-supervision level and target discharge date 3/24. Husband here to go through therapies with pt and learn her care. Husband aware pt will need 24 hr supervision due to safety/impulsivity issues. He will make sure someone is with her when he transports their son to HD. Discussed home health follow up both in agreement, no equipment  Needs. Work on discharge Friday.

## 2015-09-21 NOTE — Progress Notes (Signed)
Occupational Therapy Session Note  Patient Details  Name: Andrea Keller MRN: [id number] Date of Birth: 02-12-1946  Today's Date: 09/21/2015 OT Individual Time: [id number] OT Individual Time Calculation (min): 60 min    Short Term Goals: Week 1:  OT Short Term Goal 1 (Week 1): STGs = LTGs  Skilled Therapeutic Interventions/Progress Updates:    ADL retraining with focus on functional mobility, Lt attention, organization and motor planning, and problem solving.  Pt's husband and son present for family education.  Discussed safety concerns with higher level activities such as meal preparation and house keeping tasks, recommending close supervision with those tasks and steering away from cooking tasks due to Lt inattention and impaired memory and processing.  Pt gathered all clothing with min cues for sequencing and ensuring all items then ambulated to room shower without assist.  Pt completed bathing at sit > stand level, demonstrating improved initiation and sequencing.  Dressing completed in bathroom with setup assist for LB dressing to ensure proper sequence with underwear.  While donning shirt, pt stood and walked around bathroom with shirt half on, when asked pt stated "I don't know" and required cue to redirect to completing dressing task.  Oral care completed with min cues to terminate task as pt attempting to walk away from sink still carrying tooth brush in Lt hand.  Reiterated importance of supervision with pt's husband and son with all mobility and self-care tasks due to Lt inattention and impaired cognition - both reporting understanding.  Therapy Documentation Precautions:  Precautions Precautions: Fall Restrictions Weight Bearing Restrictions: No Pain: Pain Assessment Pain Assessment: No/denies pain  See Function Navigator for Current Functional Status.   Therapy/Group: Individual Therapy  Simonne Come 09/21/2015, 10:21 AM

## 2015-09-22 ENCOUNTER — Inpatient Hospital Stay (HOSPITAL_COMMUNITY): Payer: Medicare Other | Admitting: Speech Pathology

## 2015-09-22 ENCOUNTER — Inpatient Hospital Stay (HOSPITAL_COMMUNITY): Payer: Medicare Other

## 2015-09-22 ENCOUNTER — Inpatient Hospital Stay (HOSPITAL_COMMUNITY): Payer: Medicare Other | Admitting: Occupational Therapy

## 2015-09-22 MED ORDER — DEXAMETHASONE 6 MG PO TABS
6.0000 mg | ORAL_TABLET | Freq: Two times a day (BID) | ORAL | Status: DC
  Administered 2015-09-22 – 2015-09-23 (×3): 6 mg via ORAL
  Filled 2015-09-22 (×3): qty 1

## 2015-09-22 NOTE — Progress Notes (Signed)
Subjective/Complaints:  No issues overnite, pt not aware of planned d/c in am  ROS:  No CP, SOB, N/V/D  Objective: Vital Signs: Blood pressure 132/58, pulse 55, temperature 97.4 F (36.3 C), temperature source Oral, resp. rate 18, height 5\' 4"  (1.626 m), weight 64.683 kg (142 lb 9.6 oz), SpO2 100 %. No results found. No results found for this or any previous visit (from the past 72 hour(s)).    General: No acute distress Mood and affect are appropriate Heart: Regular rate and rhythm no rubs murmurs or extra sounds Lungs: Clear to auscultation, breathing unlabored, no rales or wheezes Abdomen: Positive bowel sounds, soft nontender to palpation, nondistended Extremities: No clubbing, cyanosis, or edema Skin: No evidence of breakdown, no evidence of rash Neurologic: Cranial nerves II through XII intact, motor strength is 5/5 in RIght and 3- Left deltoid, bicep, tricep, grip, 5/5 bilateral hip flexor, knee extensors, ankle dorsiflexor and plantar flexor Sensory exam normal sensation to light touch and proprioception in bilateral upper and lower extremities Cerebellar exam normal finger to nose to finger as well as heel to shin in bilateral upper and lower extremities Musculoskeletal: Full range of motion in all 4 extremities. No joint swelling   Assessment/Plan: 1. 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 Physiatrist is providing close team supervision and 24 hour management of active medical problems listed below. Physiatrist and rehab team continue to assess barriers to discharge/monitor patient progress toward functional and medical goals. FIM: Function - Bathing Position: Shower Body parts bathed by patient: Right arm, Left arm, Chest, Abdomen, Front perineal area, Left upper leg, Right upper leg, Right lower leg, Left lower leg, Buttocks Body parts bathed by  helper: Back Assist Level: Supervision or verbal cues, Set up Set up : To obtain items, To adjust water temperature  Function- Upper Body Dressing/Undressing What is the patient wearing?: Bra, Pull over shirt/dress Bra - Perfomed by patient: Thread/unthread right bra strap, Thread/unthread left bra strap Bra - Perfomed by helper: Hook/unhook bra (pull down sports bra) Pull over shirt/dress - Perfomed by patient: Thread/unthread left sleeve, Thread/unthread right sleeve, Put head through opening, Pull shirt over trunk Pull over shirt/dress - Perfomed by helper: Thread/unthread left sleeve, Pull shirt over trunk Assist Level: Touching or steadying assistance(Pt > 75%), Supervision or verbal cues Function - Lower Body Dressing/Undressing What is the patient wearing?: Pants, Underwear, Socks, Shoes Position:  (sitting in chair in bathroom) Underwear - Performed by patient: Thread/unthread right underwear leg, Thread/unthread left underwear leg, Pull underwear up/down Underwear - Performed by helper: Thread/unthread left underwear leg Pants- Performed by patient: Thread/unthread right pants leg, Pull pants up/down, Thread/unthread left pants leg Pants- Performed by helper: Thread/unthread left pants leg Non-skid slipper socks- Performed by patient: Don/doff right sock, Don/doff left sock Non-skid slipper socks- Performed by helper: Don/doff right sock, Don/doff left sock Socks - Performed by patient: Don/doff left sock, Don/doff right sock Socks - Performed by helper: Don/doff right sock Shoes - Performed by patient: Don/doff right shoe, Don/doff left shoe Shoes - Performed by helper: Don/doff right shoe, Don/doff left shoe Assist for footwear: Setup, Supervision/touching assist Assist for lower body dressing: Set up, Supervision or verbal cues Set up : To obtain clothing/put away  Function - Toileting Toileting steps completed by patient: Adjust clothing prior to toileting, Performs perineal  hygiene, Adjust clothing after toileting Toileting Assistive Devices: Grab bar or rail Assist level: Supervision  or verbal cues  Function Midwife transfer assistive device: Grab bar Assist level to toilet: Supervision or verbal cues Assist level from toilet: Supervision or verbal cues  Function - Chair/bed transfer Chair/bed transfer method: Ambulatory Chair/bed transfer assist level: Supervision or verbal cues Chair/bed transfer assistive device: Armrests  Function - Locomotion: Wheelchair Will patient use wheelchair at discharge?: No Function - Locomotion: Ambulation Assistive device: No device Max distance: >300 ft Assist level: Supervision or verbal cues Assist level: Supervision or verbal cues Assist level: Supervision or verbal cues Assist level: Supervision or verbal cues Assist level: Touching or steadying assistance (Pt > 75%)  Function - Comprehension Comprehension: Auditory Comprehension assist level: Understands basic 75 - 89% of the time/ requires cueing 10 - 24% of the time  Function - Expression Expression: Verbal Expression assist level: Expresses basic 75 - 89% of the time/requires cueing 10 - 24% of the time. Needs helper to occlude trach/needs to repeat words.  Function - Social Interaction Social Interaction assist level: Interacts appropriately 75 - 89% of the time - Needs redirection for appropriate language or to initiate interaction.  Function - Problem Solving Problem solving assist level: Solves basic 50 - 74% of the time/requires cueing 25 - 49% of the time  Function - Memory Memory assist level: Recognizes or recalls 50 - 74% of the time/requires cueing 25 - 49% of the time Patient normally able to recall (first 3 days only): That he or she is in a hospital  Medical Problem List and Plan: 1.  Persistent frontal headaches left neglect and field cut generalized weakness with altered mental status secondary to traumatic ICH- Plan  d/c in am      2.  DVT Prophylaxis/Anticoagulation: SCDs. Monitor for any signs of DVT 3. Pain Management: Percocet as needed 4. 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 Follow-up outpatient interventional radiology Dr. [name] 5. Neuropsych: This patient is not capable of making decisions on her own behalf. 6. Skin/Wound Care: Routine skin checks 7. Fluids/Electrolytes/Nutrition: encourage po.  8. History of goblet cell carcinoid status post hemicolectomy. [facility] post d/c.appetite is good 9. Hyperlipidemia. Crestor 10. HTN: monitor and provide prns in accordance with increased physical exertion and pain 11. ABLA: Monitor and transfuse if necessary 12. Thrombocytopenia: Cont to follow platelets 13.  Sundowning still unsafe at noc per RN, Pt states she has 24/7 sup at home per son and husband LOS (Days) [place] E 09/22/2015, 8:24 AM

## 2015-09-22 NOTE — Progress Notes (Signed)
Speech Language Pathology Daily Session Note  Patient Details  Name: SIMARA NADOLNY MRN: [id number] Date of Birth: 11-07-1945  Today's Date: 09/22/2015 SLP Individual Time: 1100-1200 SLP Individual Time Calculation (min): 60 min  Short Term Goals: Week 1: SLP Short Term Goal 1 (Week 1): Pt to demonstrate basic problem solving with min A of question/reasoning cueing. SLP Short Term Goal 2 (Week 1): Pt to complete attention task to locate items in the environment with min A of verbal cueing. SLP Short Term Goal 3 (Week 1): Pt to demonstrate O x 4 at mod I level with use of environmental cues. SLP Short Term Goal 4 (Week 1): Pt to verbalize 2 safety precautions related to new limitations with min verbal cues. SLP Short Term Goal 5 (Week 1): Pt to demonstrate functional reading at the sentence level at mod I level for increased time/self-correction.  Skilled Therapeutic Interventions:  Pt was seen for skilled ST targeting cognitive goals.  Pt required mod assist verbal cues for task organization when gathering items prior to leaving the room due to perseveration.  Pt ambulated to ST treatment room with overall mod assist verbal cues to attend to the left of the environment and for route recall.  SLP facilitated the session with a basic matching task targeting scanning to the left of midline and sustained attention to task.  Pt initially required max assist verbal cues to sustain attention to task for 30 seconds and max assist verbal cues to monitor and correct perseverative errors; however, as task progressed pt was able to match items between left and right visual fields with overall min assist verbal cues for task organization.  By the end of task pt was also able to sustain her attention for ~1-2 minute intervals with min verbal cues for redirection.  Pt ambulated back to room with supervision cues for route recall.  Pt was left in recliner with quick release belt donned.  Call bell left within reach.   Continue per current plan of care.    Function:  Eating Eating                 Cognition Comprehension Comprehension assist level: Understands basic 75 - 89% of the time/ requires cueing 10 - 24% of the time  Expression   Expression assist level: Expresses basic 75 - 89% of the time/requires cueing 10 - 24% of the time. Needs helper to occlude trach/needs to repeat words.  Social Interaction Social Interaction assist level: Interacts appropriately 75 - 89% of the time - Needs redirection for appropriate language or to initiate interaction.  Problem Solving Problem solving assist level: Solves basic 50 - 74% of the time/requires cueing 25 - 49% of the time  Memory Memory assist level: Recognizes or recalls 50 - 74% of the time/requires cueing 25 - 49% of the time    Pain Pain Assessment Pain Assessment: No/denies pain  Therapy/Group: Individual Therapy  Copeland Lapier, Selinda Orion 09/22/2015, 6:29 PM

## 2015-09-22 NOTE — Plan of Care (Signed)
Problem: RH Cognition - SLP Goal: RH LTG Patient will demonstrate orientation with cues LTG: Patient will demonstrate orientation to person/place/time/situation with cues (SLP)  Outcome: Not Met (add Reason) Min assist needed for orientation to time   Problem: RH Problem Solving Goal: LTG Patient will demonstrate problem solving for (SLP) LTG: Patient will demonstrate problem solving for basic/complex daily situations with cues (SLP)  Outcome: Not Met (add Reason) Mod assist needed for basic functional problem solving  Problem: RH Awareness Goal: LTG: Patient will demonstrate intellectual/emergent (SLP) LTG: Patient will demonstrate intellectual/emergent/anticipatory awareness with assist during a cognitive/linguistic activity (SLP)  Outcome: Not Met (add Reason) Mod assist needed for intellectual awareness of deficits

## 2015-09-22 NOTE — Progress Notes (Signed)
Occupational Therapy Discharge Summary  Patient Details  Name: Andrea Keller MRN: [id number] Date of Birth: 1945-09-30  Patient has met 66 of 10 long term goals due to improved balance, ability to compensate for deficits, functional use of  LEFT upper and LEFT lower extremity, improved attention and improved awareness.  Patient to discharge at overall Supervision level.  Patient's care partner is independent to provide the necessary cognitive assistance at discharge.  Pt's husband and son have attended therapy sessions to address family education with focus on safety as pt requires supervision/cues for attention and problem solving with functional tasks.  Both report they are willing and able to provide the necessary supervision/cues.  Reasons goals not met: NA  Recommendation:  Patient will benefit from ongoing skilled OT services in home health setting to continue to advance functional skills in the area of BADL, iADL and Reduce care partner burden.  Equipment: No equipment provided  Reasons for discharge: treatment goals met and discharge from hospital  Patient/family agrees with progress made and goals achieved: Yes  OT Discharge Precautions/Restrictions  Precautions Precautions: Fall General OT Amount of Missed Time: 10 Minutes Vital Signs Therapy Vitals Temp: 99.1 F (37.3 C) Temp Source: Oral Pulse Rate: 60 Resp: 18 BP: (!) 149/57 mmHg Patient Position (if appropriate): Sitting Oxygen Therapy SpO2: 100 % O2 Device: Not Delivered Pain Pain Assessment Pain Assessment: No/denies pain ADL ADL ADL Comments: Refer to functional navigator Vision/Perception  Vision- History Baseline Vision/History: Wears glasses Wears Glasses: At all times Patient Visual Report: No change from baseline Vision- Assessment Vision Assessment?: Yes Eye Alignment: Within Functional Limits Alignment/Gaze Preference: Gaze right Visual Fields: Left homonymous hemianopsia Additional  Comments: Lt inattention  Cognition Overall Cognitive Status: Impaired/Different from baseline Arousal/Alertness: Awake/alert Attention: Sustained Sustained Attention: Impaired Sustained Attention Impairment: Verbal basic;Functional basic Memory: Impaired Memory Impairment: Decreased recall of new information;Retrieval deficit Awareness: Impaired Awareness Impairment: Emergent impairment Problem Solving: Impaired Problem Solving Impairment: Verbal basic;Functional basic Sequencing: Impaired Sequencing Impairment: Functional basic Organizing: Impaired Organizing Impairment: Functional basic Behaviors: Restless (pt is very "active" and used to being up on her feet much of the day) Sensation Sensation Light Touch: Appears Intact Stereognosis: Not tested Hot/Cold: Appears Intact Proprioception: Impaired Detail Proprioception Impaired Details: Impaired LUE;Impaired LLE Coordination Gross Motor Movements are Fluid and Coordinated: Yes Fine Motor Movements are Fluid and Coordinated: No Finger Nose Finger Test: slow movement but accurate, requiring cues to complete assessment Motor    Mobility     Trunk/Postural Assessment     Balance Berg Balance Test Sit to Stand: Able to stand without using hands and stabilize independently Standing Unsupported: Able to stand safely 2 minutes Sitting with Back Unsupported but Feet Supported on Floor or Stool: Able to sit safely and securely 2 minutes Stand to Sit: Sits safely with minimal use of hands Transfers: Able to transfer safely, minor use of hands Standing Unsupported with Eyes Closed: Able to stand 10 seconds safely Standing Ubsupported with Feet Together: Able to place feet together independently and stand 1 minute safely From Standing, Reach Forward with Outstretched Arm: Can reach forward >12 cm safely (5") From Standing Position, Pick up Object from Floor: Able to pick up shoe safely and easily From Standing Position, Turn to  Look Behind Over each Shoulder: Looks behind one side only/other side shows less weight shift Turn 360 Degrees: Able to turn 360 degrees safely but slowly Standing Unsupported, Alternately Place Feet on Step/Stool: Able to complete >2 steps/needs minimal assist Standing Unsupported, One  Foot in Front: Able to plae foot ahead of the other independently and hold 30 seconds Dynamic Gait Index Level Surface: Mild Impairment Change in Gait Speed: Moderate Impairment Gait with Horizontal Head Turns: Mild Impairment Gait with Vertical Head Turns: Mild Impairment Gait and Pivot Turn: Moderate Impairment Step Over Obstacle: Moderate Impairment Step Around Obstacles: Moderate Impairment Steps: Moderate Impairment Total Score: 11 Extremity/Trunk Assessment RUE Assessment RUE Assessment: Within Functional Limits LUE Assessment LUE Assessment: Exceptions to [facility] (full ROM and strength grossly 4-/5, but limited attention to LUE, awareness and proprioception)   See Function Navigator for Current Functional Status.  Simonne Come 09/22/2015, 4:19 PM

## 2015-09-22 NOTE — Progress Notes (Signed)
Speech Language Pathology Discharge Summary  Patient Details  Name: Andrea Keller MRN: [id number] Date of Birth: May 24, 1946   Patient has met 2 of 5 long term goals.  Patient to discharge at overall Mod level.  Reasons goals not met: Pt is mod assist for intellectual awareness of deficits and basic functional problem solving    Clinical Impression/Discharge Summary:  Pt made inconsistent gains while inpatient and is discharging having met 2 out of 5 short term goals.  Pt is mod assist for basic cognitive tasks due to impairments in sustained attention to tasks, intellectual awareness of deficits, left inattention, basic functional problem solving, impulsivity, and perseveration.  Pt is discharging home with 24/7 supervision from family and ST follow up at next level of care to continue addressing cognitive deficits.  Pt and family education is complete but recommend ongoing education at next level of care to maximize safety in the home environment due to the severity of pt's deficits.     Care Partner:  Caregiver Able to Provide Assistance: Yes  Type of Caregiver Assistance: Physical;Cognitive  Recommendation:  Outpatient SLP;24 hour supervision/assistance;Home Health SLP  Rationale for SLP Follow Up: Maximize cognitive function and independence;Reduce caregiver burden   Equipment: none recommended by  SLP    Reasons for discharge: Discharged from hospital   Patient/Family Agrees with Progress Made and Goals Achieved: Yes     Earon Rivest, Selinda Orion 09/22/2015, 6:36 PM

## 2015-09-22 NOTE — Discharge Summary (Signed)
[name], BORROEL NO.:  [id number]  MEDICAL RECORD NO.:  [id number]  LOCATION:  4W25C                        FACILITY:  [facility]  PHYSICIAN:  Lauraine Rinne, P.A.  DATE OF BIRTH:  Dec 16, 1945  DATE OF ADMISSION:  09/14/2015 DATE OF DISCHARGE:  09/23/2015                              DISCHARGE SUMMARY   DISCHARGE DIAGNOSES: 1. Traumatic ICH. 2. SCDs for DVT prophylaxis. 3. 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  Plan follow up     outpatient Intervention Radiology. 4. History of goblet carcinoid status post hemicolectomy. 5. Hyperlipidemia. 6. Hypertension. 7. Acute blood loss anemia. 8. Thrombocytopenia.  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  Lives with spouse.  Presented September 10, 2015, with persistent frontal headaches, generalized weakness, as well as difficulty in concentrating.  She did have a recent fall September 09, 2015 which she hit her head on a dresser. CT of the head showed a 2.5 cm hemorrhage within the right frontal lobe with extensive white matter hypodensity throughout the right cerebral hemisphere.  MRI completed showed right frontal lobe 5.3 x 3.3 cm hemorrhage with disproportionate marginal enhancement and mass effect. Right frontoparietal subarachnoid hemorrhage.  EEG negative for seizure. CT of the chest negative.  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  Neurology consulted.  Repeat MRI in 2-4 weeks for ongoing evaluation of either hemorrhagic metastasis or hemorrhagic infarct. Decadron protocol.  Mildly elevated troponins with diffuse T-wave inversions likely due to [place].  Echocardiogram September 13, 2015, with ejection fraction of [id number] grade 1  diastolic dysfunction.  No cardiac source of emboli.  Tolerating a regular diet.  Physical and occupational therapy ongoing.  The patient was admitted for comprehensive rehab program.  PAST MEDICAL HISTORY:  See discharge diagnoses.  SOCIAL HISTORY:  Lives with spouse, independent prior to admission. Functional status upon admission to rehab services.  Minimal assist ambulate 300 feet.  Minimal assist sit to stand.  Min mod assist activities of daily living.  PHYSICAL EXAMINATION:  VITAL SIGNS:  Blood pressure 155/68, pulse 97, respirations 18. GENERAL:  This was an alert female, oriented to person, place, and time. LUNGS:  Clear to auscultation.  No wheeze. CARDIAC:  Regular rate and rhythm.  No murmur. ABDOMEN:  Soft, nontender.  Good bowel sounds.  REHABILITATION HOSPITAL COURSE:  The patient was admitted to inpatient rehab services with therapies initiated on a 3-hour daily basis, consisting of physical therapy, occupational therapy, speech therapy, and rehabilitation nursing.  The following issues were addressed during the patient's rehabilitation stay.  Pertaining to [name]'s traumatic ICH, she would follow up with Neurology Services.  Plan was to repeat MRI for any ongoing hemorrhagic metastasis or hemorrhagic infarct.  SCDs for DVT prophylaxis.  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  She did have a history of goblet  cell carcinoid, had undergone hemicolectomy in May, 2016.  She would follow up with [facility] where she was followed prior to admission.  Blood pressures remained well controlled and monitored.  She was slowly weaned from Decadron therapy.  The patient received weekly collaborative interdisciplinary team conferences to discuss estimated length of stay, family teaching, and any barriers to discharge.  The patient ambulated from her room to  the rehab apartment, completed bed mobility with supervision.  Required cuing for some direction. Transferred in and out of car with supervision.  Negotiated 12 steps with supervision.  Patient educated on safety awareness problem solving and left inattention.  She could gather her belongings for activities of daily living and homemaking.  She did need some extra time to complete tasks.  She was able to ambulate to her room with supervision, locate it and her next session of therapy.  Full family teaching was completed and plan discharge to home.  DISCHARGE MEDICATIONS: 1. Norvasc 5 mg p.o. daily. 2. Decadron taper as advised. 3. Ferrous sulfate 325 mg p.o. daily. 4. Oxycodone 1-2 tablets every 4 hours as needed pain, dispense of 60     tablets. 5. Protonix 40 mg p.o. daily. 6. MiraLAX daily, hold for loose stool. 7. Crestor 10 mg p.o. daily.  DIET:  Diet was regular.  Specal instructions: Follow-up neurosurgery Dr. Saintclair Halsted on taper of Decadron  Patient would follow up Dr. Alysia Penna at the outpatient rehab center as directed; Dr. [name] Interventional Radiology for evaluation of paraclinoid aneurysm, Dr. Erlinda Hong Neurology Services one month, Dr. Lindi Adie of oncology services call for appointment, Dr. Kary Kos Neurosurgery call for appointment and Dr. Nolene Ebbs medical management.     Lauraine Rinne, P.A.     DA/MEDQ  D:  09/22/2015  T:  09/22/2015  Job:  [id number]  cc:   Sanjeev K. [name], M.D. ANDREW E Antionette Char, M.D. Kary Kos, M.D. Tawni Levy, MD

## 2015-09-22 NOTE — Discharge Summary (Signed)
Discharge summary job [id number]

## 2015-09-22 NOTE — Progress Notes (Signed)
Physical Therapy Discharge Summary  Patient Details  Name: Andrea Keller MRN: [id number] Date of Birth: 1945/10/11  Today's Date: 09/22/2015 PT Individual Time: 1400-1500 PT Individual Time Calculation (min): 60 min  Patient seen for therapy session for purpose of completing discharge activities.  Did touch base with patient's spouse about follow up, rails that are now installed on entry stairs and reinforced need for 24 hour supervision.  Spouse was without concerns or questions at this time.   Patient has met 8 of 8 long term goals due to improved activity tolerance, increased strength and ability to compensate for deficits.  Patient to discharge at an ambulatory level Supervision.   Patient's care partner is independent  to provide the necessary cognitive assistance at discharge.  Reasons goals not met: N/A   Recommendation:  Patient will benefit from ongoing skilled PT services in home health setting to continue to advance safe functional mobility, address ongoing impairments in cognition, safety awareness, L attention and general strength/balance, and minimize fall risk.  Equipment: No equipment provided  Reasons for discharge: treatment goals met  Patient/family agrees with progress made and goals achieved: Yes  PT Discharge Precautions/Restrictions Precautions Precautions: Fall Pain Pain Assessment Pain Assessment: No/denies pain Vision/Perception  Vision - Assessment Eye Alignment: Within Functional Limits Alignment/Gaze Preference: Gaze right Additional Comments: Lt inattention  Cognition Overall Cognitive Status: Impaired/Different from baseline Arousal/Alertness: Awake/alert Orientation Level: Oriented to person;Oriented to place;Oriented to situation;Disoriented to time Attention: Sustained Sustained Attention: Impaired Sustained Attention Impairment: Verbal basic;Functional basic Memory: Impaired Memory Impairment: Decreased recall of new information;Retrieval  deficit Awareness: Impaired Awareness Impairment: Emergent impairment Problem Solving: Impaired Problem Solving Impairment: Verbal basic;Functional basic Sequencing: Impaired Sequencing Impairment: Functional basic Organizing: Impaired Organizing Impairment: Functional basic Behaviors: Restless (pt is very "active" and used to being up on her feet much of the day) Sensation Sensation Light Touch: Appears Intact Stereognosis: Not tested Hot/Cold: Appears Intact Proprioception: Impaired Detail Proprioception Impaired Details: Impaired LUE;Impaired LLE Coordination Gross Motor Movements are Fluid and Coordinated: Yes Fine Motor Movements are Fluid and Coordinated: No Finger Nose Finger Test: slow movement but accurate, requiring cues to complete assessment Heel Shin Test: demonstrated without difficulty as well as toe tapping Motor  Motor Motor: Motor apraxia Motor - Discharge Observations: continues to have difficulty completing a turn and orienting herself correctly for car transfer without mod cues  Mobility Bed Mobility Bed Mobility: Rolling Right;Rolling Left;Sit to Supine;Supine to Sit Rolling Right: 6: Modified independent (Device/Increase time) Rolling Left: 6: Modified independent (Device/Increase time) Supine to Sit: 6: Modified independent (Device/Increase time) Sit to Supine: 6: Modified independent (Device/Increase time) Transfers Sit to Stand: 6: Modified independent (Device/Increase time) Stand to Sit: 6: Modified independent (Device/Increase time) Stand Pivot Transfers: 6: Modified independent (Device/Increase time) Locomotion  Ambulation Ambulation/Gait Assistance: 5: Supervision Ambulation Distance (Feet): 300 Feet Assistive device: None Gait Gait Pattern: Impaired Gait Pattern: Shuffle;Trunk flexed;Poor foot clearance - left;Poor foot clearance - right;Step-through pattern;Decreased stride length;Decreased dorsiflexion - left;Decreased dorsiflexion -  right;Scissoring Gait velocity: 10 MWT = 0.33 m/s Stairs / Additional Locomotion Stairs: Yes Stairs Assistance: 5: Supervision Stair Management Technique: Two rails;Forwards;Step to pattern Number of Stairs: 12 Height of Stairs: 6 Curb: 4: Min Administrator Mobility: No  Trunk/Postural Assessment  Cervical Assessment Cervical Assessment: Exceptions to [facility] (forward head) Thoracic Assessment Thoracic Assessment: Exceptions to [facility] (increased kyphosis) Lumbar Assessment Lumbar Assessment: Exceptions to [facility] (posterior tilt) Postural Control Postural Control: Within Functional Limits  Balance Balance Balance Assessed: Yes Standardized  Balance Assessment Standardized Balance Assessment: Berg Balance Test;Dynamic Gait Index Berg Balance Test Sit to Stand: Able to stand without using hands and stabilize independently Standing Unsupported: Able to stand safely 2 minutes Sitting with Back Unsupported but Feet Supported on Floor or Stool: Able to sit safely and securely 2 minutes Stand to Sit: Sits safely with minimal use of hands Transfers: Able to transfer safely, minor use of hands Standing Unsupported with Eyes Closed: Able to stand 10 seconds safely Standing Ubsupported with Feet Together: Able to place feet together independently and stand 1 minute safely From Standing, Reach Forward with Outstretched Arm: Can reach forward >12 cm safely (5") From Standing Position, Pick up Object from Floor: Able to pick up shoe safely and easily From Standing Position, Turn to Look Behind Over each Shoulder: Looks behind one side only/other side shows less weight shift Turn 360 Degrees: Able to turn 360 degrees safely but slowly Standing Unsupported, Alternately Place Feet on Step/Stool: Able to complete >2 steps/needs minimal assist Standing Unsupported, One Foot in Front: Able to plae foot ahead of the other independently and hold 30 seconds Standing on One Leg: Tries to  lift leg/unable to hold 3 seconds but remains standing independently Total Score: 45 Dynamic Gait Index Level Surface: Mild Impairment Change in Gait Speed: Moderate Impairment Gait with Horizontal Head Turns: Mild Impairment Gait with Vertical Head Turns: Mild Impairment Gait and Pivot Turn: Moderate Impairment Step Over Obstacle: Moderate Impairment Step Around Obstacles: Moderate Impairment Steps: Moderate Impairment Total Score: 11 Dynamic Standing Balance Dynamic Standing - Level of Assistance: 6: Modified independent (Device/Increase time) Dynamic Standing - Balance Activities: Reaching for objects Extremity Assessment  RUE Assessment RUE Assessment: Within Functional Limits LUE Assessment LUE Assessment: Exceptions to [facility] (full ROM and strength grossly 4-/5, but limited attention to LUE, awareness and proprioception) RLE Assessment RLE Assessment: Within Functional Limits LLE Assessment LLE Assessment: Within Functional Limits   See Function Navigator for Current Functional Status.  Reginia Naas 09/22/2015, 4:42 PM  Magda Kiel, [place] 09/22/2015

## 2015-09-22 NOTE — Progress Notes (Signed)
Occupational Therapy Session Note  Patient Details  Name: Andrea Keller MRN: [id number] Date of Birth: July 07, 1945  Today's Date: 09/22/2015 OT Individual Time: [id number] and [id number] OT Individual Time Calculation (min): 25 min    Short Term Goals: Week 1:  OT Short Term Goal 1 (Week 1): STGs = LTGs  Skilled Therapeutic Interventions/Progress Updates:    1) Completed ADL retraining at overall supervision level with focus on Lt attention, functional mobility, motor planning, and problem solving.  Pt ambulated into bathroom with majority of clothing items already gathered.  Required cues for sequencing with undressing prior to bathing as pt starting to take off pants, then walking out of bathroom, then trying to turn on water while still dressed in shower.  When asked where pt was going, pt stated "I don't know, oh yeah, I'm going to take a shower" and returned to bathroom. Bathing completed at sit > stand level in room shower with increased initiation and sequencing and decreased perseveration with bathing perineal area repeatedly.  Improved sequencing with LB dressing, requiring min cues for problem solving when attempting to don pants prior to underwear and tactile cues to direct attention to LLE already threaded through pants despite verbally repeating what therapist stated.  Oral care completed in standing at sink with improved sequencing and termination of task.  2) Treatment session with focus on Lt attention with mobility and seated table top task.  Pt finishing snack upon arrival, ambulated to sink to wash hands and clean up snack with supervision with mobility and cues to throw away trash when walking past trash can.  Ambulated to Dayroom with overall supervision, requiring min cues to scan to Lt prior to turning to Lt.  Engaged in matching activity with colored blocks placed on Lt of grid.  Pt required min cues to scan to Lt to obtain items and would fill in grid with matching colors on Rt,  often leaving Lt side of page blank.  Pt passed off to PT.  Therapy Documentation Precautions:  Precautions Precautions: Fall Restrictions Weight Bearing Restrictions: No General: General OT Amount of Missed Time: 10 Minutes Vital Signs: Therapy Vitals Temp: 99.1 F (37.3 C) Temp Source: Oral Pulse Rate: 60 Resp: 18 BP: (!) 149/57 mmHg Patient Position (if appropriate): Sitting Oxygen Therapy SpO2: 100 % O2 Device: Not Delivered Pain:  Pt with no c/o pain  See Function Navigator for Current Functional Status.   Therapy/Group: Individual Therapy  Simonne Come 09/22/2015, 4:00 PM

## 2015-09-23 DIAGNOSIS — I69319 Unspecified symptoms and signs involving cognitive functions following cerebral infarction: Secondary | ICD-10-CM

## 2015-09-23 MED ORDER — DEXAMETHASONE 6 MG PO TABS: 6.0000 mg | ORAL_TABLET | Freq: Two times a day (BID) | ORAL | Status: DC

## 2015-09-23 MED ORDER — POLYETHYLENE GLYCOL 3350 17 G PO PACK: 17.0000 g | PACK | Freq: Every day | ORAL | Status: DC

## 2015-09-23 MED ORDER — ROSUVASTATIN CALCIUM 10 MG PO TABS: 10.0000 mg | ORAL_TABLET | Freq: Every day | ORAL | Status: AC

## 2015-09-23 MED ORDER — OXYCODONE-ACETAMINOPHEN 5-325 MG PO TABS: 1.0000 | ORAL_TABLET | ORAL | Status: DC | PRN

## 2015-09-23 MED ORDER — PANTOPRAZOLE SODIUM 40 MG PO TBEC: 40.0000 mg | DELAYED_RELEASE_TABLET | Freq: Every day | ORAL | Status: AC

## 2015-09-23 MED ORDER — AMLODIPINE BESYLATE 10 MG PO TABS: 5.0000 mg | ORAL_TABLET | Freq: Every day | ORAL | Status: DC

## 2015-09-23 MED ORDER — FERROUS SULFATE 325 (65 FE) MG PO TABS: 325.0000 mg | ORAL_TABLET | Freq: Every day | ORAL | Status: AC

## 2015-09-23 NOTE — Progress Notes (Signed)
PA discharged patient

## 2015-09-23 NOTE — Discharge Instructions (Signed)
Inpatient Rehab Discharge Instructions  [place] Discharge date and time: No discharge date for patient encounter.   Activities/Precautions/ Functional Status: Activity: activity as tolerated Diet: regular diet Wound Care: none needed Functional status:  ___ No restrictions     ___ Walk up steps independently ___ 24/7 supervision/assistance   ___ Walk up steps with assistance ___ Intermittent supervision/assistance  ___ Bathe/dress independently ___ Walk with walker     ___ Bathe/dress with assistance ___ Walk Independently    ___ Shower independently _x__ Walk with assistance    ___ Shower with assistance ___ No alcohol     ___ Return to work/school ________  Special Instructions: Follow-up with interventional radiology DR Ihor Gully [phone] in regards to left paraclinoid aneurysm and plan of care   Follow-up MRI of the brain in approximately one month  Follow up neurosurgery DR CRAM on taper of decadron   COMMUNITY REFERRALS UPON DISCHARGE:    Home Health:   PT,OT,SP,RN    [place]   Date of last service:09/23/2015  Medical Equipment/Items [place] PATIENT/FAMILY: Support Groups:[facility] [place] @ 3:00-4:00PM ON THE [place] [phone]  My questions have been answered and I understand these instructions. I will adhere to these goals and the provided educational materials after my discharge from the hospital.  Patient/Caregiver Signature _______________________________ Date __________  Clinician Signature _______________________________________ Date __________  Please bring this form and your medication list with you to all your follow-up doctor's appointments.

## 2015-09-23 NOTE — Progress Notes (Signed)
Subjective/Complaints:  No issues overnite, pt not aware of planned d/c in am  ROS:  No CP, SOB, N/V/D  Objective: Vital Signs: Blood pressure 126/57, pulse 58, temperature 98.2 F (36.8 C), temperature source Oral, resp. rate 18, height 5\' 4"  (1.626 m), weight 64.683 kg (142 lb 9.6 oz), SpO2 100 %. No results found. No results found for this or any previous visit (from the past 72 hour(s)).    General: No acute distress Mood and affect are appropriate Heart: Regular rate and rhythm no rubs murmurs or extra sounds Lungs: Clear to auscultation, breathing unlabored, no rales or wheezes Abdomen: Positive bowel sounds, soft nontender to palpation, nondistended Extremities: No clubbing, cyanosis, or edema Skin: No evidence of breakdown, no evidence of rash Neurologic: Cranial nerves II through XII intact, motor strength is 5/5 in RIght and 3- Left deltoid, bicep, tricep, grip, 5/5 bilateral hip flexor, knee extensors, ankle dorsiflexor and plantar flexor Sensory exam normal sensation to light touch and proprioception in bilateral upper and lower extremities Cerebellar exam normal finger to nose to finger as well as heel to shin in bilateral upper and lower extremities Musculoskeletal: Full range of motion in all 4 extremities. No joint swelling   Assessment/Plan: 1. Functional deficits secondary to Traumatic R frontal ICH  Stable for D/C today F/u PCP in 3-4 weeks F/u PM&R 2 weeks See D/C summary See D/C instructions. FIM: Function - Bathing Position: Shower Body parts bathed by patient: Right arm, Left arm, Chest, Abdomen, Front perineal area, Left upper leg, Right upper leg, Right lower leg, Left lower leg, Buttocks Body parts bathed by helper: Back Bathing not applicable: Back Assist Level: Supervision or verbal cues Set up : To obtain items, To adjust water temperature  Function- Upper Body Dressing/Undressing What is the patient wearing?: Bra, Pull over shirt/dress Bra  - Perfomed by patient: Thread/unthread right bra strap, Thread/unthread left bra strap Bra - Perfomed by helper: Hook/unhook bra (pull down sports bra) Pull over shirt/dress - Perfomed by patient: Thread/unthread left sleeve, Thread/unthread right sleeve, Put head through opening, Pull shirt over trunk Pull over shirt/dress - Perfomed by helper: Thread/unthread left sleeve, Pull shirt over trunk Assist Level: Touching or steadying assistance(Pt > 75%), Supervision or verbal cues Function - Lower Body Dressing/Undressing What is the patient wearing?: Pants, Underwear, Socks, Shoes Position:  (sitting in chair in bathroom) Underwear - Performed by patient: Thread/unthread right underwear leg, Thread/unthread left underwear leg, Pull underwear up/down Underwear - Performed by helper: Thread/unthread left underwear leg Pants- Performed by patient: Thread/unthread right pants leg, Pull pants up/down, Thread/unthread left pants leg Pants- Performed by helper: Thread/unthread left pants leg Non-skid slipper socks- Performed by patient: Don/doff right sock, Don/doff left sock Non-skid slipper socks- Performed by helper: Don/doff right sock, Don/doff left sock Socks - Performed by patient: Don/doff left sock, Don/doff right sock Socks - Performed by helper: Don/doff right sock Shoes - Performed by patient: Don/doff right shoe, Don/doff left shoe Shoes - Performed by helper: Don/doff right shoe, Don/doff left shoe Assist for footwear: Setup, Supervision/touching assist Assist for lower body dressing: Set up, Supervision or verbal cues Set up : To obtain clothing/put away  Function - Toileting Toileting steps completed by patient: Adjust clothing prior to toileting, Performs perineal hygiene, Adjust clothing after toileting Toileting Assistive Devices: Grab bar or rail Assist level: Supervision or verbal cues  Function - Toilet Transfers Toilet transfer assistive device: Grab bar Assist level to  toilet: Supervision or verbal cues Assist level from toilet:  Supervision or verbal cues  Function - Chair/bed transfer Chair/bed transfer method: Ambulatory Chair/bed transfer assist level: No Help, no cues, assistive device, takes more than a reasonable amount of time Chair/bed transfer assistive device: Armrests  Function - Locomotion: Wheelchair Will patient use wheelchair at discharge?: No Function - Locomotion: Ambulation Assistive device: No device Max distance: >300 ft Assist level: Supervision or verbal cues Assist level: Supervision or verbal cues Assist level: Supervision or verbal cues Assist level: Supervision or verbal cues Assist level: Touching or steadying assistance (Pt > 75%)  Function - Comprehension Comprehension: Auditory Comprehension assist level: Understands basic 50 - 74% of the time/ requires cueing 25 - 49% of the time  Function - Expression Expression: Verbal Expression assist level: Expresses basic needs/ideas: With no assist  Function - Social Interaction Social Interaction assist level: Interacts appropriately 75 - 89% of the time - Needs redirection for appropriate language or to initiate interaction.  Function - Problem Solving Problem solving assist level: Solves basic problems with no assist  Function - Memory Memory assist level: Recognizes or recalls 75 - 89% of the time/requires cueing 10 - 24% of the time Patient normally able to recall (first 3 days only): Location of own room  Medical Problem List and Plan: 1.  Persistent frontal headaches left neglect and field cut generalized weakness with altered mental status secondary to traumatic ICH- Plan d/c in pm after therapy      2.  DVT Prophylaxis/Anticoagulation: SCDs. Monitor for any signs of DVT 3. Pain Management: Percocet as needed 4. 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 Follow-up outpatient interventional radiology Dr. [name] 5. Neuropsych: This patient is not  capable of making decisions on her own behalf. 6. Skin/Wound Care: Routine skin checks 7. Fluids/Electrolytes/Nutrition: encourage po.  8. History of goblet cell carcinoid status post hemicolectomy. [facility] post d/c.appetite is good 9. Hyperlipidemia. Crestor 10. HTN: monitor and provide prns in accordance with increased physical exertion and pain 11. ABLA: Monitor and transfuse if necessary 12. Thrombocytopenia: Cont to follow platelets 13.  Sundowning still unsafe at noc per RN, Pt states she has 24/7 sup at home per son and husband LOS (Days) Adelino E 09/23/2015, 8:32 AM

## 2015-09-26 ENCOUNTER — Telehealth: Payer: Self-pay | Admitting: Physical Medicine & Rehabilitation

## 2015-09-26 NOTE — Telephone Encounter (Signed)
Spoke with her husband John  1. Are you/is patient experiencing any problems since coming home? Are there any questions regarding any aspect of care? She is doing well 2. Are there any questions regarding medications administration/dosing? Are meds being taken as prescribed? She got all her meds so far without trouble 3. Have there been any falls? No 4. Has Home Health been to the house and/or have they contacted you? If not, have you tried to contact them? Can we help you contact them? Done 5. Are bowels and bladder emptying properly? Are there any unexpected incontinence issues? If applicable, is patient following bowel/bladder programs? No 6. Any fevers, problems with breathing, unexpected pain? No 7. Are there any skin problems or new areas of breakdown? No 8. Has the patient/family member arranged specialty MD follow up (ie cardiology/neurology/renal/surgical/etc)?  Can we help arrange? Husband keeping track of appointments in her calendar 53. Does the patient need any other services or support that we can help arrange? No 10. Are caregivers following through as expected in assisting the patient? Husband at home full time  F/U scheduled on October 07, 2015 at 1:00pm

## 2015-09-28 ENCOUNTER — Other Ambulatory Visit (HOSPITAL_COMMUNITY): Payer: Self-pay | Admitting: Interventional Radiology

## 2015-09-28 ENCOUNTER — Ambulatory Visit (HOSPITAL_COMMUNITY)
Admit: 2015-09-28 | Discharge: 2015-09-28 | Disposition: A | Discharge status: Home / Self Care | Payer: Medicare Other | Attending: Interventional Radiology | Admitting: Interventional Radiology

## 2015-09-28 ENCOUNTER — Ambulatory Visit (HOSPITAL_COMMUNITY)
Admission: RE | Admit: 2015-09-28 | Discharge: 2015-09-28 | Disposition: A | Discharge status: Home / Self Care | Payer: Medicare Other | Source: Ambulatory Visit | Attending: Interventional Radiology | Admitting: Interventional Radiology

## 2015-09-28 DIAGNOSIS — I671 Cerebral aneurysm, nonruptured: Secondary | ICD-10-CM

## 2015-09-28 DIAGNOSIS — I6320 Cerebral infarction due to unspecified occlusion or stenosis of unspecified precerebral arteries: Secondary | ICD-10-CM

## 2015-09-30 ENCOUNTER — Other Ambulatory Visit: Payer: Self-pay

## 2015-09-30 NOTE — Progress Notes (Signed)
Interface, Rad Results In - 08/26/2015 1:02 PM EST  CT ABDOMEN PELVIS W CONTRAST (ROUTINE), 08/26/2015 11:47 AM  INDICATION: NEOPLASM - OTHER ABDOMINAL PRIMARYC18.1 Cancer of appendix [facility])  COMPARISON: 11/03/2014, 12/10/2014  TECHNIQUE: Following the administration of intravenous contrast, axial images of the abdomen and pelvis were obtained in the portal venous phase. Supplemental 2D reformatted images were generated and reviewed.  [facility] and its affiliates are committed to minimizing radiation dose to patients while maintaining necessary diagnostic image quality. All CT scans are therefore performed using "As Low As Reasonably Achievable (ALARA)" protocols with either manual or automated exposure controls calibrated to the age and size of each patient.  Limitations: Images are degraded by respiratory motion.  FINDINGS:  LOWER CHEST: Heart size upper normal. Small pericardial effusion. Small hiatal hernia. Mild bibasilar and middle lobe opacities, favor atelectasis and/or scarring.  ABDOMEN: . Liver: Central cystic density measures 1.9 cm, 1.8 cm on the prior. Too small to further characterize hypodensities (series 2 image 29 on the right and image 30 on the left), similar. . Gallbladder/biliary: Slight intrahepatic ductal prominence. No extrahepatic biliary ductal dilatation. No radiodense gallstones. . Spleen: Within normal limits. . Pancreas: Dilatation of the pancreatic duct to the level of the ampulla, measuring up to 6 mm, similar to prior postoperative scan. . Adrenals: Indeterminate nodular thickening of the lateral limb left adrenal gland, unchanged (series 2 image 49). . Kidneys: Lobular renal contours. Too small to further characterize hypodensities within each kidney. Left extra renal pelvis. No hydronephrosis.. . Peritoneum/mesenteries: Omentectomy. No free intraperitoneal fluid. No overt adenopathy, within limitations of respiratory motion. .  Extraperitoneum: Within normal limits. . Gastrointestinal tract: Interval right hemicolectomy with ileocolonic anastomosis. Colonic diverticulosis. Large duodenal diverticulum. No bowel obstruction. . Vascular: Scattered atherosclerotic disease of the abdominal aorta and branch vessels. Occluded right common iliac artery, reconstituted flow distally via collaterals.  PELVIS: . Ureters: Within normal limits. . Bladder: Within normal limits. . Reproductive System: Absent uterus. No adnexal mass.  MSK: Polyarticular degenerative changes. Grade 1 anterolisthesis of L5 on S1.  CONCLUSION:  Interval omentectomy and right hemicolectomy with ileocolonic anastomosis. No bowel obstruction.  No definite metastatic disease identified within the abdomen and pelvis.   Indeterminate hepatic hypodensities and nodular thickening of the lateral limb left adrenal gland are similar to prior. Recommend continued attention on follow-up.  Other unchanged findings as above.    Progress Notes - in this encounter Vanetta Mulders, MD - 09/02/2015 11:30 AM EST Formatting of this note may be different from the original. SURGICAL ONCOLOGY/ENDOCRINE SURGERY THYROID REFERRAL HISTORY AND PHYSICAL EXAM  Subjective: HISTORY OF PRESENT ILLNESS:  Andrea Keller is a 70 y.o. 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 Crisoforo Oxford for evaluation of thyroid nodules. She is status post right hemicolectomy for a perforated appendiceal carcinoid. A perioperative CT of her chest/abdomen and pelvis revealed bilateral thyroid nodules. She reports a history of right sided neck surgery when she was 70 years old at [place]. After her surgery she also received radiation to the right neck. She specifically denies palpitations, tremors, weight loss, weight gain, heat intolerance, cold intolerance, diarrhea,  constipation, anxiousness, feeling excessive energy, tremulousness, sweating, change in hair, skin, fingernails, depression, voice changes, dysphagia, pressure sensation in the neck, ocular symptoms, menorrhagia.   Current status: The dominant mid/lower right sided nodule as well as  left sided nodule were sampled by FNA on 03/08/16 andboth demonstrated benign follicular nodules. She denies any symptoms except for being cold today only. The right side of her neck is sensitive to palpation.  Past Medical History  Diagnosis Date  . Cancer ([place])  appendicieal  . Hypercholesterolemia  . Hypertension    Past Surgical History  Procedure Laterality Date  . Appendectomy 10/28/2014  . Hysterectomy  . Cyst removal age 13  rt neck  . Colonoscopy  . Right hemicolectomy laparoscopic N/A 12/22/2014  Procedure: RIGHT HEMICOLECTOMY LAPAROSCOPIC OMENTECTOMY; Surgeon: Jyl Heinz, MD; Location: [facility] MAIN OR; Service: General; Laterality: N/A; TAP block  . Intraperitoneal hyperthermic chemotherapy N/A 12/22/2014  Procedure: HYPERTHERMIC INTRAPERITONEAL CHEMOTHERAPY -; Surgeon: Jyl Heinz, MD; Location: [facility] MAIN OR; Service: General; Laterality: N/A;  . Colon surgery 12/22/2014   Family History  Problem Relation Age of Onset  . Diabetes Mother  . Colon cancer Brother  . Kidney failure Brother  . Kidney failure Son  . Colon cancer Paternal Uncle    Social History   Social History  . Marital status: Married  Spouse name: N/A  . Number of children: N/A  . Years of education: N/A   Occupational History  . Not on file.   Social History Main Topics  . Smoking status: Former Smoker  Packs/day: 0.50  Years: 21.00  Types: Cigarettes  Quit date: 11/08/2013  . Smokeless tobacco: Never Used  . Alcohol use No  . Drug use: No  . Sexual activity: Not on file   Other Topics Concern  . Not on file   Social History Narrative    Current Outpatient Prescriptions  Medication Sig Dispense Refill  .  acetaminophen (TYLENOL) 325 MG tablet Take 650 mg by mouth as needed.  Marland Kitchen amLODIPine (NORVASC) 10 MG tablet Take 10 mg by mouth daily.  . ferrous sulfate (FERATAB) 325 (65 FE) MG tablet Take 325 mg by mouth daily.  . rosuvastatin (CRESTOR) 10 MG tablet Take 10 mg by mouth daily.   No current facility-administered medications for this visit.   Allergies  Allergen Reactions  . Latex Rash (ALLERGY/intolerance)    REVIEW OF SYSTEMS A complete ROS was performed with pertinent positives/negatives noted in the HPI. The remainder of the ROS are negative.  OBJECTIVE: Physical Examination Vitals:  09/02/15 1103  BP: 148/68  Pulse: 63  Temp: 97.9 F (36.6 C)  SpO2: 98%  PainSc: 0-Zero  Height: 1.575 m (5\' 2" )  Weight: 63.3 kg (139 lb 8 oz)  BMI (Calculated): 25.6   General: No apparent distress. Normal mood and affect.  Head-Eyes-Ears-Nose-Throat: Pupils equal, round, and reactive to light. MMM. No palpable LAD. Palpable abnormalities to bilateral thyroid lobes, noticeable radiation changes to skin of R neck  Cardiovascular: Regular rate and rhythm.  Pulmonary: Clear to auscultation bilaterally.  Abdomen-Gastrointestinal: Abdomen soft, non-tender, non-distended. Bowel sounds positive.  Extremities: Peripheral pulses intact.  Other: Aside from radiation changes, no concerning skin findings; right side of neck is sensitive to palpation   Imaging: Bedside clinical ultrasound performed by Dr. Celine Ahr in clinic today. See separate documentation.  Labs: Results for GALA, MERRIHEW (MRN [id number]) as of 02/28/2015 08:36 Ref. Range 02/25/2015 10:23  TSH Latest Ref Range: 0.200-4.500 UIU/ML 2.330   Assessment/Plan: Darline Divita is a 70 y.o. female with bilateral thyroid nodules in the setting of childhood neck radiation. Due to higher risk of malignancy in this population, ongoing surveillance was advised.  - bedside ultrasound today showed no significant changes in  bilateral  thyroid nodules - follow up in 1 year with Dr. Celine Ahr.  Electronically signed by: Vanetta Mulders, MD 09/02/2015 11:43 AM   I saw and evaluated the patient with Dr. Beryl Meager. I agree with the above documentation, which I have edited where appropriate. Electronically signed by: Fredirick Maudlin, MD 09/06/2015 1:55 PM  Electronically signed by: Fredirick Maudlin, MD 09/06/15 1355

## 2015-10-03 ENCOUNTER — Encounter: Payer: Self-pay | Admitting: Hematology and Oncology

## 2015-10-03 ENCOUNTER — Ambulatory Visit (HOSPITAL_BASED_OUTPATIENT_CLINIC_OR_DEPARTMENT_OTHER): Payer: Medicare Other | Admitting: Hematology and Oncology

## 2015-10-03 VITALS — BP 129/62 | HR 77 | Temp 97.9°F | Resp 18 | Ht 64.0 in | Wt 150.5 lb

## 2015-10-03 DIAGNOSIS — I611 Nontraumatic intracerebral hemorrhage in hemisphere, cortical: Secondary | ICD-10-CM

## 2015-10-03 DIAGNOSIS — I619 Nontraumatic intracerebral hemorrhage, unspecified: Secondary | ICD-10-CM

## 2015-10-03 DIAGNOSIS — C7931 Secondary malignant neoplasm of brain: Secondary | ICD-10-CM

## 2015-10-03 DIAGNOSIS — C7A020 Malignant carcinoid tumor of the appendix: Secondary | ICD-10-CM | POA: Diagnosis not present

## 2015-10-03 DIAGNOSIS — D509 Iron deficiency anemia, unspecified: Secondary | ICD-10-CM | POA: Diagnosis not present

## 2015-10-03 MED ORDER — DEXAMETHASONE 2 MG PO TABS: 2.0000 mg | ORAL_TABLET | Freq: Two times a day (BID) | ORAL | Status: DC

## 2015-10-03 NOTE — Progress Notes (Signed)
[facility] CONSULT NOTE  Patient Care Team: Haywood Pao, MD as PCP - General (Internal Medicine)  CHIEF COMPLAINTS/PURPOSE OF CONSULTATION:  Intracranial hemorrhage versus intracranial metastases  HISTORY OF PRESENTING ILLNESS:  Andrea Keller 70 y.o. female is here because of recent diagnosis of intracranial hemorrhage. Patient was admitted to the hospital with change in behavior and personality. She was also having facial droop as well as difficulty with speech. CT of the head followed by brain MRI revealed a large intracranial hemorrhage. She was seen by neurology and neurosurgery and conservative management was recommended. She was placed on oral dexamethasone. Her symptoms have improved dramatically. She was also noted to have a aneurysm of the brain and is scheduled to see interventional radiology for ablation procedure. Because of a concern that the intracranial hemorrhage could be metastatic disease, we're consulted.  I reviewed her records extensively and collaborated the history with the patient.  MEDICAL HISTORY:  Past Medical History  Diagnosis Date  . Diverticulosis   . Hyperplastic colon polyp   . Insomnia   . Microcytosis   . Osteoarthritis   . PVD (peripheral vascular disease) ([place])   . Multiple thyroid nodules   . Hypertension   . 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 Cyst removal neck    . Abdominal hysterectomy  2009  . Laparoscopic appendectomy N/A 10/28/2014    Procedure: APPENDECTOMY LAPAROSCOPIC;  Surgeon: Erroll Luna, MD;  Location: [facility];  Service: General;  Laterality: N/A;  . Appendectomy  10/28/2014    SOCIAL HISTORY: Social History   Social History  . Marital Status: Married    Spouse Name: N/A  . Number of Children: 2  . Years of Education: N/A   Occupational History  . Retired    Social History Main Topics  . Smoking status: Former Research scientist (life sciences)   . Smokeless tobacco: Never Used     Comment: quit smoking years ago   . Alcohol Use: No  . Drug Use: No  . Sexual Activity: Not on file   Other Topics Concern  . Not on file   Social History Narrative    FAMILY HISTORY: Family History  Problem Relation Age of Onset  . Colon cancer Brother   . Diabetes Mother   . Lung cancer Father   . Alcohol abuse      ALLERGIES:  is allergic to latex.  MEDICATIONS:  Current Outpatient Prescriptions  Medication Sig Dispense Refill  . acetaminophen (TYLENOL) 325 MG tablet Take 650 mg by mouth daily as needed for moderate pain.     Marland Kitchen amLODipine (NORVASC) 10 MG tablet Take 0.5 tablets (5 mg total) by mouth daily. 30 tablet 0  . dexamethasone (DECADRON) 2 MG tablet Take 1 tablet (2 mg total) by mouth 2 (two) times daily with a meal. 90 tablet 0  . dexamethasone (DECADRON) 6 MG tablet Take 1 tablet (6 mg total) by mouth every 12 (twelve) hours. 60 tablet 1  . ferrous sulfate 325 (65 FE) MG tablet Take 1 tablet (325 mg total) by mouth daily with breakfast. 30 tablet 3  . oxyCODONE-acetaminophen (PERCOCET/ROXICET) 5-325 MG tablet Take 1-2 tablets by mouth every 4 (four) hours as needed for moderate pain. 60 tablet 0  . pantoprazole (PROTONIX) 40 MG tablet Take 1 tablet (40 mg total) by mouth daily at 12 noon. 30 tablet 0  . polyethylene glycol (MIRALAX / GLYCOLAX) packet Take 17 g  by mouth daily. 14 each 0  . rosuvastatin (CRESTOR) 10 MG tablet Take 1 tablet (10 mg total) by mouth daily. 30 tablet 1   No current facility-administered medications for this visit.    REVIEW OF SYSTEMS:   Constitutional: Denies fevers, chills or abnormal night sweats Eyes: Denies blurriness of vision, double vision or watery eyes Ears, nose, mouth, throat, and face: Denies mucositis or sore throat Respiratory: Denies cough, dyspnea or wheezes Cardiovascular: Denies palpitation, chest discomfort. 3+lower extremity swelling Gastrointestinal:  Denies nausea,  heartburn or change in bowel habits Skin: Denies abnormal skin rashes Lymphatics: Denies new lymphadenopathy or easy bruising Neurological:Denies numbness, tingling or new weaknesses Behavioral/Psych: Mood is stable, no new changes  All other systems were reviewed with the patient and are negative.  PHYSICAL EXAMINATION: ECOG PERFORMANCE STATUS: 2 - Symptomatic, <50% confined to bed  Filed Vitals:   10/03/15 1524  BP: 129/62  Pulse: 77  Temp: 97.9 F (36.6 C)  Resp: 18   Filed Weights   10/03/15 1524  Weight: 150 lb 8 oz (68.266 kg)    GENERAL:alert, no distress and comfortable SKIN: skin color, texture, turgor are normal, no rashes or significant lesions EYES: normal, conjunctiva are pink and non-injected, sclera clear OROPHARYNX:no exudate, no erythema and lips, buccal mucosa, and tongue normal  NECK: supple, thyroid normal size, non-tender, without nodularity LYMPH:  no palpable lymphadenopathy in the cervical, axillary or inguinal LUNGS: clear to auscultation and percussion with normal breathing effort HEART: regular rate & rhythm and no murmurs and 3+ lower extremity edema ABDOMEN:abdomen soft, non-tender and normal bowel sounds Musculoskeletal:no cyanosis of digits and no clubbing  PSYCH: alert & oriented x 3 with fluent speech NEURO: no focal deficits  LABORATORY DATA:  I have reviewed the data as listed Lab Results  Component Value Date   WBC 7.8 09/12/2015   HGB 10.7* 09/12/2015   HCT 34.1* 09/12/2015   MCV 73.7* 09/12/2015   PLT 142* 09/12/2015   Lab Results  Component Value Date   NA 140 09/11/2015   K 4.5 09/11/2015   CL 106 09/11/2015   CO2 24 09/11/2015   ASSESSMENT AND PLAN:  ICH (intracerebral hemorrhage) (HCC) Right frontal intracerebral hemorrhage diagnoses 1 MRI brain 09/10/2015 presented as right facial droop and neglect of the left eye  Measuring 5.3 x 3.3 cm with local mass effect of the right frontal lobe;  Multiple areas of subacute  infarcts cerebellum, occipital lobe, the right lobe, left frontal lobe, cerebellar infarcts , SAH, 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  Intracerebral hemorrhage due to fall vs 2.  Hemorrhagic metastases  Patient has a scheduled for a brain MRI to be repeated on 10/14/2015. Based on the scan, we may consider discussing with neurology and neurosurgery regarding plan of care.  Brain aneurysm: patient is scheduled to undergo aneurysm ablation procedure Dexamethasone taper: patient is currently on 6 mg by mouth twice a day. I instructed her to take 3 mg by mouth twice a day for 2 weeks and 2 mg twice a day 2 weeks then 2 mg once a day for 2 weeks 1 mg once a day for 2 weeks and 1 mg every other day for 2 weeks and then stop. I gave her a prescription for dexamethasone 2 mg tablets.  I will not make any follow-up appointments at this time. If there are concerning findings and we will consider arranging follow-ups.  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  Microcytic anemia: On oral iron therapy.  All questions were answered. The patient knows to call the clinic with any problems, questions or concerns.    Rulon Eisenmenger, MD 10/03/2015

## 2015-10-03 NOTE — Progress Notes (Signed)
Unable to get in to exam room prior to MD.  No assessment performed.  

## 2015-10-03 NOTE — Assessment & Plan Note (Addendum)
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  Intracerebral hemorrhage due to fall vs 2.  Hemorrhagic metastases  Patient has a scheduled for a brain MRI to be repeated on 10/14/2015. Based on the scan, we may consider discussing with neurology and neurosurgery regarding plan of care.  Brain aneurysm: patient is scheduled to undergo aneurysm ablation procedure Dexamethasone taper: patient is currently on 6 mg by mouth twice a day. I instructed her to take 3 mg by mouth twice a day for 2 weeks and 2 mg twice a day 2 weeks then 2 mg once a day for 2 weeks 1 mg once a day for 2 weeks and 1 mg every other day for 2 weeks and then stop. I gave her a prescription for dexamethasone 2 mg tablets.  I will not make any follow-up appointments at this time. If there are concerning findings and we will consider arranging follow-ups.

## 2015-10-05 ENCOUNTER — Ambulatory Visit: Payer: Medicare Other | Admitting: Hematology and Oncology

## 2015-10-07 ENCOUNTER — Ambulatory Visit (HOSPITAL_BASED_OUTPATIENT_CLINIC_OR_DEPARTMENT_OTHER): Payer: Medicare Other | Admitting: Physical Medicine & Rehabilitation

## 2015-10-07 ENCOUNTER — Encounter: Payer: Self-pay | Admitting: Physical Medicine & Rehabilitation

## 2015-10-07 ENCOUNTER — Encounter: Payer: Medicare Other | Attending: Physical Medicine & Rehabilitation

## 2015-10-07 VITALS — BP 150/71 | HR 90

## 2015-10-07 DIAGNOSIS — C181 Malignant neoplasm of appendix: Secondary | ICD-10-CM | POA: Insufficient documentation

## 2015-10-07 DIAGNOSIS — I671 Cerebral aneurysm, nonruptured: Secondary | ICD-10-CM

## 2015-10-07 DIAGNOSIS — I10 Essential (primary) hypertension: Secondary | ICD-10-CM | POA: Diagnosis not present

## 2015-10-07 DIAGNOSIS — E041 Nontoxic single thyroid nodule: Secondary | ICD-10-CM | POA: Diagnosis not present

## 2015-10-07 DIAGNOSIS — I739 Peripheral vascular disease, unspecified: Secondary | ICD-10-CM | POA: Diagnosis not present

## 2015-10-07 DIAGNOSIS — K635 Polyp of colon: Secondary | ICD-10-CM | POA: Insufficient documentation

## 2015-10-07 DIAGNOSIS — I110 Hypertensive heart disease with heart failure: Secondary | ICD-10-CM | POA: Diagnosis not present

## 2015-10-07 DIAGNOSIS — S06340D Traumatic hemorrhage of right cerebrum without loss of consciousness, subsequent encounter: Secondary | ICD-10-CM | POA: Diagnosis not present

## 2015-10-07 DIAGNOSIS — K5790 Diverticulosis of intestine, part unspecified, without perforation or abscess without bleeding: Secondary | ICD-10-CM | POA: Insufficient documentation

## 2015-10-07 DIAGNOSIS — G4700 Insomnia, unspecified: Secondary | ICD-10-CM | POA: Diagnosis not present

## 2015-10-07 DIAGNOSIS — I611 Nontraumatic intracerebral hemorrhage in hemisphere, cortical: Secondary | ICD-10-CM | POA: Insufficient documentation

## 2015-10-07 DIAGNOSIS — I69319 Unspecified symptoms and signs involving cognitive functions following cerebral infarction: Secondary | ICD-10-CM | POA: Diagnosis present

## 2015-10-07 DIAGNOSIS — D62 Acute posthemorrhagic anemia: Secondary | ICD-10-CM | POA: Diagnosis not present

## 2015-10-07 NOTE — Progress Notes (Signed)
Subjective:    Patient ID: Andrea Keller, female    DOB: 1946/01/24, 70 y.o.   MRN: [id number] 70 year old right-handed female history of hypertension, goblet cell carcinoid, status post hemicolectomy May, 2016 followed at [facility].  Lives with spouse.  Presented September 10, 2015, with persistent frontal headaches, generalized weakness, as well as difficulty in concentrating.  She did have a recent fall September 09, 2015 which she hit her head on a dresser. CT of the head showed a 2.5 cm hemorrhage within the right frontal lobe DATE OF ADMISSION:  09/14/2015 DATE OF DISCHARGE:  09/23/2015  HPI PCP Dr Osborne Casco , hasn't seen yet  Has seen Onc to eval for met, hx of appendiceal CA Patient has had no falls at home She is independent with her dressing bathing grooming hygiene Ambulates without assisted device Home health PT OT speech and nursing have been coming out twice a week Pain Inventory Average Pain 0 Pain Right Now 0 My pain is no pain  In the last 24 hours, has pain interfered with the following? General activity 0 Relation with others 0 Enjoyment of life 0 What TIME of day is your pain at its worst? no pain Sleep (in general) no pain  Pain is worse with: no pain Pain improves with: no pain Relief from Meds: no pain  Mobility walk with assistance use a walker ability to climb steps?  yes do you drive?  no  Function retired I need assistance with the following:  dressing, meal prep and shopping  Neuro/Psych bladder control problems trouble walking confusion  Prior Studies Any changes since last visit?  no  Physicians involved in your care Any changes since last visit?  no   Family History  Problem Relation Age of Onset  . Colon cancer Brother   . Diabetes Mother   . Lung cancer Father   . Alcohol abuse     Social History   Social History  . Marital Status: Married    Spouse Name: N/A  . Number of Children: 2  . Years of Education: N/A    Occupational History  . Retired    Social History Main Topics  . Smoking status: Former Research scientist (life sciences)  . Smokeless tobacco: Never Used     Comment: quit smoking years ago   . Alcohol Use: No  . Drug Use: No  . Sexual Activity: Not Asked   Other Topics Concern  . None   Social History Narrative   Past Surgical History  Procedure Laterality Date  . Cyst removal neck    . Abdominal hysterectomy  2009  . Laparoscopic appendectomy N/A 10/28/2014    Procedure: APPENDECTOMY LAPAROSCOPIC;  Surgeon: Erroll Luna, MD;  Location: [facility];  Service: General;  Laterality: N/A;  . Appendectomy  10/28/2014   Past Medical History  Diagnosis Date  . Diverticulosis   . Hyperplastic colon polyp   . Insomnia   . Microcytosis   . Osteoarthritis   . PVD (peripheral vascular disease) ([place])   . Multiple thyroid nodules   . Hypertension   . 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  All other systems reviewed and are negative.      Objective:   Physical Exam  Constitutional: She is oriented to person, place, and time. She appears well-developed and well-nourished.  HENT:  Head: Normocephalic and atraumatic.  Eyes: Conjunctivae and EOM are normal. Pupils are equal, round, and reactive to light.  Cardiovascular: Normal rate and regular rhythm.   No murmur heard. Pulmonary/Chest: Effort normal and breath sounds normal. No respiratory distress. She has no wheezes. She has no rales.  Abdominal: Soft.  Bowel sounds are normal. She exhibits no distension. There is no tenderness.  Neurological: She is alert and oriented to person, place, and time. She displays no atrophy. No sensory deficit. Gait abnormal.  Motor strength is 4+ bilateral deltoid, biceps, triceps, grip, hip flexor, knee extensors, ankle dorsi flexor plantar flexor Date is without assisted device. There is no toe drag or knee instability. She has a slow cadence with widened base of support and small step length.  No evidence of dysmetria on finger-nose-finger testing Patient is slow with finger to thumb opposition bilaterally  Psychiatric: She has a normal mood and affect.  Nursing note and vitals reviewed.         Assessment & Plan:  Medical Problem List and Plan: 1.  Persistent  generalized weakness with altered mental status secondary to traumatic vs metastaic ICH-Patient will follow up with neurology, neuro radiology and oncology We'll finish out home health PTOT speech, will not need outpatient therapy. No physical medicine follow-up needed  2. 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 Follow-up outpatient interventional radiology Dr. [name], repeat MRI 3. Neuropsych: This patient is capable of making decisions on her own behalf.Do not recommend driving at the current time. Would recommend follow-up with neurology to further assess  4. History of goblet cell carcinoid status post hemicolectomy. Follow-up [facility] has seen oncology to evaluate for potential metastasis. Repeat MRI is pending 5. Hyperlipidemia. Crestor, Follow-up PCP 6. PQS:[id number] PCP continue current meds until that time

## 2015-10-07 NOTE — Patient Instructions (Addendum)
Please make an appointment with Dr. Osborne Casco He will refill your medications when you need them Finish home health PT OT speech and nursing, I don't think you will  need outpatient therapy after that  Get okay for her driving from neurologist

## 2015-10-12 ENCOUNTER — Ambulatory Visit
Admission: RE | Admit: 2015-10-12 | Discharge: 2015-10-12 | Disposition: A | Discharge status: Home / Self Care | Payer: Medicare Other | Source: Ambulatory Visit | Attending: Neurology | Admitting: Neurology

## 2015-10-12 DIAGNOSIS — I619 Nontraumatic intracerebral hemorrhage, unspecified: Secondary | ICD-10-CM

## 2015-10-12 MED ORDER — GADOBENATE DIMEGLUMINE 529 MG/ML IV SOLN
13.0000 mL | Freq: Once | INTRAVENOUS | Status: AC | PRN
  Administered 2015-10-12: 13 mL via INTRAVENOUS

## 2015-10-13 ENCOUNTER — Other Ambulatory Visit: Payer: Self-pay | Admitting: Radiology

## 2015-10-14 ENCOUNTER — Encounter (HOSPITAL_COMMUNITY): Payer: Self-pay

## 2015-10-14 ENCOUNTER — Other Ambulatory Visit (HOSPITAL_COMMUNITY): Payer: Self-pay | Admitting: Interventional Radiology

## 2015-10-14 ENCOUNTER — Ambulatory Visit (HOSPITAL_COMMUNITY)
Admission: RE | Admit: 2015-10-14 | Discharge: 2015-10-14 | Disposition: A | Discharge status: Home / Self Care | Payer: Medicare Other | Source: Ambulatory Visit | Attending: Interventional Radiology | Admitting: Interventional Radiology

## 2015-10-14 DIAGNOSIS — M1990 Unspecified osteoarthritis, unspecified site: Secondary | ICD-10-CM | POA: Insufficient documentation

## 2015-10-14 DIAGNOSIS — C181 Malignant neoplasm of appendix: Secondary | ICD-10-CM | POA: Diagnosis not present

## 2015-10-14 DIAGNOSIS — R51 Headache: Secondary | ICD-10-CM | POA: Insufficient documentation

## 2015-10-14 DIAGNOSIS — Z90710 Acquired absence of both cervix and uterus: Secondary | ICD-10-CM | POA: Diagnosis not present

## 2015-10-14 DIAGNOSIS — I671 Cerebral aneurysm, nonruptured: Secondary | ICD-10-CM

## 2015-10-14 DIAGNOSIS — I739 Peripheral vascular disease, unspecified: Secondary | ICD-10-CM | POA: Insufficient documentation

## 2015-10-14 DIAGNOSIS — I10 Essential (primary) hypertension: Secondary | ICD-10-CM | POA: Insufficient documentation

## 2015-10-14 DIAGNOSIS — G4700 Insomnia, unspecified: Secondary | ICD-10-CM | POA: Diagnosis not present

## 2015-10-14 DIAGNOSIS — Z85038 Personal history of other malignant neoplasm of large intestine: Secondary | ICD-10-CM | POA: Insufficient documentation

## 2015-10-14 DIAGNOSIS — R4182 Altered mental status, unspecified: Secondary | ICD-10-CM | POA: Insufficient documentation

## 2015-10-14 DIAGNOSIS — I609 Nontraumatic subarachnoid hemorrhage, unspecified: Secondary | ICD-10-CM | POA: Insufficient documentation

## 2015-10-14 DIAGNOSIS — Z79899 Other long term (current) drug therapy: Secondary | ICD-10-CM | POA: Insufficient documentation

## 2015-10-14 DIAGNOSIS — I6320 Cerebral infarction due to unspecified occlusion or stenosis of unspecified precerebral arteries: Secondary | ICD-10-CM

## 2015-10-14 LAB — CBC
HCT: 35.8 % — ABNORMAL LOW (ref 36.0–46.0)
Hemoglobin: 11.6 g/dL — ABNORMAL LOW (ref 12.0–15.0)
MCH: 24.1 pg — ABNORMAL LOW (ref 26.0–34.0)
MCHC: 32.4 g/dL (ref 30.0–36.0)
MCV: 74.4 fL — AB (ref 78.0–100.0)
PLATELETS: 106 10*3/uL — AB (ref 150–400)
RBC: 4.81 MIL/uL (ref 3.87–5.11)
RDW: 18.1 % — AB (ref 11.5–15.5)
WBC: 6.4 10*3/uL (ref 4.0–10.5)

## 2015-10-14 LAB — PROTIME-INR
INR: 1.08 (ref 0.00–1.49)
Prothrombin Time: 14.2 seconds (ref 11.6–15.2)

## 2015-10-14 LAB — BASIC METABOLIC PANEL
Anion gap: 11 (ref 5–15)
BUN: 21 mg/dL — AB (ref 6–20)
CHLORIDE: 103 mmol/L (ref 101–111)
CO2: 25 mmol/L (ref 22–32)
CREATININE: 0.69 mg/dL (ref 0.44–1.00)
Calcium: 8.3 mg/dL — ABNORMAL LOW (ref 8.9–10.3)
GFR calc Af Amer: >60 mL/min (ref >60)
GLUCOSE: 75 mg/dL (ref 65–99)
Potassium: 4.4 mmol/L (ref 3.5–5.1)
SODIUM: 139 mmol/L (ref 135–145)

## 2015-10-14 LAB — APTT: APTT: 20 s — AB (ref 24–37)

## 2015-10-14 MED ORDER — SODIUM CHLORIDE 0.9 % IV SOLN: Freq: Once | INTRAVENOUS | Status: DC

## 2015-10-14 MED ORDER — IOPAMIDOL (ISOVUE-300) INJECTION 61%
INTRAVENOUS | Status: AC
  Filled 2015-10-14: qty 150

## 2015-10-14 MED ORDER — MIDAZOLAM HCL 2 MG/2ML IJ SOLN
INTRAMUSCULAR | Status: AC | PRN
  Administered 2015-10-14: 1 mg via INTRAVENOUS

## 2015-10-14 MED ORDER — HEPARIN SODIUM (PORCINE) 1000 UNIT/ML IJ SOLN
INTRAMUSCULAR | Status: AC | PRN
  Administered 2015-10-14: 1000 [IU] via INTRAVENOUS

## 2015-10-14 MED ORDER — MIDAZOLAM HCL 2 MG/2ML IJ SOLN
INTRAMUSCULAR | Status: AC
  Filled 2015-10-14: qty 2

## 2015-10-14 MED ORDER — IOPAMIDOL (ISOVUE-300) INJECTION 61%
INTRAVENOUS | Status: AC
  Administered 2015-10-14: 120 mL
  Filled 2015-10-14: qty 100

## 2015-10-14 MED ORDER — HEPARIN SODIUM (PORCINE) 1000 UNIT/ML IJ SOLN
INTRAMUSCULAR | Status: AC
  Filled 2015-10-14: qty 1

## 2015-10-14 MED ORDER — FENTANYL CITRATE (PF) 100 MCG/2ML IJ SOLN
INTRAMUSCULAR | Status: AC
  Filled 2015-10-14: qty 2

## 2015-10-14 MED ORDER — FENTANYL CITRATE (PF) 100 MCG/2ML IJ SOLN
INTRAMUSCULAR | Status: AC | PRN
  Administered 2015-10-14 (×2): 25 ug via INTRAVENOUS

## 2015-10-14 MED ORDER — LIDOCAINE HCL 1 % IJ SOLN
INTRAMUSCULAR | Status: AC
  Filled 2015-10-14: qty 20

## 2015-10-14 MED ORDER — SODIUM CHLORIDE 0.9 % IV SOLN: INTRAVENOUS | Status: AC

## 2015-10-14 NOTE — Sedation Documentation (Signed)
Patient is resting comfortably. Vitals stable. 

## 2015-10-14 NOTE — Discharge Instructions (Signed)
Angiogram, Care After °Refer to this sheet in the next few weeks. These instructions provide you with information about caring for yourself after your procedure. Your health care provider may also give you more specific instructions. Your treatment has been planned according to current medical practices, but problems sometimes occur. Call your health care provider if you have any problems or questions after your procedure. °WHAT TO EXPECT AFTER THE PROCEDURE °After your procedure, it is typical to have the following: °· Bruising at the catheter insertion site that usually fades within 1-2 weeks. °· Blood collecting in the tissue (hematoma) that may be painful to the touch. It should usually decrease in size and tenderness within 1-2 weeks. °HOME CARE INSTRUCTIONS °· Take medicines only as directed by your health care provider. °· You may shower 24-48 hours after the procedure or as directed by your health care provider. Remove the bandage (dressing) and gently wash the site with plain soap and water. Pat the area dry with a clean towel. Do not rub the site, because this may cause bleeding. °· Do not take baths, swim, or use a hot tub until your health care provider approves. °· Check your insertion site every day for redness, swelling, or drainage. °· Do not apply powder or lotion to the site. °· Do not lift over 10 lb (4.5 kg) for 5 days after your procedure or as directed by your health care provider. °· Ask your health care provider when it is okay to: °¨ Return to work or school. °¨ Resume usual physical activities or sports. °¨ Resume sexual activity. °· Do not drive home if you are discharged the same day as the procedure. Have someone else drive you. °· You may drive 24 hours after the procedure unless otherwise instructed by your health care provider. °· Do not operate machinery or power tools for 24 hours after the procedure or as directed by your health care provider. °· If your procedure was done as an  outpatient procedure, which means that you went home the same day as your procedure, a responsible adult should be with you for the first 24 hours after you arrive home. °· Keep all follow-up visits as directed by your health care provider. This is important. °SEEK MEDICAL CARE IF: °· You have a fever. °· You have chills. °· You have increased bleeding from the catheter insertion site. Hold pressure on the site.  CALL 911 °SEEK IMMEDIATE MEDICAL CARE IF: °· You have unusual pain at the catheter insertion site. °· You have redness, warmth, or swelling at the catheter insertion site. °· You have drainage (other than a small amount of blood on the dressing) from the catheter insertion site. °· The catheter insertion site is bleeding, and the bleeding does not stop after 30 minutes of holding steady pressure on the site. °· The area near or just beyond the catheter insertion site becomes pale, cool, tingly, or numb. °  °This information is not intended to replace advice given to you by your health care provider. Make sure you discuss any questions you have with your health care provider. °  °Document Released: 01/04/2005 Document Revised: 07/09/2014 Document Reviewed: 11/19/2012 °Elsevier Interactive Patient Education ©2016 [employer] ° °

## 2015-10-14 NOTE — Sedation Documentation (Signed)
Patient is resting comfortably. 

## 2015-10-14 NOTE — Sedation Documentation (Signed)
Vital signs stable. 

## 2015-10-14 NOTE — Sedation Documentation (Signed)
Patient is resting comfortably. No complaints at this time 

## 2015-10-14 NOTE — H&P (Signed)
Chief Complaint: Patient was seen in consultation today for cerebral arteriogram at the request of Dr Rosalin Hawking  Referring Physician(s): Dr Rosalin Hawking  Supervising Physician: Luanne Bras  History of Present Illness: Andrea Keller is a 70 y.o. female   Hx colon ca Frontal headaches and  AMS since 09/05/15 Known head injury---fall at home CT IMPRESSION: 2.5 cm hemorrhage within the right frontal lobe with extensive white matter hypodensity throughout the right cerebral hemisphere. While this could be posttraumatic, given the degree of hypodensity throughout the right cerebral hemisphere, I cannot exclude underlying brain lesions. Recommend further evaluation with MRI with and without contrast. No acute bony abnormality in the cervical spine   .Mr [name] Contrast 09/10/2015  Subacute RIGHT frontal lobe 5.3 x 3.3 cm hemorrhage, with disproportionate marginal enhancement and mass effect, concerning for underlying mass though not distinctly demonstrated. Recommend 10-14 day follow-up MRI of the brain with contrast.  RIGHT frontoparietal subarachnoid hemorrhage. Multiple subcentimeter foci of acute ischemia (less likely nonenhancing metastasis) within the supra and infratentorial brain, predominately in posterior circulation favoring embolic phenomena. 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   Referral to Dr [name] 09/29/15 Now scheduled for cerebral arteriogram    Past Medical History  Diagnosis Date  . Diverticulosis   . Hyperplastic colon polyp   . Insomnia   . Microcytosis   . Osteoarthritis   . PVD (peripheral vascular disease) ([place])   . Multiple thyroid nodules   . Hypertension   . 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 Cyst removal neck    . Abdominal hysterectomy   2009  . Laparoscopic appendectomy N/A 10/28/2014    Procedure: APPENDECTOMY LAPAROSCOPIC;  Surgeon: Erroll Luna, MD;  Location: [facility];  Service: General;  Laterality: N/A;  . Appendectomy  10/28/2014    Allergies: Latex  Medications: Prior to Admission medications   Medication Sig Start Date End Date Taking? Authorizing Provider  acetaminophen (TYLENOL) 325 MG tablet Take 650 mg by mouth daily as needed for moderate pain.    Yes Historical Provider, MD  amLODipine (NORVASC) 10 MG tablet Take 0.5 tablets (5 mg total) by mouth daily. 09/23/15  Yes Daniel J Angiulli, PA-C  dexamethasone (DECADRON) 0.5 MG tablet Take 0.5 mg by mouth 2 (two) times daily with a meal. Starting 11-18-15 ending 12-01-15   Yes Historical Provider, MD  dexamethasone (DECADRON) 2 MG tablet Take 1 tablet (2 mg total) by mouth 2 (two) times daily with a meal. Patient taking differently: Take 2 mg by mouth 2 (two) times daily with a meal. Starting 10-21-15 ending 11-03-15 10/03/15  Yes Nicholas Lose, MD  dexamethasone (DECADRON) 2 MG tablet Take 1 mg by mouth 2 (two) times daily with a meal. Starting 11-04-15 ending 11-17-15   Yes Historical Provider, MD  dexamethasone (DECADRON) 6 MG tablet Take 3 mg by mouth 2 (two) times daily with a meal. For 14 days ending 10-20-15   Yes Historical Provider, MD  ferrous sulfate 325 (65 FE) MG tablet Take 1 tablet (325 mg total) by mouth daily with breakfast. 09/23/15  Yes Daniel J Angiulli, PA-C  pantoprazole (PROTONIX) 40 MG tablet Take 1 tablet (40 mg total) by mouth daily at 12 noon. 09/23/15  Yes Daniel J Angiulli, PA-C  rosuvastatin (CRESTOR) 10 MG tablet Take 1  tablet (10 mg total) by mouth daily. 09/23/15  Yes Lavon Paganini Angiulli, PA-C     Family History  Problem Relation Age of Onset  . Colon cancer Brother   . Diabetes Mother   . Lung cancer Father   . Alcohol abuse      Social History   Social History  . Marital Status: Married    Spouse Name: N/A  . Number of Children:  2  . Years of Education: N/A   Occupational History  . Retired    Social History Main Topics  . Smoking status: Former Research scientist (life sciences)  . Smokeless tobacco: Never Used     Comment: quit smoking years ago   . Alcohol Use: No  . Drug Use: No  . Sexual Activity: Not Asked   Other Topics Concern  . None   Social History Narrative      Review of Systems: A 12 point ROS discussed and pertinent positives are indicated in the HPI above.  All other systems are negative.  Review of Systems  Constitutional: Positive for activity change. Negative for fever, appetite change and fatigue.  HENT: Negative for tinnitus and trouble swallowing.   Eyes: Negative for visual disturbance.  Respiratory: Negative for cough and shortness of breath.   Gastrointestinal: Negative for abdominal pain.  Neurological: Positive for weakness and headaches. Negative for dizziness, tremors, seizures, syncope, facial asymmetry, speech difficulty, light-headedness and numbness.  Psychiatric/Behavioral: Negative for behavioral problems and confusion.       Able to recite name/dob and reason for exam    Vital Signs: BP 151/70 mmHg  Temp(Src) 98.4 F (36.9 C) (Oral)  Resp 15  Ht 5\' 3"  (1.6 m)  Wt 142 lb (64.411 kg)  BMI 25.16 kg/m2  SpO2 100%  Physical Exam  Constitutional: She is oriented to person, place, and time.  HENT:  Head: Atraumatic.  Eyes: EOM are normal.  Cardiovascular: Normal rate, regular rhythm and normal heart sounds.   No murmur heard. Pulmonary/Chest: Effort normal and breath sounds normal. She has no wheezes.  Abdominal: Soft. Bowel sounds are normal. There is no tenderness.  Musculoskeletal: Normal range of motion. She exhibits no edema or tenderness.  Neurological: She is alert and oriented to person, place, and time.  Skin: Skin is warm and dry.  Psychiatric: She has a normal mood and affect. Her behavior is normal. Thought content normal.  Nursing note and vitals  reviewed.   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 EXAM: MRA head (without) TECHNIQUE: MR angiogram of the head was obtained utilizing 3D time of flight sequences from below the vertebrobasilar junction up to the intracranial vasculature without contrast.  Computerized reconstructions were obtained. CONTRAST: no IMAGING SITE: [facility] [address] (1.5 Tesla MRI)  FINDINGS: This study is of adequate technical quality. Flow signal of the bilateral internal carotid arteries have no stenosis. The bilateral middle and anterior cerebral arteries have no stenosis. The right vertebral and basilar arteries have no stenosis. There bilateral posterior cerebral arteries have irregularities consistent with atherosclerosis. The left vertebral artery has decreased signal near the vertebro-basilar junction. Again demonstrated is a focal outpouching in the supraclinoid left  internal carotid artery near the left posterior communicating artery, projecting inferiorly and laterally, which may represent focal aneurysm. It measures 4x7mm. Chronic right frontal intracerebral hemorrhage noted.   10/13/2015  Abnormal MRA head (without) demonstrating: 1. Focal outpouching in the supraclinoid left internal carotid artery (4x37mm) near the junction of the left posterior communicating artery, projecting inferiorly and laterally, which may represent focal aneurysm. 2. There bilateral posterior cerebral arteries have moderate atherosclerosis. 3. The left vertebral artery has decreased signal near the vertebro-basilar junction. 4. Compared to  MRA on 09/12/15, no significant change. 5. See MRI brain on same day as this study for details regarding right frontal chronic intracerebral hemorrhage. INTERPRETING PHYSICIAN: Penni Bombard, MD Certified in Neurology, Neurophysiology and Neuroimaging [facility] [address] [place], [place] [address] [phone]   Mr Kizzie Fantasia Contrast  10/13/2015  [facility] NEUROIMAGING REPORT STUDY DATE: 10/12/15 PATIENT NAME: JAVONDA ESSIG DOB: [birth year] MRN: [id number] ORDERING CLINICIAN: Rosalin Hawking, MD PhD CLINICAL HISTORY: 70 year old female with right frontal intracerebral hemorrhage. EXAM: MRI brain (with and without) TECHNIQUE: MRI of the brain with and without contrast was obtained utilizing 5 mm axial slices with T1, T2, T2 flair, SWI and diffusion weighted views.  T1 sagittal, T2 coronal and postcontrast views in the axial and coronal plane were obtained. CONTRAST: 28ml multihance IMAGING SITE: [facility] [address] (1.5 Tesla MRI)  FINDINGS: Right frontal chronic intracerebral hemorrhage with central cystic cavity (T1 hypointense, T2 hyperintense) and peripheral rim of hemosiderin. T1 hyperintense rim may reflect subacute blood products. It measures 3.9x2.6x2.3cm (AP x trans x SI). Significant reduction in mass effect. No midline shift. Right hemispheric sulcal hemosiderin deposition consistent with chronic subarachnoid hemorrhage. T2 hyperintensity noted within the right occipital dura may reflect subacute blood products. Moderate-severe periventricular and subcortical chronic small vessel ischemic disease. No abnormal lesions are seen on diffusion-weighted views to suggest acute ischemia. The remaining cortical sulci, fissures and cisterns are normal in size and appearance. Lateral, third and fourth ventricle are normal in size and appearance. No extra-axial fluid collections are seen. No evidence of mass effect or midline shift.  No  abnormal lesions are seen on post contrast views.  On sagittal views the posterior fossa, pituitary gland and corpus callosum are unremarkable. The orbits and their contents, paranasal sinuses and calvarium are unremarkable.  Intracranial flow voids are present.   10/13/2015  Abnormal MRI brain (with and without) demonstrating: 1. Right frontal chronic intracerebral hemorrhage with central cystic cavity and peripheral rim of hemosiderin. It measures 3.9x2.6x2.3cm (AP x trans x SI). There is significant reduction in mass effect. There is no midline shift. 2. Right hemispheric sulcal hemosiderin consistent with chronic subarachnoid hemorrhage. 3. Focal T2 hyperintensity noted within the right occipital dura may reflect subacute blood products. 4. Moderate-severe periventricular and subcortical chronic small vessel ischemic disease. 5. Compared to MRI on 09/10/15, there has been expected evolutional change of right frontal ICH with decreased edema, mass effect and midline shift. No evidence of underlying mass or vascular malformation. Right occipital dural T1 hyperintense signal is new, but of unclear significance. INTERPRETING PHYSICIAN: Penni Bombard, MD Certified in Neurology, Neurophysiology and Neuroimaging [facility] [address] [place], [place] [address] [phone]   Ir Radiologist Eval & Mgmt  09/29/2015  EXAM: NEW PATIENT OFFICE VISIT CHIEF COMPLAINT: Intracranial hemorrhage.  Newly discovered intracranial aneurysm. Current Pain Level: 1-10 HISTORY OF PRESENT ILLNESS: The patient is a 70 year old,  right-handed lady who has been referred for evaluation and management of a right frontal intracranial hemorrhage, and also the discovery of an intracranial aneurysm. Patient is accompanied by her husband, son and daughter. She was recently admitted with subtle changes in her memory, concentration and headaches. 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 Also during workup MRI, there were scattered left cerebellar diffusion weighted restriction abnormalities. During her stay in the hospital the patient was started on IV Decadron and then subsequently discharged on dexamethasone. Since her discharge, the patient reports significant improvement on a daily basis at the present time demonstrating no headaches, visual problems, speech difficulties, motor weakness, gait abnormalities, coordination difficulties. She denies any double vision or loss of vision. She continues to have an occasional word-finding problem. According to her she has improved significantly and her memory difficulties are significantly also mitigated. Past Medical History: Recent hemicolectomy for colon cancer. Peripheral vascular disease, hypertension, hyperlipidemia. Medications: Tylenol. Amlodipine. Dexamethasone 6 mg q. 12 hours. Ferrous sulfate. Oxycodone 5/325, 1-2 tablets p.r.n. for pain. Protonix for reflux. Polyethylene. Rosuvastatin. Allergies: Latex, diffuse itching and rash. Social History: Patient married, lives with family as above. Son and daughter alive and well. Denies presently smoking or drinking alcohol or using illicit chemicals. Previous history of smoking however, stopped many years ago. Family History: Negative for intracranial aneurysm, strokes, or hemorrhages. History of cancer of the pancreas, diabetes, and lung cancer and colon and pancreatic cancer in her brothers. REVIEW OF SYSTEMS: Essentially negative for pathologic symptomatology. Patient and the family reports patient has improved appetite and has gained weight. Patient feels hungry all the time. Denies recent chills or fever or rigors. Denies any UTI symptoms of dysuria, frequency of micturition, hematuria. Denies any recent cough or wheezing. Denies any chest pain, shortness of breath or palpitations or  ankle edema. PHYSICAL EXAMINATION: In no acute distress. Affect normal. Neurologically alert, awake, oriented to time, place, space. Minimal left nasal labial fold flattening. Otherwise no lateralizing motor, sensory or coordination difficulties. ASSESSMENT AND PLAN: Patient's recent MRI and MRA of the brain was reviewed with her and her family. This demonstrates the right frontal hemorrhage with mild mass-effect in the peripheral aspect of the lesion. Also demonstrated is what appears to be a wide-based lobulated outpouching in the left internal carotid artery intracranially and the left periophthalmic/PCOM region. Also noted were 3-4 focal areas of restricted diffusion in the left cerebellar hemisphere. In regards to the suspected aneurysm, patient was counseled on unruptured aneurysms. The first line of action would be definitively diagnosing by catheter angiogram. The procedure, the risks, benefits and the alternatives were reviewed. Questions were answered. Risk of stroke due to the catheter angiogram was also discussed and ways to reduce that. The patient and the family would like proceed with a diagnostic cerebral arteriogram. This will be scheduled at the earliest possible The patient has also been advised to keep her appointment with her neurologist in April to further pursue the right frontal hemorrhage. She also has been scheduled to see her oncologist in regards to her known history of recently treated colon cancer. They were asked to call should they have any concerns or questions. A diagnostic cerebral arteriogram will be schedule in the next few days. Electronically Signed   By: Luanne Bras M.D.   On: 09/28/2015 14:46    Labs:  CBC:  Recent Labs  11/07/14 0502 11/08/14 0511 09/10/15 2022 09/12/15 0706  WBC 13.0* 13.2* 6.0 7.8  HGB 10.1* 9.6* 10.7* 10.7*  HCT 32.0* 30.4* 33.1* 34.1*  PLT 74* 118* 154 142*    COAGS:  Recent Labs  11/04/14 0420 11/05/14 1224 09/11/15 0028   INR 1.16 1.17 1.06  APTT  --  36 30    BMP:  Recent Labs  11/03/14 1521 11/04/14 0420 09/10/15 2022 09/11/15 0827  NA 140 138 140 140  K 3.6 3.6 4.8 4.5  CL 101 103 103 106  CO2 27 24 26 24   GLUCOSE 125* 99 113* 127*  BUN 9 9 15 11   CALCIUM 8.4* 8.2* 9.4 9.3  CREATININE 1.04* 1.07* 1.03* 0.93  GFRNONAA 53* 51* 54* >60  GFRAA >60 60* >60 >60    LIVER FUNCTION TESTS:  Recent Labs  10/29/14 0407 09/10/15 2022  BILITOT 0.6 0.3  AST 12 21  ALT 9 15  ALKPHOS 61 75  PROT 6.8 7.0  ALBUMIN 2.8* 3.5    TUMOR MARKERS: No results for input(s): AFPTM, CEA, CA199, CHROMGRNA in the last 8760 hours.  Assessment and Plan:  ICH/SAH Post fall 09/05/15 MRI/MRA reveals L PCOM aneurysm Now scheduled for cerebral arteriogram Risks and Benefits discussed with the patient including, but not limited to bleeding, infection, vascular injury, contrast induced renal failure, stroke or even death. All of the patient's questions were answered, patient is agreeable to proceed. Consent signed and in chart.  Hx Colon Cancer  Thank you for this interesting consult.  I greatly enjoyed meeting Charlissa P Clinesmith and look forward to participating in their care.  A copy of this report was sent to the requesting provider on this date.  Electronically Signed: Jaydeen Darley A 10/14/2015, 7:32 AM   I spent a total of  30 Minutes   in face to face in clinical consultation, greater than 50% of which was counseling/coordinating care for cerebral arteriogram

## 2015-10-14 NOTE — Sedation Documentation (Signed)
Patient is resting comfortably. No complaints at this time. Vital signs stable.

## 2015-10-14 NOTE — Sedation Documentation (Signed)
Vital signs stable. Pt resting 

## 2015-10-14 NOTE — Procedures (Addendum)
S/P 4  Vessel cerebral arteriogram. RT CFA approach. Findings. 1. Moderate sized area of hypoperfused anterior  rt  frontal region . No associated  tumor blush  Or AV shunting noted. 2.No aneurysm or AVM or DAVF noted. 3.Venous outflow WNLs. 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

## 2015-10-21 ENCOUNTER — Ambulatory Visit (INDEPENDENT_AMBULATORY_CARE_PROVIDER_SITE_OTHER): Payer: Medicare Other | Admitting: Neurology

## 2015-10-21 ENCOUNTER — Encounter: Payer: Self-pay | Admitting: Neurology

## 2015-10-21 VITALS — BP 119/76 | HR 75 | Ht 63.0 in | Wt 155.8 lb

## 2015-10-21 DIAGNOSIS — I6340 Cerebral infarction due to embolism of unspecified cerebral artery: Secondary | ICD-10-CM

## 2015-10-21 DIAGNOSIS — I639 Cerebral infarction, unspecified: Secondary | ICD-10-CM | POA: Insufficient documentation

## 2015-10-21 DIAGNOSIS — R002 Palpitations: Secondary | ICD-10-CM | POA: Insufficient documentation

## 2015-10-21 NOTE — Patient Instructions (Addendum)
-   continue ASA 81mg  and crestor for stroke prevention - check BP at home  - follow up with Dr. [name] for aneurysm - follow up with Dr. Lindi Adie for colon cancer - will do 30 day heart monitoring to rule out afib - will do LE venous ultrasound to rule out clot in legs. - Follow up with your primary care physician for stroke risk factor modification. Recommend maintain blood pressure goal <130/80, diabetes with hemoglobin A1c goal below 6.5% and lipids with LDL cholesterol goal below 70 mg/dL.  - continue home PT/OT - regular exercise and healthy diet - follow up in 2 months.

## 2015-10-21 NOTE — Progress Notes (Signed)
STROKE NEUROLOGY FOLLOW UP NOTE  NAME: Andrea Keller DOB: Aug 22, 1945  REASON FOR VISIT: stroke follow up HISTORY FROM: pt and chart  Today we had the pleasure of seeing Andrea Keller in follow-up at our Neurology [facility]. Pt was accompanied by husband.   History Summary Andrea Keller is a 70 y.o. female with history of PVD, hypertension, colon cancer s/p hemicolectomy 12/2014, HLD, and recent fall with head injury admitted on 09/10/15 for HA and confusion. MRI showed subacute right frontal ICH with marginal enhancement concerning for malignancy but also bilateral supra- and infratentorial punctate acute infarcts consistent with cardioembolic source. 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 MRA showed left paraclinoid aneurysm 7x9 . MRA neck unremarkable. EEG normal and TTE unremarkable. LDL 84 and A1C 6.0. Continued crestor. Her condition gradually getting better and discharged to CIR with out pt follow up of MRI and Dr. [name].  Interval History During the interval time, the patient has been doing well.  Patient has followed up with Dr. 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 She will discuss with Dr. [name] later regarding treatment options. Patient also had a repeat MRI/MRA on 10/13/2015 which showed chronic right frontal ICH without underlying mass or AVM. Patient came in wheelchair, however she said that she walks at home without cane or walker. Denies any headache or palpitation. BP today 119/76 in clinic. She started baby aspirin by herself 3 days ago.  REVIEW OF SYSTEMS: Full 14 system review of systems performed and notable only for those listed below and in HPI above, all others are negative:  Constitutional:   Cardiovascular:  Ear/Nose/Throat:   Skin:  Eyes:   Respiratory:   Gastroitestinal:   Genitourinary:  Hematology/Lymphatic:     Endocrine:  Musculoskeletal:   Allergy/Immunology:   Neurological:   Psychiatric:  Sleep:   The following represents the patient's updated allergies and side effects list: Allergies  Allergen Reactions  . Latex Rash    The neurologically relevant items on the patient's problem list were reviewed on today's visit.  Neurologic Examination  A problem focused neurological exam (12 or more points of the single system neurologic examination, vital signs counts as 1 point, cranial nerves count for 8 points) was performed.  Blood pressure 119/76, pulse 75, height 5\' 3"  (1.6 m), weight 155 lb 12.8 oz (70.67 kg).  General - Well nourished, well developed, in no apparent distress.  Ophthalmologic - Fundi not visualized due to eye movement.  Cardiovascular - Regular rate and rhythm.  Mental Status -  Level of arousal and orientation to time, place, and person were intact. Language including expression, naming, repetition, comprehension was assessed and found intact. Fund of Knowledge was assessed and was intact.  Cranial Nerves II - XII - II - Visual field intact OU. III, IV, VI - Extraocular movements intact. V - Facial sensation intact bilaterally. VII - Facial movement intact bilaterally. VIII - Hearing & vestibular intact bilaterally. X - Palate elevates symmetrically. XI - Chin turning & shoulder shrug intact bilaterally. XII - Tongue protrusion intact.  Motor Strength - The patient's strength was normal in all extremities except left hand mild dexterity difficulties and pronator drift was absent.  Bulk was normal and fasciculations were absent.   Motor Tone - Muscle tone was assessed at the neck and appendages and was normal.  Reflexes - The patient's reflexes  were 1+ in all extremities and she had no pathological reflexes.  Sensory - Light touch, temperature/pinprick were assessed and were normal.    Coordination - The patient had mild dysmetria in the left hand.  Tremor  was absent.  Gait and Station - in wheelchair, gait not tested.   Functional score  mRS = 2   0 - No symptoms.   1 - No significant disability. Able to carry out all usual activities, despite some symptoms.   2 - Slight disability. Able to look after own affairs without assistance, but unable to carry out all previous activities.   3 - Moderate disability. Requires some help, but able to walk unassisted.   4 - Moderately severe disability. Unable to attend to own bodily needs without assistance, and unable to walk unassisted.   5 - Severe disability. Requires constant nursing care and attention, bedridden, incontinent.   6 - Dead.   NIH Stroke Scale   Level Of Consciousness 0=Alert; keenly responsive 1=Not alert, but arousable by minor stimulation 2=Not alert, requires repeated stimulation 3=Responds only with reflex movements 0  LOC Questions to Month and Age 42=Answers both questions correctly 1=Answers one question correctly 2=Answers neither question correctly 0  LOC Commands      -Open/Close eyes     -Open/close grip 0=Performs both tasks correctly 1=Performs one task correctly 2=Performs neighter task correctly 0  Best Gaze 0=Normal 1=Partial gaze palsy 2=Forced deviation, or total gaze paresis 0  Visual 0=No visual loss 1=Partial hemianopia 2=Complete hemianopia 3=Bilateral hemianopia (blind including cortical blindness) 0  Facial Palsy 0=Normal symmetrical movement 1=Minor paralysis (asymmetry) 2=Partial paralysis (lower face) 3=Complete paralysis (upper and lower face) 0  Motor  0=No drift, limb holds posture for full 10 seconds 1=Drift, limb holds posture, no drift to bed 2=Some antigravity effort, cannot maintain posture, drifts to bed 3=No effort against gravity, limb falls 4=No movement Right Arm 0     Leg 0    Left Arm 0     Leg 0  Limb Ataxia 0=Absent 1=Present in one limb 2=Present in two limbs 1  Sensory 0=Normal 1=Mild to moderate  sensory loss 2=Severe to total sensory loss 0  Best Language 0=No aphasia, normal 1=Mild to moderate aphasia 2=Mute, global aphasia 3=Mute, global aphasia 0  Dysarthria 0=Normal 1=Mild to moderate 2=Severe, unintelligible or mute/anarthric 0  Extinction/Neglect 0=No abnormality 1=Extinction to bilateral simultaneous stimulation 2=Profound neglect 0  Total   1     Data reviewed: I personally reviewed the images and agree with the radiology interpretations.  Ct Head Wo Contrast 09/10/2015  2.5 cm hemorrhage within the right frontal lobe with extensive white matter hypodensity throughout the right cerebral hemisphere. While this could be posttraumatic, given the degree of hypodensity throughout the right cerebral hemisphere, I cannot exclude underlying brain lesions. Recommend further evaluation with MRI with and without contrast.   Ct Chest W Contrast 09/11/2015  No primary malignancy or metastatic disease identified in the chest.   Ct Cervical Spine Wo Contrast 09/10/2015  No acute bony abnormality in the cervical spine.   Mr [name] Contrast 09/10/2015  Subacute RIGHT frontal lobe 5.3 x 3.3 cm hemorrhage, with disproportionate marginal enhancement and mass effect, concerning for underlying mass though not distinctly demonstrated. Recommend 10-14 day follow-up MRI of the brain with contrast.  RIGHT frontoparietal subarachnoid hemorrhage. Multiple subcentimeter foci of acute ischemia (less likely nonenhancing metastasis) within the supra and infratentorial brain, predominately in posterior circulation favoring embolic phenomena. In addition, subacute  to old RIGHT parietal occipital lobe infarct. 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   MRA of head and neck -  MRA HEAD: Focally ectatic bilateral carotid termini, in part related to posterior communicating artery infundibulum. No emergent large vessel occlusion  or acute vascular process. Mild luminal irregularity of cerebral arteries compatible with atherosclerosis. MRA NECK: Negative.  CT abdomen and pelvis 08/2015 -  Interval omentectomy and right hemicolectomy with ileocolonic anastomosis. No bowel obstruction. No definite metastatic disease identified within the abdomen and pelvis. Indeterminate hepatic hypodensities and nodular thickening of the lateral limb left adrenal gland are similar to prior. Recommend continued attention on follow-up. Other unchanged findings as above.  US thyroid - stable bilateral thyroid nodules.  CT chest 12/2014 Chest wall/thoracic inlet: Conglomeration of several nonenlarged lymph nodes in the right supraclavicular region. Thyroid: There is a 1.8 x 0.9 cm right thyroid nodule. Mediastinum/hila: There is a 9 mm left paraesophageal lymph node on series 2 image 83. Small hiatal hernia. Heart/vessels: Coronary atherosclerosis. Aortic valve calcifications. Aortic and branch vessel atherosclerosis. Small amount of pericardial fluid. Lungs: Right middle lobe segmental atelectasis. Pleura: Small right pleural effusion decreased from prior. There is a 3 mm right lower lobe nodule on series 4 image 67.  2D echo  - Left ventricle: The cavity size was normal. There was mild focal basal hypertrophy of the septum. Systolic function was normal. The estimated ejection fraction was in the range of 55% to 60%. Wall motion was normal; there were no regional wall motion abnormalities. Doppler parameters are consistent with abnormal left ventricular relaxation (grade 1 diastolic dysfunction). - Aortic valve: There was mild regurgitation directed towards the mitral anterior leaflet. - Left atrium: The atrium was mildly dilated. - Pericardium, extracardiac: A small pericardial effusion was identified posterior to the heart. There was no evidence of hemodynamic compromise. Impressions: No cardiac source of emboli was indentified.  EEG  - This is a normal routine EEG of the awake and drowsy states, without activating procedures. A normal study does not rule out the possibility of a seizure disorder in this patient.  Component     Latest Ref Rng 09/11/2015  Cholesterol     0 - 200 mg/dL 148  Triglycerides     <150 mg/dL 44  HDL Cholesterol     >40 mg/dL 55  Total CHOL/HDL Ratio      2.7  VLDL     0 - 40 mg/dL 9  LDL (calc)     0 - 99 mg/dL 84  Hemoglobin A1C     4.8 - 5.6 % 6.0 (H)  Mean Plasma Glucose      126  Vitamin B12     180 - 914 pg/mL 409  Folate     >5.9 ng/mL 16.2    Assessment: As you may recall, she is a 70 y.o. African American female with PMH of PVD, hypertension, colon cancer s/p hemicolectomy 12/2014, HLD, and recent fall with head injury admitted on 09/10/15 for subacute right frontal ICH with marginal enhancement on MRI concerning for malignancy but also bilateral supra- and infratentorial punctate acute infarcts consistent with cardioembolic source. 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 Her ICH can also be hemorrhagic infarct given pattern of cardioembolic stroke. However, patient did admit that her right head hit table drawer the same day prior to ER presentation. MRA showed left paraclinoid aneurysm 7x9 . MRA neck unremarkable. EEG normal and  TTE unremarkable. LDL 84 and A1C 6.0. Continued crestor. Her condition gradually getting better and discharged to CIR. Patient has followed up with Dr. 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 Patient also had a repeat MRI/MRA on 10/13/2015 which showed chronic right frontal ICH without underlying mass or AVM. Denies any headache or palpitation. Patient started aspirin 81 by her own.  Plan:  - continue ASA 81mg  and crestor for stroke prevention - check BP at home  - follow up with Dr. [name] for aneurysm - follow up with  Dr. Lindi Adie for colon cancer - 30 day heart monitoring to rule out afib - LE venous ultrasound to rule out DVT. - Follow up with your primary care physician for stroke risk factor modification. Recommend maintain blood pressure goal <130/80, diabetes with hemoglobin A1c goal below 6.5% and lipids with LDL cholesterol goal below 70 mg/dL.  - continue home PT/OT - regular exercise and healthy diet - follow up in 2 months.   I spent more than 25 minutes of face to face time with the patient. Greater than 50% of time was spent in counseling and coordination of care. We discussed further stroke workup, follow-up with aneurysm treatment options, continue home PT OT.   Orders Placed This Encounter  Procedures  . Cardiac event monitor    Standing Status: Future     Number of Occurrences:      Standing Expiration Date: 10/21/2016    Scheduling Instructions:     Request cardionet setup. Thank you.    Order Specific Question:  Where should this test be performed?    Answer:  [facility]    No orders of the defined types were placed in this encounter.    Patient Instructions  - continue ASA 81mg  and crestor for stroke prevention - check BP at home  - follow up with Dr. [name] for aneurysm - follow up with Dr. Lindi Adie for colon cancer - will do 30 day heart monitoring to rule out afib - will do LE venous ultrasound to rule out clot in legs. - Follow up with your primary care physician for stroke risk factor modification. Recommend maintain blood pressure goal <130/80, diabetes with hemoglobin A1c goal below 6.5% and lipids with LDL cholesterol goal below 70 mg/dL.  - continue home PT/OT - regular exercise and healthy diet - follow up in 2 months.     Rosalin Hawking, MD PhD [facility] [address] [place], [place] [address] [phone]

## 2015-10-25 ENCOUNTER — Other Ambulatory Visit (HOSPITAL_COMMUNITY): Payer: Self-pay | Admitting: Interventional Radiology

## 2015-10-25 DIAGNOSIS — I729 Aneurysm of unspecified site: Secondary | ICD-10-CM

## 2015-11-11 ENCOUNTER — Other Ambulatory Visit: Payer: Self-pay | Admitting: Neurology

## 2015-11-11 ENCOUNTER — Ambulatory Visit (HOSPITAL_COMMUNITY)
Admission: RE | Admit: 2015-11-11 | Discharge: 2015-11-11 | Disposition: A | Discharge status: Home / Self Care | Payer: Medicare Other | Source: Ambulatory Visit | Attending: Interventional Radiology | Admitting: Interventional Radiology

## 2015-11-11 DIAGNOSIS — I729 Aneurysm of unspecified site: Secondary | ICD-10-CM

## 2015-11-11 DIAGNOSIS — R609 Edema, unspecified: Secondary | ICD-10-CM

## 2015-11-14 ENCOUNTER — Ambulatory Visit (HOSPITAL_COMMUNITY)
Admission: RE | Admit: 2015-11-14 | Discharge: 2015-11-14 | Disposition: A | Discharge status: Home / Self Care | Payer: Medicare Other | Source: Ambulatory Visit | Attending: Internal Medicine | Admitting: Internal Medicine

## 2015-11-14 DIAGNOSIS — M7989 Other specified soft tissue disorders: Secondary | ICD-10-CM | POA: Insufficient documentation

## 2015-11-14 DIAGNOSIS — R609 Edema, unspecified: Secondary | ICD-10-CM

## 2015-11-14 DIAGNOSIS — Z8673 Personal history of transient ischemic attack (TIA), and cerebral infarction without residual deficits: Secondary | ICD-10-CM | POA: Insufficient documentation

## 2015-11-14 NOTE — Progress Notes (Signed)
VASCULAR LAB PRELIMINARY  PRELIMINARY  PRELIMINARY  PRELIMINARY  Bilateral lower extremity venous duplex completed.     Bilateral:  No evidence of DVT, superficial thrombosis, or Baker's Cyst.    Janifer Adie, RVT, RDMS 11/14/2015, 10:14 AM

## 2015-11-18 ENCOUNTER — Telehealth: Payer: Self-pay | Admitting: *Deleted

## 2015-11-18 NOTE — Telephone Encounter (Signed)
Attempted to call pt with results of doppler study.  VM not set up on her mobile.

## 2015-11-22 NOTE — Telephone Encounter (Signed)
Rn call patients husband Andrea Keller on dpr form about his wife doppler results and cardiac monitor. Rn stated per Dr.Xu  the lower extremity venous ultrasound test done recently was negative for venous clot in legs. Please continue current treatment plan. Pts husband verbalized understanding. Rn stated we dont do cardiac monitor placement, we do the referrals. Pts husband stated no one gave him a call. Rn gave pts husband the number for cardiac office on [address] to make appt about the cardiac monitor.

## 2015-11-22 NOTE — Telephone Encounter (Signed)
Patient's husband is calling about getting the heart monitor for his wife.  Please call.

## 2015-11-23 ENCOUNTER — Other Ambulatory Visit (HOSPITAL_COMMUNITY): Payer: Self-pay | Admitting: Interventional Radiology

## 2015-11-23 DIAGNOSIS — I671 Cerebral aneurysm, nonruptured: Secondary | ICD-10-CM

## 2015-11-30 ENCOUNTER — Other Ambulatory Visit: Payer: Self-pay | Admitting: Neurology

## 2015-11-30 ENCOUNTER — Ambulatory Visit (INDEPENDENT_AMBULATORY_CARE_PROVIDER_SITE_OTHER): Payer: Medicare Other

## 2015-11-30 DIAGNOSIS — I4891 Unspecified atrial fibrillation: Secondary | ICD-10-CM | POA: Diagnosis not present

## 2015-11-30 DIAGNOSIS — R002 Palpitations: Secondary | ICD-10-CM | POA: Diagnosis not present

## 2015-11-30 DIAGNOSIS — I639 Cerebral infarction, unspecified: Secondary | ICD-10-CM

## 2015-12-16 ENCOUNTER — Encounter: Payer: Self-pay | Admitting: Neurology

## 2015-12-16 ENCOUNTER — Ambulatory Visit (INDEPENDENT_AMBULATORY_CARE_PROVIDER_SITE_OTHER): Payer: Medicare Other | Admitting: Neurology

## 2015-12-16 VITALS — BP 106/74 | HR 84 | Ht 63.0 in | Wt 184.0 lb

## 2015-12-16 DIAGNOSIS — C801 Malignant (primary) neoplasm, unspecified: Secondary | ICD-10-CM | POA: Diagnosis not present

## 2015-12-16 DIAGNOSIS — I6340 Cerebral infarction due to embolism of unspecified cerebral artery: Secondary | ICD-10-CM | POA: Diagnosis not present

## 2015-12-16 DIAGNOSIS — I10 Essential (primary) hypertension: Secondary | ICD-10-CM | POA: Diagnosis not present

## 2015-12-16 DIAGNOSIS — M7989 Other specified soft tissue disorders: Secondary | ICD-10-CM | POA: Diagnosis not present

## 2015-12-16 DIAGNOSIS — C181 Malignant neoplasm of appendix: Secondary | ICD-10-CM

## 2015-12-16 DIAGNOSIS — I619 Nontraumatic intracerebral hemorrhage, unspecified: Secondary | ICD-10-CM | POA: Diagnosis not present

## 2015-12-16 NOTE — Progress Notes (Signed)
STROKE NEUROLOGY FOLLOW UP NOTE  NAME: Andrea Keller DOB: 07/13/1945  REASON FOR VISIT: stroke follow up HISTORY FROM: pt and chart  Today we had the pleasure of seeing Andrea Keller in follow-up at our [facility]rology [facility]. Pt was accompanied by husband.   History Summary Ms. Andrea Keller is a 70 y.o. 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 MRI showed subacute right frontal ICH with marginal enhancement concerning for malignancy but also bilateral supra- and infratentorial punctate acute infarcts consistent with cardioembolic source. 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 MRA showed left paraclinoid aneurysm 7x9 . MRA neck unremarkable. EEG normal and TTE unremarkable. LDL 84 and A1C 6.0. Continued crestor. Her condition gradually getting better and discharged to CIR with out pt follow up of MRI and Dr. [name].  10/21/15 follow up - the patient has been doing well.  Patient has followed up with Dr. 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 She will discuss with Dr. [name] later regarding treatment options. Patient also had a repeat MRI/MRA on 10/13/2015 which showed chronic right frontal ICH without underlying mass or AVM. Patient came in wheelchair, however she said that she walks at home without cane or walker. Denies any headache or palpitation. BP today 119/76 in clinic. She started baby aspirin by herself 3 days ago.  Interval History During the interval time, pt has been doing well from stroke standpoint. However, she still has ankle and foot swelling and as per pt, it is getting worse and pain on touch especially at the top of feet. The swelling is in both ankles and feet. At night with elevation of legs, the swelling  much improved in the morning but getting worse throughout the day. She had LE venous doppler and no DVT seen. She is on norvasc for BP and her BP today 106/74. She has finished off decadron taper which started when she was in hospital in fear of her [place] was due to metastasis malignancy which was later ruled out. Her renal function and ejection fraction were normal in 3/017 and 10/2015.   REVIEW OF SYSTEMS: Full 14 system review of systems performed and notable only for those listed below and in HPI above, all others are negative:  Constitutional:   Cardiovascular:  Ear/Nose/Throat:   Skin:  Eyes:   Respiratory:   Gastroitestinal:   Genitourinary:  Hematology/Lymphatic:   Endocrine:  Musculoskeletal:   Allergy/Immunology:   Neurological:   Psychiatric:  Sleep:   The following represents the patient's updated allergies and side effects list: Allergies  Allergen Reactions  . Latex Rash    The neurologically relevant items on the patient's problem list were reviewed on today's visit.  Neurologic Examination  A problem focused neurological exam (12 or more points of the single system neurologic examination, vital signs counts as 1 point, cranial nerves count for 8 points) was performed.  Blood pressure 106/74, pulse 84, height 5\' 3"  (1.6 m), weight 184 lb (83.462 kg).  General - Well nourished, well developed, in no apparent distress.  Ophthalmologic - Fundi not visualized due to eye movement.  Cardiovascular - Regular rate and rhythm.  LEs - both ankle and feet swelling, tenderness on palpation at dorsal foot.   Mental Status -  Level of arousal  and orientation to time, place, and person were intact. Language including expression, naming, repetition, comprehension was assessed and found intact. Fund of Knowledge was assessed and was intact.  Cranial Nerves II - XII - II - Visual field intact OU. III, IV, VI - Extraocular movements intact. V - Facial sensation intact  bilaterally. VII - Facial movement intact bilaterally. VIII - Hearing & vestibular intact bilaterally. X - Palate elevates symmetrically. XI - Chin turning & shoulder shrug intact bilaterally. XII - Tongue protrusion intact.  Motor Strength - The patient's strength was normal in all extremities except left hand mild dexterity difficulties and pronator drift was absent.  Bulk was normal and fasciculations were absent.   Motor Tone - Muscle tone was assessed at the neck and appendages and was normal.  Reflexes - The patient's reflexes were 1+ in all extremities and she had no pathological reflexes.  Sensory - Light touch, temperature/pinprick were assessed and were normal.    Coordination - The patient had mild dysmetria in the left hand.  Tremor was absent.  Gait and Station - in wheelchair, gait not tested.   Data reviewed: I personally reviewed the images and agree with the radiology interpretations.  Ct Head Wo Contrast 09/10/2015  2.5 cm hemorrhage within the right frontal lobe with extensive white matter hypodensity throughout the right cerebral hemisphere. While this could be posttraumatic, given the degree of hypodensity throughout the right cerebral hemisphere, I cannot exclude underlying brain lesions. Recommend further evaluation with MRI with and without contrast.   Ct Chest W Contrast 09/11/2015  No primary malignancy or metastatic disease identified in the chest.   Ct Cervical Spine Wo Contrast 09/10/2015  No acute bony abnormality in the cervical spine.   Mr [name] Contrast 09/10/2015  Subacute RIGHT frontal lobe 5.3 x 3.3 cm hemorrhage, with disproportionate marginal enhancement and mass effect, concerning for underlying mass though not distinctly demonstrated. Recommend 10-14 day follow-up MRI of the brain with contrast.  RIGHT frontoparietal subarachnoid hemorrhage. Multiple subcentimeter foci of acute ischemia (less likely nonenhancing metastasis)  within the supra and infratentorial brain, predominately in posterior circulation favoring embolic phenomena. 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   MRA of head and neck -  MRA HEAD: Focally ectatic bilateral carotid termini, in part related to posterior communicating artery infundibulum. No emergent large vessel occlusion or acute vascular process. Mild luminal irregularity of cerebral arteries compatible with atherosclerosis. MRA NECK: Negative.  CT abdomen and pelvis 08/2015 -  Interval omentectomy and right hemicolectomy with ileocolonic anastomosis. No bowel obstruction. No definite metastatic disease identified within the abdomen and pelvis. Indeterminate hepatic hypodensities and nodular thickening of the lateral limb left adrenal gland are similar to prior. Recommend continued attention on follow-up. Other unchanged findings as above.  US thyroid - stable bilateral thyroid nodules.  CT chest 12/2014 Chest wall/thoracic inlet: Conglomeration of several nonenlarged lymph nodes in the right supraclavicular region. Thyroid: There is a 1.8 x 0.9 cm right thyroid nodule. Mediastinum/hila: There is a 9 mm left paraesophageal lymph node on series 2 image 83. Small hiatal hernia. Heart/vessels: Coronary atherosclerosis. Aortic valve calcifications. Aortic and branch vessel atherosclerosis. Small amount of pericardial fluid. Lungs: Right middle lobe segmental atelectasis. Pleura: Small right pleural effusion decreased from prior. There is a 3 mm right lower lobe nodule on series 4 image 67.  2D echo  -  Left ventricle: The cavity size was normal. There was mild focal basal hypertrophy of the septum. Systolic function was normal. The estimated ejection fraction was in the range of 55% to 60%. Wall motion was normal; there were no regional wall motion abnormalities.  Doppler parameters are consistent with abnormal left ventricular relaxation (grade 1 diastolic dysfunction). - Aortic valve: There was mild regurgitation directed towards the mitral anterior leaflet. - Left atrium: The atrium was mildly dilated. - Pericardium, extracardiac: A small pericardial effusion was identified posterior to the heart. There was no evidence of hemodynamic compromise. Impressions: No cardiac source of emboli was indentified.  EEG - This is a normal routine EEG of the awake and drowsy states, without activating procedures. A normal study does not rule out the possibility of a seizure disorder in this patient.  LE venous doppler - negative for DVT  30 day cardiac event monitoring - pending  Component     Latest Ref Rng 09/11/2015  Cholesterol     0 - 200 mg/dL 148  Triglycerides     <150 mg/dL 44  HDL Cholesterol     >40 mg/dL 55  Total CHOL/HDL Ratio      2.7  VLDL     0 - 40 mg/dL 9  LDL (calc)     0 - 99 mg/dL 84  Hemoglobin A1C     4.8 - 5.6 % 6.0 (H)  Mean Plasma Glucose      126  Vitamin B12     180 - 914 pg/mL 409  Folate     >5.9 ng/mL 16.2    Assessment: As you may recall, she is a 70 y.o. African American female with PMH of PVD, hypertension, colon cancer s/p hemicolectomy 12/2014, HLD, and recent fall with head injury admitted on 09/10/15 for subacute right frontal ICH with marginal enhancement on MRI but also bilateral supra- and infratentorial punctate acute infarcts consistent with cardioembolic source. 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 Her ICH can also be hemorrhagic infarct given pattern of cardioembolic stroke. However, patient did admit that her right head hit table drawer the same day prior to ER presentation. MRA showed left paraclinoid aneurysm 7x9 . MRA neck unremarkable. EEG normal and TTE unremarkable. LDL 84 and A1C 6.0. Continued crestor. Her condition gradually getting better and discharged to CIR. Patient has  followed up with Dr. 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 Patient also had a repeat MRI/MRA on 10/13/2015 which showed chronic right frontal ICH without underlying mass or AVM. Denies any headache or palpitation. Will follow up with Dr. [name] at the end of June. Patient started aspirin 81 by her own. 30 day cardiac monitoring pending.  Pt also has b/l ankle and feet swelling with tenderness on palpation, can be relieved with elevation of legs. LE venous doppler no DVT. Renal function and EF normal. Will d/c norvasc, leg elevation at rest, consider HCTZ or lasix if BP high, and ask her to make an appointment with PCP.  Plan:  - continue ASA 81mg  and crestor for stroke prevention - stop amlodipine due to potential side effect of ankle/foot swelling - check BP at home and record. If high, may consider HCTZ or lasix for BP and foot swelling. Currently BP at low side - elevate legs while at rest - call Dr. Osborne Casco for appointment  - follow up with Dr. [name]  for aneurysm - continue 30 day heart monitoring to rule out afib - Follow up with your primary care physician for stroke risk factor modification. Recommend maintain blood pressure goal <130/80, diabetes with hemoglobin A1c goal below 6.5% and lipids with LDL cholesterol goal below 70 mg/dL.  - regular exercise and healthy diet - follow up in 4 months.   I spent more than 25 minutes of face to face time with the patient. Greater than 50% of time was spent in counseling and coordination of care. We discussed about ankle/foot swelling management, follow up with PCP and aneurysm treatment options.   No orders of the defined types were placed in this encounter.    Meds ordered this encounter  Medications  . amLODipine (NORVASC) 5 MG tablet    Sig: take 1 tablet by mouth once daily for blood pressure    Refill:  0     Patient Instructions  - continue ASA 81mg  and crestor for stroke prevention - stop amlodipine due to potential side effect of ankle/foot swelling - check BP at home and record. If high, may consider HCTZ or lasix for BP and foot swelling - elevate legs while at rest - call Dr. Osborne Casco for a sooner appointment  - follow up with Dr. [name] for aneurysm - continue 30 day heart monitoring to rule out afib - Follow up with your primary care physician for stroke risk factor modification. Recommend maintain blood pressure goal <130/80, diabetes with hemoglobin A1c goal below 6.5% and lipids with LDL cholesterol goal below 70 mg/dL.  - regular exercise and healthy diet - follow up in 4 months.     Rosalin Hawking, MD PhD [facility] [address] [place], [place] [address] [phone]

## 2015-12-16 NOTE — Patient Instructions (Signed)
-   continue ASA 81mg  and crestor for stroke prevention - stop amlodipine due to potential side effect of ankle/foot swelling - check BP at home and record. If high, may consider HCTZ or lasix for BP and foot swelling - elevate legs while at rest - call Dr. Osborne Casco for a sooner appointment  - follow up with Dr. [name] for aneurysm - continue 30 day heart monitoring to rule out afib - Follow up with your primary care physician for stroke risk factor modification. Recommend maintain blood pressure goal <130/80, diabetes with hemoglobin A1c goal below 6.5% and lipids with LDL cholesterol goal below 70 mg/dL.  - regular exercise and healthy diet - follow up in 4 months.

## 2016-01-05 ENCOUNTER — Other Ambulatory Visit (HOSPITAL_COMMUNITY): Payer: Self-pay | Admitting: Interventional Radiology

## 2016-01-05 DIAGNOSIS — I729 Aneurysm of unspecified site: Secondary | ICD-10-CM

## 2016-01-11 ENCOUNTER — Ambulatory Visit (HOSPITAL_COMMUNITY): Admission: RE | Admit: 2016-01-11 | Payer: Medicare Other | Source: Ambulatory Visit

## 2016-01-13 ENCOUNTER — Ambulatory Visit (HOSPITAL_COMMUNITY)
Admission: RE | Admit: 2016-01-13 | Discharge: 2016-01-13 | Disposition: A | Discharge status: Home / Self Care | Payer: Medicare Other | Source: Ambulatory Visit | Attending: Interventional Radiology | Admitting: Interventional Radiology

## 2016-01-13 DIAGNOSIS — R9089 Other abnormal findings on diagnostic imaging of central nervous system: Secondary | ICD-10-CM | POA: Diagnosis not present

## 2016-01-13 DIAGNOSIS — I729 Aneurysm of unspecified site: Secondary | ICD-10-CM | POA: Diagnosis not present

## 2016-01-13 LAB — POCT I-STAT CREATININE: Creatinine, Ser: 0.9 mg/dL (ref 0.44–1.00)

## 2016-01-13 MED ORDER — GADOBENATE DIMEGLUMINE 529 MG/ML IV SOLN
20.0000 mL | Freq: Once | INTRAVENOUS | Status: AC | PRN
  Administered 2016-01-13: 17 mL via INTRAVENOUS

## 2016-02-09 ENCOUNTER — Other Ambulatory Visit (HOSPITAL_COMMUNITY): Payer: Self-pay | Admitting: Interventional Radiology

## 2016-02-09 DIAGNOSIS — I729 Aneurysm of unspecified site: Secondary | ICD-10-CM

## 2016-02-20 ENCOUNTER — Ambulatory Visit (HOSPITAL_COMMUNITY)
Admission: RE | Admit: 2016-02-20 | Discharge: 2016-02-20 | Disposition: A | Discharge status: Home / Self Care | Payer: Medicare Other | Source: Ambulatory Visit | Attending: Interventional Radiology | Admitting: Interventional Radiology

## 2016-02-20 DIAGNOSIS — I729 Aneurysm of unspecified site: Secondary | ICD-10-CM

## 2016-02-20 HISTORY — PX: IR GENERIC HISTORICAL: IMG1180011

## 2016-02-24 ENCOUNTER — Encounter (HOSPITAL_COMMUNITY): Payer: Self-pay | Admitting: Interventional Radiology

## 2016-04-16 ENCOUNTER — Ambulatory Visit (INDEPENDENT_AMBULATORY_CARE_PROVIDER_SITE_OTHER): Payer: Medicare Other | Admitting: Neurology

## 2016-04-16 ENCOUNTER — Encounter: Payer: Self-pay | Admitting: Neurology

## 2016-04-16 VITALS — BP 110/68 | HR 65 | Ht 63.0 in | Wt 181.0 lb

## 2016-04-16 DIAGNOSIS — I671 Cerebral aneurysm, nonruptured: Secondary | ICD-10-CM

## 2016-04-16 DIAGNOSIS — I10 Essential (primary) hypertension: Secondary | ICD-10-CM | POA: Diagnosis not present

## 2016-04-16 DIAGNOSIS — I6340 Cerebral infarction due to embolism of unspecified cerebral artery: Secondary | ICD-10-CM | POA: Diagnosis not present

## 2016-04-16 DIAGNOSIS — I619 Nontraumatic intracerebral hemorrhage, unspecified: Secondary | ICD-10-CM

## 2016-04-16 MED ORDER — ASPIRIN EC 81 MG PO TBEC: 81.0000 mg | DELAYED_RELEASE_TABLET | Freq: Every day | ORAL | Status: AC

## 2016-04-16 NOTE — Progress Notes (Signed)
STROKE NEUROLOGY FOLLOW UP NOTE  NAME: Andrea Keller DOB: 1946-02-04  REASON FOR VISIT: stroke follow up HISTORY FROM: pt and chart  Today we had the pleasure of seeing Andrea Keller in follow-up at our Neurology [facility]. Pt was accompanied by husband.   History Summary Andrea Keller is a 70 y.o. 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 MRI showed subacute right frontal ICH with marginal enhancement concerning for malignancy but also bilateral supra- and infratentorial punctate acute infarcts consistent with cardioembolic source. 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 MRA showed left paraclinoid aneurysm 7x9 . MRA neck unremarkable. EEG normal and TTE unremarkable. LDL 84 and A1C 6.0. Continued crestor. Her condition gradually getting better and discharged to CIR with out pt follow up of MRI and Dr. [name].  10/21/15 follow up - the patient has been doing well.  Patient has followed up with Dr. 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 She will discuss with Dr. [name] later regarding treatment options. Patient also had a repeat MRI/MRA on 10/13/2015 which showed chronic right frontal ICH without underlying mass or AVM. Patient came in wheelchair, however she said that she walks at home without cane or walker. Denies any headache or palpitation. BP today 119/76 in clinic. She started baby aspirin by herself 3 days ago.  12/16/15 follow up - pt has been doing well from stroke standpoint. However, she still has ankle and foot swelling and as per pt, it is getting worse and pain on touch especially at the top of feet. The swelling is in both ankles and feet. At night with elevation of legs, the swelling much improved in the  morning but getting worse throughout the day. She had LE venous doppler and no DVT seen. She is on norvasc for BP and her BP today 106/74. She has finished off decadron taper which started when she was in hospital in fear of her [place] was due to metastasis malignancy which was later ruled out. Her renal function and ejection fraction were normal in 08/2015 and 10/2015.   Interval History During the interval time, well. Leg swelling resolved after stopping norvasc and put on lasix. Followed with Dr. [name] and repeat MRI in 12/2015 showed no acute infarct and right frontal DWI changes likely due to T2 shine through or hemosiderosis. She will continue to follow up with Dr. [name] for left ICA supraclinoid aneurysm. 30 day cardiac event monitoring showed no afib. She feels good and no confusion. BP today 110/68.  REVIEW OF SYSTEMS: Full 14 system review of systems performed and notable only for those listed below and in HPI above, all others are negative:  Constitutional:   Cardiovascular:  Ear/Nose/Throat:   Skin:  Eyes:   Respiratory:   Gastroitestinal:   Genitourinary:  Hematology/Lymphatic:   Endocrine:  Musculoskeletal:   Allergy/Immunology:   Neurological:   Psychiatric:  Sleep:   The following represents the patient's updated allergies and side effects list: Allergies  Allergen Reactions  . Latex Rash    The neurologically relevant items on the patient's problem list were reviewed on today's visit.  Neurologic Examination  A problem focused neurological exam (12 or more points of the single system neurologic examination, vital signs counts as 1 point, cranial nerves  count for 8 points) was performed.  Blood pressure 110/68, pulse 65, height 5\' 3"  (1.6 m), weight 181 lb (82.1 kg).  General - Well nourished, well developed, in no apparent distress.  Ophthalmologic - Fundi not visualized due to eye movement.  Cardiovascular - Regular rate and rhythm.  Mental Status -    Level of arousal and orientation to time, place, and person were intact. Language including expression, naming, repetition, comprehension was assessed and found intact. Fund of Knowledge was assessed and was intact.  Cranial Nerves II - XII - II - Visual field intact OU. III, IV, VI - Extraocular movements intact. V - Facial sensation intact bilaterally. VII - Facial movement intact bilaterally. VIII - Hearing & vestibular intact bilaterally. X - Palate elevates symmetrically. XI - Chin turning & shoulder shrug intact bilaterally. XII - Tongue protrusion intact.  Motor Strength - The patient's strength was normal in all extremities and pronator drift was absent.  Bulk was normal and fasciculations were absent.   Motor Tone - Muscle tone was assessed at the neck and appendages and was normal.  Reflexes - The patient's reflexes were 1+ in all extremities and she had no pathological reflexes.  Sensory - Light touch, temperature/pinprick were assessed and were normal.    Coordination - coordination intact bilaterally. Tremor was absent.  Gait and Station - in wheelchair, gait not tested.   Data reviewed: I personally reviewed the images and agree with the radiology interpretations.  Ct Head Wo Contrast 09/10/2015  2.5 cm hemorrhage within the right frontal lobe with extensive white matter hypodensity throughout the right cerebral hemisphere. While this could be posttraumatic, given the degree of hypodensity throughout the right cerebral hemisphere, I cannot exclude underlying brain lesions. Recommend further evaluation with MRI with and without contrast.   Ct Chest W Contrast 09/11/2015  No primary malignancy or metastatic disease identified in the chest.   Ct Cervical Spine Wo Contrast 09/10/2015  No acute bony abnormality in the cervical spine.   Mr [name] Contrast 09/10/2015  Subacute RIGHT frontal lobe 5.3 x 3.3 cm hemorrhage, with disproportionate marginal  enhancement and mass effect, concerning for underlying mass though not distinctly demonstrated. Recommend 10-14 day follow-up MRI of the brain with contrast.  RIGHT frontoparietal subarachnoid hemorrhage. Multiple subcentimeter foci of acute ischemia (less likely nonenhancing metastasis) within the supra and infratentorial brain, predominately in posterior circulation favoring embolic phenomena. 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   MRA of head and neck -  MRA HEAD: Focally ectatic bilateral carotid termini, in part related to posterior communicating artery infundibulum. No emergent large vessel occlusion or acute vascular process. Mild luminal irregularity of cerebral arteries compatible with atherosclerosis. MRA NECK: Negative.  CT abdomen and pelvis 08/2015 -  Interval omentectomy and right hemicolectomy with ileocolonic anastomosis. No bowel obstruction. No definite metastatic disease identified within the abdomen and pelvis. Indeterminate hepatic hypodensities and nodular thickening of the lateral limb left adrenal gland are similar to prior. Recommend continued attention on follow-up. Other unchanged findings as above.  US thyroid - stable bilateral thyroid nodules.  CT chest 12/2014 Chest wall/thoracic inlet: Conglomeration of several nonenlarged lymph nodes in the right supraclavicular region. Thyroid: There is a 1.8 x 0.9 cm right thyroid nodule. Mediastinum/hila: There is a 9 mm left paraesophageal lymph node on series 2 image 83. Small hiatal hernia. Heart/vessels: Coronary  atherosclerosis. Aortic valve calcifications. Aortic and branch vessel atherosclerosis. Small amount of pericardial fluid. Lungs: Right middle lobe segmental atelectasis. Pleura: Small right pleural effusion decreased from prior. There is a 3 mm right lower lobe nodule on series 4  image 67.  2D echo  - Left ventricle: The cavity size was normal. There was mild focal basal hypertrophy of the septum. Systolic function was normal. The estimated ejection fraction was in the range of 55% to 60%. Wall motion was normal; there were no regional wall motion abnormalities. Doppler parameters are consistent with abnormal left ventricular relaxation (grade 1 diastolic dysfunction). - Aortic valve: There was mild regurgitation directed towards the mitral anterior leaflet. - Left atrium: The atrium was mildly dilated. - Pericardium, extracardiac: A small pericardial effusion was identified posterior to the heart. There was no evidence of hemodynamic compromise. Impressions: No cardiac source of emboli was indentified.  EEG - This is a normal routine EEG of the awake and drowsy states, without activating procedures. A normal study does not rule out the possibility of a seizure disorder in this patient.  LE venous doppler - negative for DVT  30 day cardiac event monitoring - no afib  MRI with and without contrast 01/13/16 Negative for acute infarct. Small diffusion hyperintensity compatible with T2 shine through similar to the prior study in the right frontal parietal white matter. Extensive chronic ischemic change with chronic hemorrhagic watershed infarct on the right and diffuse chronic microvascular ischemia in the white matter. Chronic hemorrhage right frontal lobe has contracted since the prior study. No new area of hemorrhage.  Component     Latest Ref Rng 09/11/2015  Cholesterol     0 - 200 mg/dL 148  Triglycerides     <150 mg/dL 44  HDL Cholesterol     >40 mg/dL 55  Total CHOL/HDL Ratio      2.7  VLDL     0 - 40 mg/dL 9  LDL (calc)     0 - 99 mg/dL 84  Hemoglobin A1C     4.8 - 5.6 % 6.0 (H)  Mean Plasma Glucose      126  Vitamin B12     180 - 914 pg/mL 409  Folate     >5.9 ng/mL 16.2    Assessment: As you may recall, she is a 70 y.o. African American  female with PMH of PVD, hypertension, colon cancer s/p hemicolectomy 12/2014, HLD, and recent fall with head injury admitted on 09/10/15 for subacute right frontal ICH with marginal enhancement on MRI but also bilateral supra- and infratentorial punctate acute infarcts consistent with cardioembolic source. 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 Her ICH can also be hemorrhagic infarct given pattern of cardioembolic stroke. However, patient did admit that her right head hit table drawer the same day prior to ER presentation. MRA showed left paraclinoid aneurysm 7x9 . MRA neck unremarkable. EEG normal and TTE unremarkable. LDL 84 and A1C 6.0. Continued crestor. Her condition gradually getting better and discharged to CIR. Patient has followed up with Dr. 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 Patient also had a repeat MRI/MRA on 10/13/2015 which showed chronic right frontal ICH without underlying mass or AVM. Denies any headache or palpitation. Patient started aspirin 81 by her own. 30 day cardiac monitoring showed no afib. Repeat MRI with and without contrast in 12/2015 showed no stroke or  tumor. Follow up with Dr. [name] about aneurysm. Has stopped norvasc for BP control due to leg swelling, now on lasix and BP under control.   Plan:  - continue ASA 81mg  and crestor for stroke prevention - check BP at home and record. - follow up with Dr. [name] for aneurysm - Follow up with your primary care physician for stroke risk factor modification. Recommend maintain blood pressure goal <130/80, diabetes with hemoglobin A1c goal below 6.5% and lipids with LDL cholesterol goal below 70 mg/dL.  - regular exercise and healthy diet - No restriction for driving, but recommend to drive with family members on board for 2-3 times initially. If both parties feel comfortable of your  driving, you can drive alone after. However, you are recommended to drive during the day not at night, no long distance and drive in familiar roads. - follow up in 6 months.   I spent more than 25 minutes of face to face time with the patient. Greater than 50% of time was spent in counseling and coordination of care. We discussed about BP management, follow up with Dr. [name] for aneurysm treatment and driving recommendations.   No orders of the defined types were placed in this encounter.   Meds ordered this encounter  Medications  . furosemide (LASIX) 20 MG tablet  . aspirin EC 81 MG tablet    Sig: Take 1 tablet (81 mg total) by mouth daily.    Patient Instructions  - continue ASA 81mg  and crestor for stroke prevention - check BP at home and record. - follow up with Dr. [name] for aneurysm - Follow up with your primary care physician for stroke risk factor modification. Recommend maintain blood pressure goal <130/80, diabetes with hemoglobin A1c goal below 6.5% and lipids with LDL cholesterol goal below 70 mg/dL.  - regular exercise and healthy diet - No restriction for driving, but recommend to drive with family members on board for 2-3 times initially. If both parties feel comfortable of your driving, you can drive alone after. However, you are recommended to drive during the day not at night, no long distance and drive in familiar roads. - follow up in 6 months.    Rosalin Hawking, MD PhD [facility] [address] [place], [place] [address] [phone]

## 2016-04-16 NOTE — Patient Instructions (Addendum)
-   continue ASA 81mg  and crestor for stroke prevention - check BP at home and record. - follow up with Dr. [name] for aneurysm - Follow up with your primary care physician for stroke risk factor modification. Recommend maintain blood pressure goal <130/80, diabetes with hemoglobin A1c goal below 6.5% and lipids with LDL cholesterol goal below 70 mg/dL.  - regular exercise and healthy diet - No restriction for driving, but recommend to drive with family members on board for 2-3 times initially. If both parties feel comfortable of your driving, you can drive alone after. However, you are recommended to drive during the day not at night, no long distance and drive in familiar roads. - follow up in 6 months.

## 2016-04-27 ENCOUNTER — Other Ambulatory Visit (HOSPITAL_COMMUNITY): Payer: Self-pay | Admitting: Interventional Radiology

## 2016-04-27 DIAGNOSIS — I671 Cerebral aneurysm, nonruptured: Secondary | ICD-10-CM

## 2016-05-16 ENCOUNTER — Ambulatory Visit (HOSPITAL_COMMUNITY): Payer: Medicare Other

## 2016-06-08 ENCOUNTER — Ambulatory Visit (HOSPITAL_COMMUNITY): Admission: RE | Admit: 2016-06-08 | Payer: Medicare Other | Source: Ambulatory Visit

## 2016-06-13 ENCOUNTER — Ambulatory Visit (HOSPITAL_COMMUNITY)
Admission: RE | Admit: 2016-06-13 | Discharge: 2016-06-13 | Disposition: A | Discharge status: Home / Self Care | Payer: Medicare Other | Source: Ambulatory Visit | Attending: Interventional Radiology | Admitting: Interventional Radiology

## 2016-06-13 DIAGNOSIS — I671 Cerebral aneurysm, nonruptured: Secondary | ICD-10-CM

## 2016-06-13 HISTORY — PX: IR GENERIC HISTORICAL: IMG1180011

## 2016-06-15 ENCOUNTER — Encounter (HOSPITAL_COMMUNITY): Payer: Self-pay | Admitting: Interventional Radiology

## 2016-06-22 ENCOUNTER — Other Ambulatory Visit: Payer: Self-pay | Admitting: Internal Medicine

## 2016-06-22 DIAGNOSIS — Z1231 Encounter for screening mammogram for malignant neoplasm of breast: Secondary | ICD-10-CM

## 2016-07-16 ENCOUNTER — Ambulatory Visit
Admission: RE | Admit: 2016-07-16 | Discharge: 2016-07-16 | Disposition: A | Discharge status: Home / Self Care | Payer: Medicare Other | Source: Ambulatory Visit | Attending: Internal Medicine | Admitting: Internal Medicine

## 2016-07-16 DIAGNOSIS — Z1231 Encounter for screening mammogram for malignant neoplasm of breast: Secondary | ICD-10-CM

## 2016-10-15 ENCOUNTER — Ambulatory Visit: Payer: Medicare Other | Admitting: Neurology

## 2016-11-19 ENCOUNTER — Telehealth (HOSPITAL_COMMUNITY): Payer: Self-pay | Admitting: Radiology

## 2016-11-19 NOTE — Telephone Encounter (Signed)
Tried to call pt, no answer and no VM. Trying to set up patient's treatment for her brain aneurysm. JM

## 2017-03-07 IMAGING — MR MR MRA HEAD W/O CM
4 of 8 series · 18 of 48 positions shown · IV contrast (multihance)
Comparison: MRI of the brain September 10, 2015

CLINICAL DATA: Acute onset headache, memory difficulties, follow-up
stroke and LEFT para clinoid aneurysm. History of cancer.

EXAM:
MRA NECK WITHOUT AND WITH CONTRAST
MRA HEAD WITHOUT CONTRAST
TECHNIQUE: Multiplanar and multiecho pulse sequences of the neck were obtained
without and with intravenous contrast. Angiographic images of the
neck were obtained using MRA technique without and with intravenous
contrast.; Angiographic images of the Circle of Willis were obtained
using MRA technique without intravenous contrast.
CONTRAST:  10mL MULTIHANCE GADOBENATE DIMEGLUMINE 529 MG/ML IV SOLN

[Series 3: (id) mt fs · axial · 1.4mm · 0.43mm/px · z∈[-48,+59]mm · 8 of 154 slices shown]
[im 1/154]
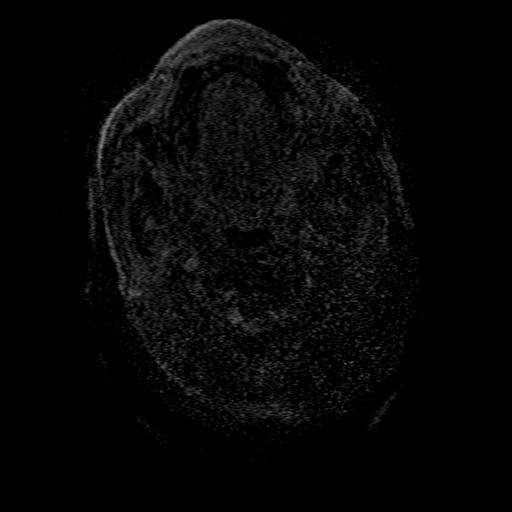
[im 18/154]
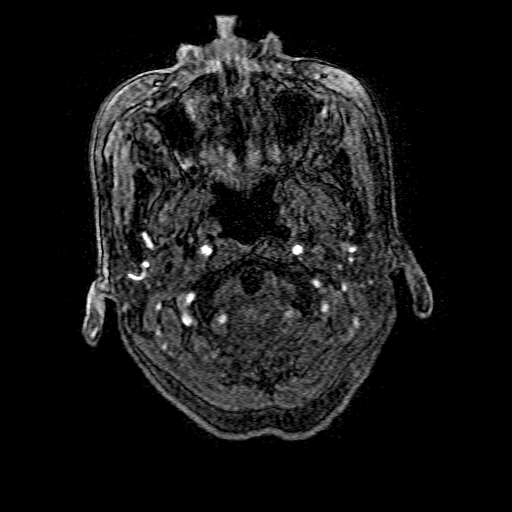
[im 52/154]
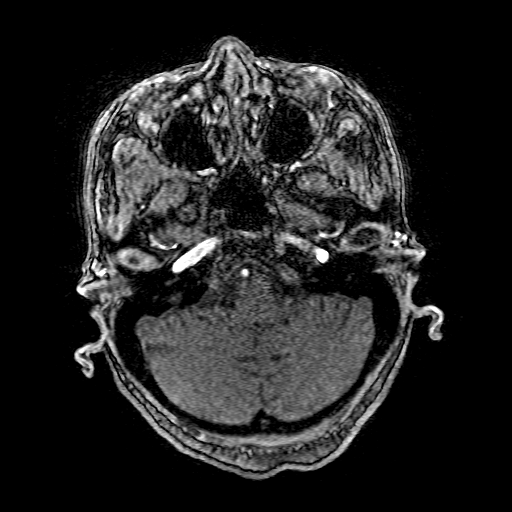
[im 69/154]
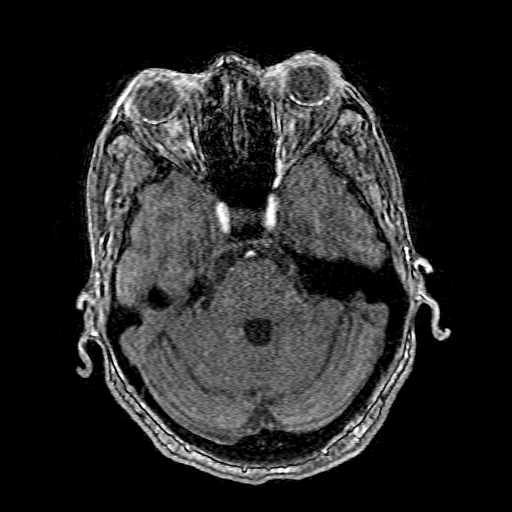
[im 86/154]
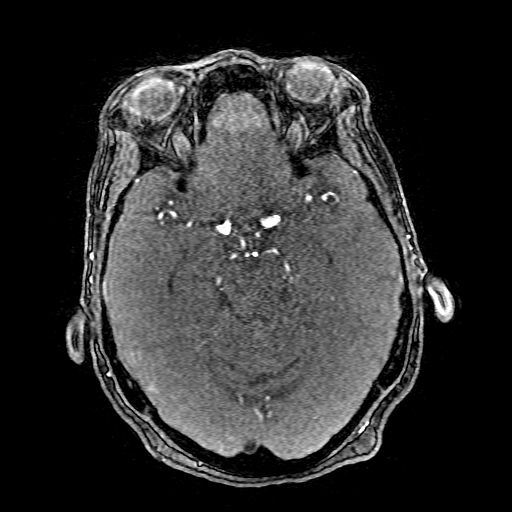
[im 103/154]
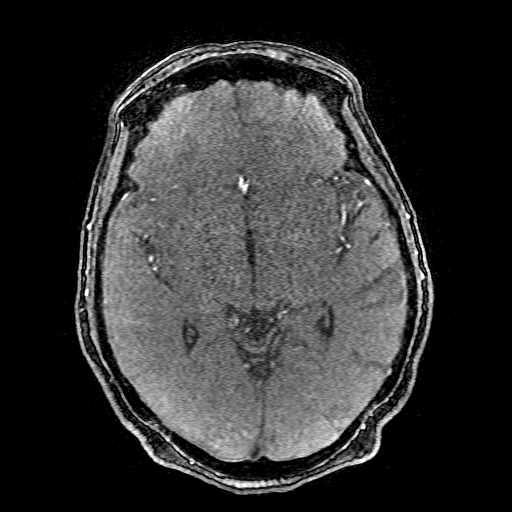
[im 137/154]
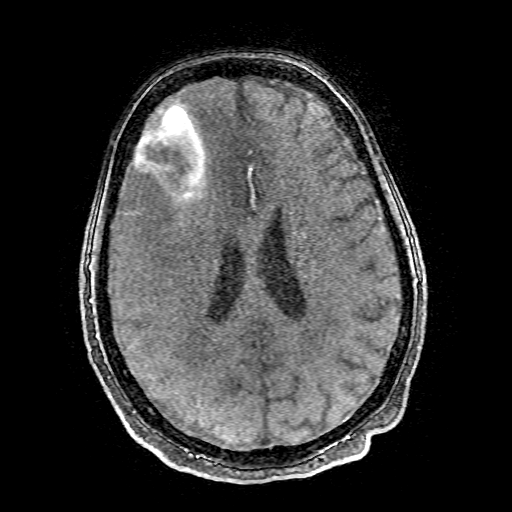
[im 154/154]
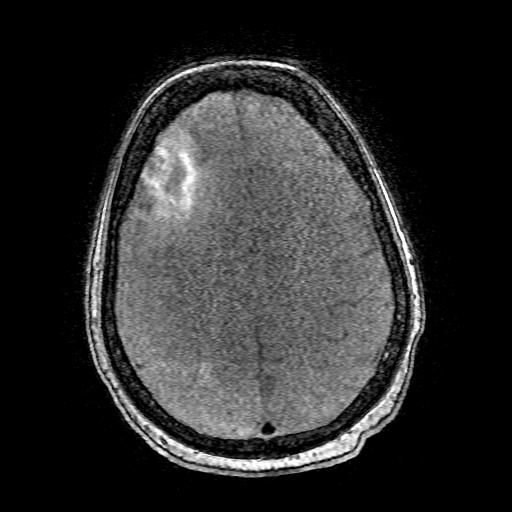

[Series 6: ax (id) · axial · 2.8mm · 0.47mm/px · z∈[-186,-62]mm · 6 of 108 slices shown]
[im 1/108]
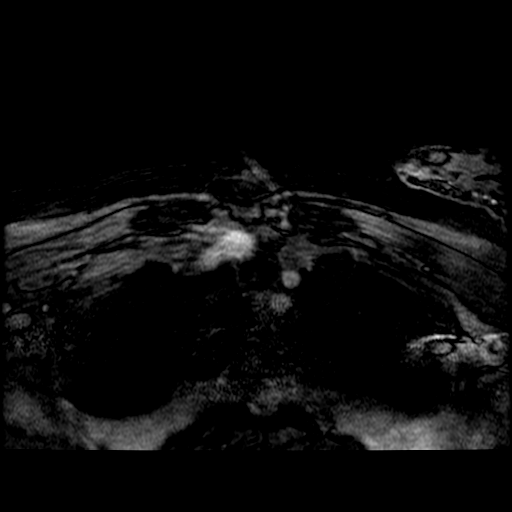
[im 18/108]
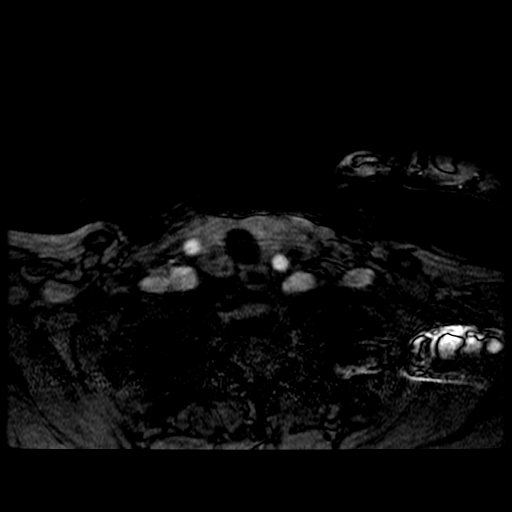
[im 36/108]
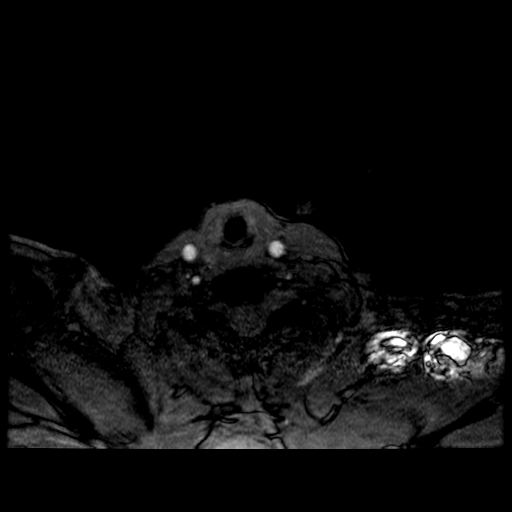
[im 54/108]
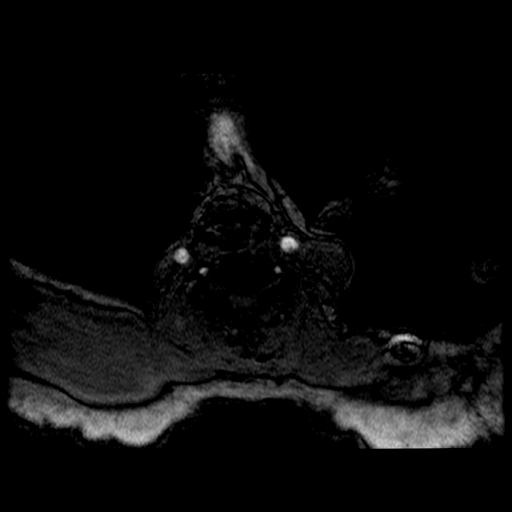
[im 72/108]
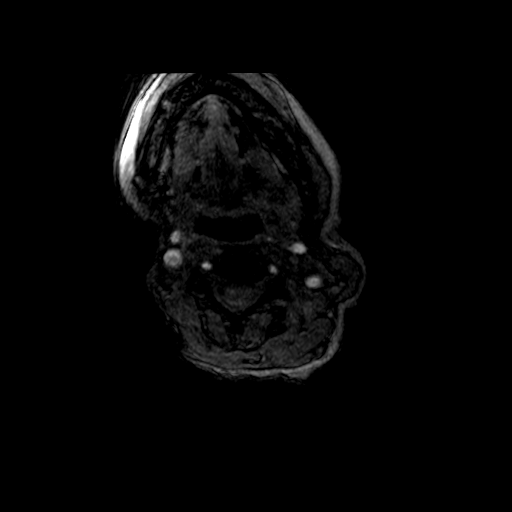
[im 90/108]
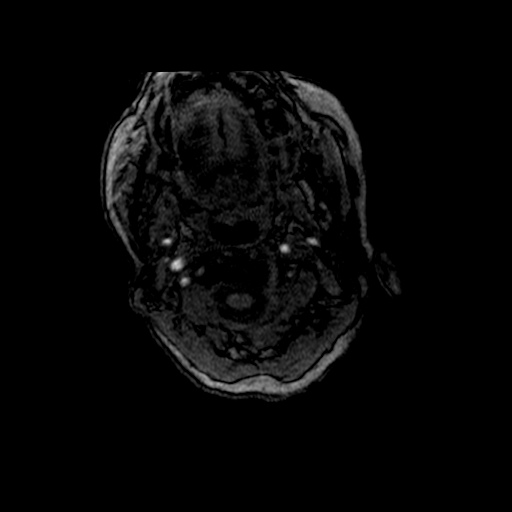

[Series 306: processed images · axial · 1.4mm · 0.27mm/px · 1 of 1 slices shown]
[im 1/1]
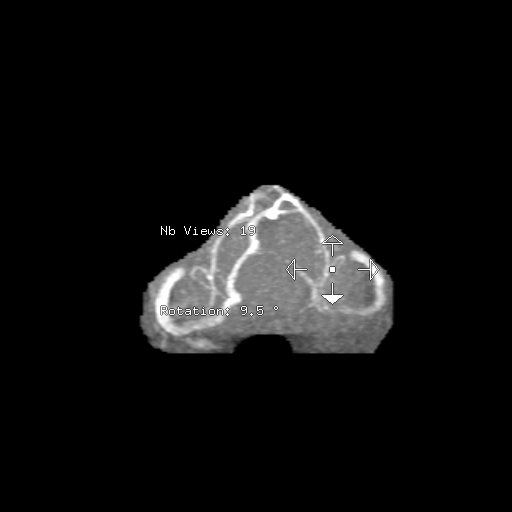

[Series 800: cor cemra ft · coronal · 1.6mm · 0.59mm/px · 3 of 92 slices shown]
[im 19/92]
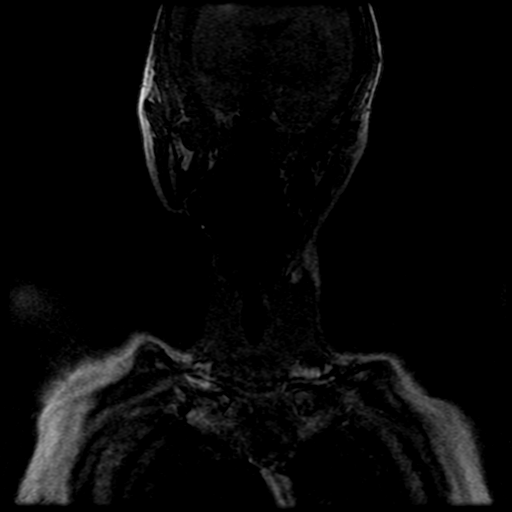
[im 55/92]
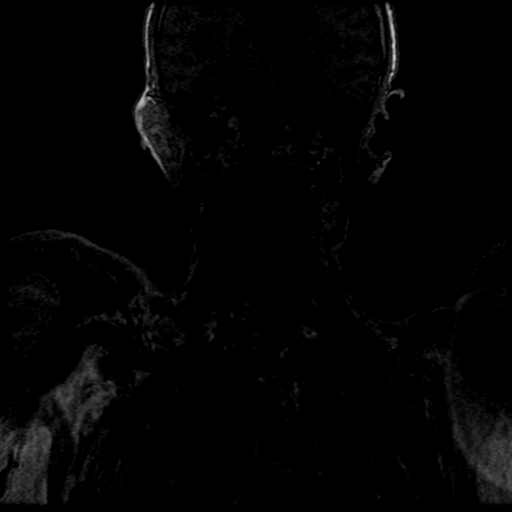
[im 92/92]
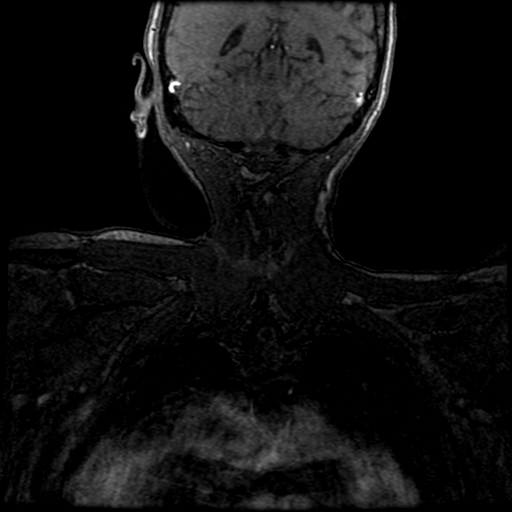

[18 of 48 positions shown; findings below may reference images not displayed]

FINDINGS: MRA HEAD FINDINGS

Mildly motion degraded examination.

Anterior circulation: Flow related enhancement within the cervical,
petrous, cavernous and supraclinoid internal carotid arteries, with
focally ectatic bilateral carotid terminus, at posterior
communicating artery origin. The anterior and middle cerebral
arteries are patent. Mild luminal regularity of the middle cerebral
arteries.

No large vessel occlusion, high-grade stenosis.

Posterior circulation: RIGHT vertebral artery is dominant. Basilar
artery is patent, with normal flow related enhancement of the main
branch vessels. Flow related enhancement of the posterior cerebral
arteries with mild luminal regularity.

No large vessel occlusion, high-grade stenosis, aneurysm.

Evolving RIGHT frontal lobe hematoma with marginal T1 shortening
compatible with subacute blood products. Small amount of subacute
RIGHT cerebrum subarachnoid hemorrhage partially imaged.

MRA NECK FINDINGS

Source images and MIP image were reviewed. The common carotid
arteries are widely patent bilaterally. The carotid bifurcation is
patent bilaterally and there is no significant carotid stenosis.
Both vertebral arteries and patent to the basilar without
significant vertebral stenosis. There is no evidence for
atherosclerosis or flow limiting stenosis.
IMPRESSION: MRA HEAD: Focally ectatic bilateral carotid termini, in part related
to posterior communicating artery infundibulum.

No emergent large vessel occlusion or acute vascular process.

Mild luminal irregularity of cerebral arteries compatible with
atherosclerosis.

MRA NECK:  Negative.

## 2017-08-20 ENCOUNTER — Other Ambulatory Visit: Payer: Self-pay | Admitting: Internal Medicine

## 2017-08-20 DIAGNOSIS — Z139 Encounter for screening, unspecified: Secondary | ICD-10-CM

## 2017-08-23 ENCOUNTER — Ambulatory Visit
Admission: RE | Admit: 2017-08-23 | Discharge: 2017-08-23 | Disposition: A | Discharge status: Home / Self Care | Payer: Medicare Other | Source: Ambulatory Visit | Attending: Internal Medicine | Admitting: Internal Medicine

## 2017-08-23 DIAGNOSIS — Z139 Encounter for screening, unspecified: Secondary | ICD-10-CM

## 2017-08-26 ENCOUNTER — Other Ambulatory Visit: Payer: Self-pay | Admitting: Internal Medicine

## 2017-08-26 DIAGNOSIS — R928 Other abnormal and inconclusive findings on diagnostic imaging of breast: Secondary | ICD-10-CM

## 2017-09-11 ENCOUNTER — Other Ambulatory Visit: Payer: Self-pay | Admitting: Internal Medicine

## 2017-09-11 ENCOUNTER — Ambulatory Visit
Admission: RE | Admit: 2017-09-11 | Discharge: 2017-09-11 | Disposition: A | Discharge status: Home / Self Care | Payer: Medicare Other | Source: Ambulatory Visit | Attending: Internal Medicine | Admitting: Internal Medicine

## 2017-09-11 DIAGNOSIS — Z09 Encounter for follow-up examination after completed treatment for conditions other than malignant neoplasm: Secondary | ICD-10-CM

## 2017-09-11 DIAGNOSIS — R928 Other abnormal and inconclusive findings on diagnostic imaging of breast: Secondary | ICD-10-CM

## 2018-03-14 ENCOUNTER — Other Ambulatory Visit: Payer: Self-pay | Admitting: Internal Medicine

## 2018-03-14 ENCOUNTER — Ambulatory Visit
Admission: RE | Admit: 2018-03-14 | Discharge: 2018-03-14 | Disposition: A | Discharge status: Home / Self Care | Payer: Medicare Other | Source: Ambulatory Visit | Attending: Internal Medicine | Admitting: Internal Medicine

## 2018-03-14 DIAGNOSIS — Z09 Encounter for follow-up examination after completed treatment for conditions other than malignant neoplasm: Secondary | ICD-10-CM

## 2018-03-14 DIAGNOSIS — R921 Mammographic calcification found on diagnostic imaging of breast: Secondary | ICD-10-CM

## 2018-06-08 ENCOUNTER — Other Ambulatory Visit: Payer: Self-pay

## 2018-06-08 ENCOUNTER — Emergency Department (HOSPITAL_COMMUNITY)
Admission: EM | Admit: 2018-06-08 | Discharge: 2018-06-08 | Disposition: A | Discharge status: Home / Self Care | Payer: Medicare Other | Attending: Emergency Medicine | Admitting: Emergency Medicine

## 2018-06-08 ENCOUNTER — Emergency Department (HOSPITAL_COMMUNITY): Payer: Medicare Other

## 2018-06-08 ENCOUNTER — Encounter (HOSPITAL_COMMUNITY): Payer: Self-pay | Admitting: *Deleted

## 2018-06-08 DIAGNOSIS — Z79899 Other long term (current) drug therapy: Secondary | ICD-10-CM | POA: Diagnosis not present

## 2018-06-08 DIAGNOSIS — I10 Essential (primary) hypertension: Secondary | ICD-10-CM | POA: Insufficient documentation

## 2018-06-08 DIAGNOSIS — Z7982 Long term (current) use of aspirin: Secondary | ICD-10-CM | POA: Diagnosis not present

## 2018-06-08 DIAGNOSIS — Z87891 Personal history of nicotine dependence: Secondary | ICD-10-CM | POA: Diagnosis not present

## 2018-06-08 DIAGNOSIS — R55 Syncope and collapse: Secondary | ICD-10-CM | POA: Insufficient documentation

## 2018-06-08 DIAGNOSIS — Z85038 Personal history of other malignant neoplasm of large intestine: Secondary | ICD-10-CM | POA: Diagnosis not present

## 2018-06-08 LAB — CBC WITH DIFFERENTIAL/PLATELET
Abs Immature Granulocytes: 0.06 10*3/uL (ref 0.00–0.07)
BASOS ABS: 0.1 10*3/uL (ref 0.0–0.1)
Basophils Relative: 1 %
EOS ABS: 0.4 10*3/uL (ref 0.0–0.5)
EOS PCT: 4 %
HCT: 40.3 % (ref 36.0–46.0)
Hemoglobin: 11.7 g/dL — ABNORMAL LOW (ref 12.0–15.0)
Immature Granulocytes: 1 %
Lymphocytes Relative: 16 %
Lymphs Abs: 1.6 10*3/uL (ref 0.7–4.0)
MCH: 22.5 pg — AB (ref 26.0–34.0)
MCHC: 29 g/dL — AB (ref 30.0–36.0)
MCV: 77.5 fL — ABNORMAL LOW (ref 80.0–100.0)
Monocytes Absolute: 1.3 10*3/uL — ABNORMAL HIGH (ref 0.1–1.0)
Monocytes Relative: 14 %
NRBC: 0 % (ref 0.0–0.2)
Neutro Abs: 6.1 10*3/uL (ref 1.7–7.7)
Neutrophils Relative %: 64 %
Platelets: 181 10*3/uL (ref 150–400)
RBC: 5.2 MIL/uL — AB (ref 3.87–5.11)
RDW: 15.9 % — AB (ref 11.5–15.5)
WBC: 9.5 10*3/uL (ref 4.0–10.5)

## 2018-06-08 LAB — BASIC METABOLIC PANEL
Anion gap: 12 (ref 5–15)
BUN: 17 mg/dL (ref 8–23)
CALCIUM: 9.2 mg/dL (ref 8.9–10.3)
CO2: 25 mmol/L (ref 22–32)
CREATININE: 1.38 mg/dL — AB (ref 0.44–1.00)
Chloride: 104 mmol/L (ref 98–111)
GFR calc non Af Amer: 38 mL/min — ABNORMAL LOW (ref >60)
GFR, EST AFRICAN AMERICAN: 44 mL/min — AB (ref >60)
Glucose, Bld: 92 mg/dL (ref 70–99)
Potassium: 3.7 mmol/L (ref 3.5–5.1)
SODIUM: 141 mmol/L (ref 135–145)

## 2018-06-08 LAB — I-STAT TROPONIN, ED: TROPONIN I, POC: 0 ng/mL (ref 0.00–0.08)

## 2018-06-08 LAB — TROPONIN I: Troponin I: <0.03 ng/mL (ref <0.03)

## 2018-06-08 MED ORDER — SODIUM CHLORIDE 0.9 % IV BOLUS
500.0000 mL | Freq: Once | INTRAVENOUS | Status: AC
  Administered 2018-06-08: 500 mL via INTRAVENOUS

## 2018-06-08 NOTE — Discharge Instructions (Signed)
Thank you for allowing [place] taking care of you at [facility] emergency room.  You were seen due to dizziness and passing out at home.  Your vital sign were normal, your EKG was unremarkable, your head scan did not show a new stroke or other new abnormality or cause for this spell.  Your kidney number on your blood test was a little bit elevated.  Can be due to dehydration .We gave you some IV fluids.  We are glad that you feel better.  As we discussed, we discharged you and recommend you to eat and drink enough.  Also please schedule an appointment and follow-up with your primary care physician within a week to be evaluated again and PT kidney function tests. Please return to the emergency room if having more dizziness, loss of consciousness, or developing chest pain, loss of breath, numbness or weakness or any new symptoms.  Thanks

## 2018-06-08 NOTE — ED Triage Notes (Signed)
Info per EMS ,Pt fell from a standing position  to floor while in kitchen today. Pt reported feeling weak before bu does not remember fall. Pt reports son caught her and prevented a hard fall.

## 2018-06-08 NOTE — ED Triage Notes (Signed)
MD aware IV team will need to start IV.

## 2018-06-08 NOTE — ED Notes (Signed)
Patient transported to X-ray 

## 2018-06-08 NOTE — ED Provider Notes (Signed)
[facility] EMERGENCY DEPARTMENT Provider Note   CSN: [id number] Arrival date & time: 06/08/18  1219     History   Chief Complaint Chief Complaint  Patient presents with  . Loss of Consciousness    HPI Andrea Keller is a 72 y.o. female.  With past medical history of stroke, HTN, colon cancer, (reports remission), presented with syncopal episode.  She was on her normal level of health yesterday.  This morning when she went to church, felt that she is getting cold.  When came home and while she was washing the dishes, she felt dizzy and hot.  She notified her son and he came to help her.  She felt generalized weakness.  She laid back on the floor, but when the family member put her on sitting position on the chair, she got unresponsive.  It lasted for about 5 minutes.  No head trauma.Per family member, she did not have any shaking movements or seizure-like activity.   No urinary or fecal incontinence are reported.  They called 911.  On EMS arrival, patient gained her consciousness and started to talk.  And was alert and oriented.  She initially had some nausea but it resolved.  She remained asymptomatic since then. she denies any chest pain, shortness of breath, palpitation or fever recently, before or after syncopal episode.  When asking about similar episode, she endorses that she has history of loss of consciousness today that she was diagnosed with stroke around 2016.  HPI  Past Medical History:  Diagnosis Date  . Diverticulosis   . 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 Hyperplastic colon polyp   . Hypertension   . Insomnia   . Microcytosis   . Osteoarthritis   . PVD (peripheral vascular disease) ([place])   . Stroke [facility])     Patient Active Problem List   Diagnosis Date Noted  . Cerebrovascular accident (CVA) due to embolism of cerebral artery ([place]) 12/16/2015  . Foot swelling 12/16/2015  . Palpitations 10/21/2015  . CVA (cerebral vascular accident) ([place])  10/21/2015  . 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 ICH (intracerebral hemorrhage) ([place]) 09/14/2015  . Transient alteration of awareness   . Other vascular headache   . Aneurysm, cerebral, nonruptured   . Benign essential HTN   . Acute blood loss anemia   . Elevated troponin   . Essential hypertension   . HLD (hyperlipidemia)   . Malignant neoplasm of colon ([place])   . Stroke (cerebrum) ([employer]) 09/11/2015  . Intraparenchymal hemorrhage of brain ([place]) 09/10/2015  . Microcytosis   . PVD (peripheral vascular disease) ([place])   . Hypertension   . 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 Thrombocytopenia ([place]) 11/08/2014  . Acute appendicitis 10/28/2014  . Perforated appendicitis 10/28/2014    Past Surgical History:  Procedure Laterality Date  . ABDOMINAL HYSTERECTOMY  2009  . APPENDECTOMY  10/28/2014  . CYST REMOVAL NECK    . IR GENERIC HISTORICAL  02/20/2016   IR RADIOLOGIST EVAL & MGMT 02/20/2016 [facility]  . IR GENERIC HISTORICAL  06/13/2016   IR RADIOLOGIST EVAL & MGMT 06/13/2016 [facility] RAD  . LAPAROSCOPIC APPENDECTOMY N/A 10/28/2014   Procedure: APPENDECTOMY LAPAROSCOPIC;  Surgeon: Erroll Luna, MD;  Location: [facility];  Service: General;  Laterality: N/A;     OB History   None      Home Medications    Prior to Admission medications   Medication Sig Start Date End Date Taking? Authorizing Provider  acetaminophen (TYLENOL)  325 MG tablet Take 650 mg by mouth daily as needed for moderate pain.     [provider]  aspirin EC 81 MG tablet Take 1 tablet (81 mg total) by mouth daily. 04/16/16   Rosalin Hawking, MD  ferrous sulfate 325 (65 FE) MG tablet Take 1 tablet (325 mg total) by mouth daily with breakfast. 09/23/15   Angiulli, Lavon Paganini, PA-C  furosemide (LASIX) 20 MG tablet  04/11/16   [provider]  pantoprazole (PROTONIX) 40 MG tablet Take 1 tablet (40 mg total) by mouth daily at 12 noon. 09/23/15   Angiulli, Lavon Paganini, PA-C  rosuvastatin  (CRESTOR) 10 MG tablet Take 1 tablet (10 mg total) by mouth daily. 09/23/15   Angiulli, Lavon Paganini, PA-C    Family History Family History  Problem Relation Age of Onset  . Diabetes Mother   . Lung cancer Father   . Transient ischemic attack Son   . Colon cancer Brother   . Alcohol abuse Unknown     Social History Social History   Tobacco Use  . Smoking status: Former Research scientist (life sciences)  . Smokeless tobacco: Never Used  . Tobacco comment: quit smoking years ago   Substance Use Topics  . Alcohol use: No  . Drug use: No     Allergies   Latex   Review of Systems Review of Systems   Physical Exam Updated Vital Signs There were no vitals taken for this visit.  Physical Exam  Constitutional: She is oriented to person, place, and time. She appears well-developed and well-nourished. No distress.  HENT:  Head: Normocephalic and atraumatic.  Eyes: EOM are normal.  Cardiovascular: Normal rate, regular rhythm and normal heart sounds.  No murmur heard. Pulmonary/Chest: Effort normal and breath sounds normal. She has no wheezes. She has no rales.  Abdominal: Soft. Bowel sounds are normal. There is no tenderness.  Musculoskeletal: She exhibits no edema.  Neurological: She is alert and oriented to person, place, and time. No cranial nerve deficit or sensory deficit. She exhibits normal muscle tone.  Skin: Skin is warm and dry.  Psychiatric: She has a normal mood and affect. Her behavior is normal.     ED Treatments / Results  Labs (all labs ordered are listed, but only abnormal results are displayed) Labs Reviewed - No data to display  EKG None  Radiology No results found.  Procedures Procedures (including critical care time)  Medications Ordered in ED Medications - No data to display   Initial Impression / Assessment and Plan / ED Course  I have reviewed the triage vital signs and the nursing notes.  Pertinent labs & imaging results that were available during my care of  the patient were reviewed by me and considered in my medical decision making (see chart for details).    Andrea Keller is a 72 y.o. female.  With past medical history of stroke, HTN, colon cancer, (reports remission), presented with dizziness and syncopal episode.   Cardiac, vs neurological, vs vasovagal, vs metabolic.  No chest pain,  No clinical evidence of seizure per Hx. Normal neurologic exam. Having Hx of previous stroke with similar presentation, still consider stroke. Patient is on Lasix. Might be over diuresis/vasovagal. Also eval  -Orthostatic VS--> Negative -EKG-->No ongoing cardiac arrhythmia on EKG. -I stat Trop: <0.03, 0.0 -CBC with diff -BMP -Head Ct scan   Update: Patient has been symptoms free and stable. CBC with diff nl. Head CT scan: No acute finding by CT reported CXR: No  acute cardiopulmonary disease. BMP: AKI with Cr at 1.3 (baseline 0.9). Will give him 500 ml NS Bolus and will discharge.  Results and plan discussed with patient and her family. The verbalized understanding and agrees with plan. Also recommended to f/u with PCP to recheck BMP.   Final Clinical Impressions(s) / ED Diagnoses   Final diagnoses:  None    ED Discharge Orders    None       Dewayne Hatch, MD 06/08/18 1704    Carmin Muskrat, MD 06/10/18 [phone]

## 2018-08-25 ENCOUNTER — Ambulatory Visit
Admission: RE | Admit: 2018-08-25 | Discharge: 2018-08-25 | Disposition: A | Discharge status: Home / Self Care | Payer: Medicare Other | Source: Ambulatory Visit | Attending: Internal Medicine | Admitting: Internal Medicine

## 2018-08-25 DIAGNOSIS — R921 Mammographic calcification found on diagnostic imaging of breast: Secondary | ICD-10-CM

## 2018-08-30 ENCOUNTER — Encounter: Payer: Self-pay | Admitting: Gastroenterology

## 2019-03-05 ENCOUNTER — Other Ambulatory Visit: Payer: Self-pay

## 2019-03-05 ENCOUNTER — Encounter (HOSPITAL_COMMUNITY): Payer: Self-pay

## 2019-03-05 ENCOUNTER — Emergency Department (HOSPITAL_COMMUNITY)
Admission: EM | Admit: 2019-03-05 | Discharge: 2019-03-06 | Disposition: A | Discharge status: Home / Self Care | Payer: Medicare Other | Attending: Emergency Medicine | Admitting: Emergency Medicine

## 2019-03-05 DIAGNOSIS — Z7982 Long term (current) use of aspirin: Secondary | ICD-10-CM | POA: Diagnosis not present

## 2019-03-05 DIAGNOSIS — I10 Essential (primary) hypertension: Secondary | ICD-10-CM | POA: Insufficient documentation

## 2019-03-05 DIAGNOSIS — R110 Nausea: Secondary | ICD-10-CM | POA: Diagnosis not present

## 2019-03-05 DIAGNOSIS — Z91040 Latex allergy status: Secondary | ICD-10-CM | POA: Insufficient documentation

## 2019-03-05 DIAGNOSIS — Z85038 Personal history of other malignant neoplasm of large intestine: Secondary | ICD-10-CM | POA: Insufficient documentation

## 2019-03-05 DIAGNOSIS — Z87891 Personal history of nicotine dependence: Secondary | ICD-10-CM | POA: Insufficient documentation

## 2019-03-05 DIAGNOSIS — Z79899 Other long term (current) drug therapy: Secondary | ICD-10-CM | POA: Insufficient documentation

## 2019-03-05 DIAGNOSIS — R42 Dizziness and giddiness: Secondary | ICD-10-CM | POA: Diagnosis not present

## 2019-03-05 DIAGNOSIS — R55 Syncope and collapse: Secondary | ICD-10-CM | POA: Diagnosis not present

## 2019-03-05 DIAGNOSIS — R2681 Unsteadiness on feet: Secondary | ICD-10-CM | POA: Insufficient documentation

## 2019-03-05 DIAGNOSIS — R531 Weakness: Secondary | ICD-10-CM | POA: Insufficient documentation

## 2019-03-05 NOTE — ED Triage Notes (Signed)
Per EMS, Pt had near syncopal episode while in the hot shower. Pt states this happened last week, but occurred while walking around. No complete LOC, was assisted to the floor, A&O 4x. Pts initial pressure 110/70, last pressure now 140/68. Pt wanted to come in to be checked out.

## 2019-03-05 NOTE — ED Notes (Signed)
Pts states pt shuffles, and weakness in left hand from previous stroke. No change from baseline.

## 2019-03-06 ENCOUNTER — Telehealth: Payer: Self-pay | Admitting: Surgery

## 2019-03-06 DIAGNOSIS — R55 Syncope and collapse: Secondary | ICD-10-CM | POA: Diagnosis not present

## 2019-03-06 LAB — CBC WITH DIFFERENTIAL/PLATELET
Abs Immature Granulocytes: 0.03 10*3/uL (ref 0.00–0.07)
Basophils Absolute: 0.1 10*3/uL (ref 0.0–0.1)
Basophils Relative: 1 %
Eosinophils Absolute: 0.3 10*3/uL (ref 0.0–0.5)
Eosinophils Relative: 4 %
HCT: 35.9 % — ABNORMAL LOW (ref 36.0–46.0)
Hemoglobin: 10.9 g/dL — ABNORMAL LOW (ref 12.0–15.0)
Immature Granulocytes: 0 %
Lymphocytes Relative: 20 %
Lymphs Abs: 1.7 10*3/uL (ref 0.7–4.0)
MCH: 23.6 pg — ABNORMAL LOW (ref 26.0–34.0)
MCHC: 30.4 g/dL (ref 30.0–36.0)
MCV: 77.7 fL — ABNORMAL LOW (ref 80.0–100.0)
Monocytes Absolute: 1 10*3/uL (ref 0.1–1.0)
Monocytes Relative: 11 %
Neutro Abs: 5.5 10*3/uL (ref 1.7–7.7)
Neutrophils Relative %: 64 %
Platelets: 166 10*3/uL (ref 150–400)
RBC: 4.62 MIL/uL (ref 3.87–5.11)
RDW: 16.3 % — ABNORMAL HIGH (ref 11.5–15.5)
WBC: 8.5 10*3/uL (ref 4.0–10.5)
nRBC: 0 % (ref 0.0–0.2)

## 2019-03-06 LAB — BASIC METABOLIC PANEL
Anion gap: 12 (ref 5–15)
BUN: 27 mg/dL — ABNORMAL HIGH (ref 8–23)
CO2: 24 mmol/L (ref 22–32)
Calcium: 8.9 mg/dL (ref 8.9–10.3)
Chloride: 106 mmol/L (ref 98–111)
Creatinine, Ser: 1.21 mg/dL — ABNORMAL HIGH (ref 0.44–1.00)
GFR calc Af Amer: 51 mL/min — ABNORMAL LOW (ref >60)
GFR calc non Af Amer: 44 mL/min — ABNORMAL LOW (ref >60)
Glucose, Bld: 108 mg/dL — ABNORMAL HIGH (ref 70–99)
Potassium: 4 mmol/L (ref 3.5–5.1)
Sodium: 142 mmol/L (ref 135–145)

## 2019-03-06 LAB — URINALYSIS, ROUTINE W REFLEX MICROSCOPIC
Bilirubin Urine: NEGATIVE
Glucose, UA: NEGATIVE mg/dL
Hgb urine dipstick: NEGATIVE
Ketones, ur: NEGATIVE mg/dL
Nitrite: NEGATIVE
Protein, ur: 30 mg/dL — AB
Specific Gravity, Urine: 1.016 (ref 1.005–1.030)
pH: 7 (ref 5.0–8.0)

## 2019-03-06 MED ORDER — SODIUM CHLORIDE 0.9 % IV BOLUS (SEPSIS)
1000.0000 mL | Freq: Once | INTRAVENOUS | Status: AC
  Administered 2019-03-06: 1000 mL via INTRAVENOUS

## 2019-03-06 NOTE — ED Notes (Signed)
Unable to obtain blood from either hand of pt. Pt refuses to be stuck in her arms. Will notify Thedore Mins, RN

## 2019-03-06 NOTE — ED Notes (Signed)
While assisting pt out of bed, pt unsteady from bed to wheel chair. Pt needing x2 assist of staff members to get pt back into bed. Pt used wheelchair to go to [facility]

## 2019-03-06 NOTE — ED Notes (Signed)
Andrea Mins, RN made aware that pt did not want to be stuck in arm(s) for blood draw and writer did not feel comfortable sticking pt in hand(s) due to not being able to find a vein.

## 2019-03-06 NOTE — ED Notes (Signed)
Pt able to ambulate in hallway with a front wheel walker. Pt needing minimal assistance and has steady gait while walking with walker.

## 2019-03-06 NOTE — ED Notes (Signed)
Unable to contact phelbotomy for blood draw, will try again at later time.

## 2019-03-06 NOTE — ED Notes (Signed)
While assisting pt to stand up for standing portion of orthostatic vital signs, pt unsteady when standing. Pt needing x2 assistance to get back into bed after standing. Pt states "I feel fine" while standing.

## 2019-03-06 NOTE — ED Provider Notes (Signed)
[facility] DEPT Provider Note   CSN: [id number] Arrival date & time: 03/05/19  2211     History   Chief Complaint Chief Complaint  Patient presents with  . Loss of Consciousness    HPI Andrea Keller is a 73 y.o. female.     The history is provided by the patient.  Loss of Consciousness Episode history:  Single Progression:  Improving Chronicity:  New Context: standing up   Witnessed: yes   Relieved by:  Certain positions Exacerbated by: standing. Associated symptoms: no chest pain, no fever, no headaches, no shortness of breath and no vomiting    Patient with previous history of stroke, hypertension presents for near syncopal episode.  Patient reports she was in a hot shower when she started feeling nauseous and felt sick.  Her daughter helped her down and she never fell.  She did not have full LOC.  No chest pain or shortness of breath. Patient reports just "feeling bad " but improved No new medicines. Denies recent fever/vomiting/cough/shortness of breath/chest pain. She reports similar episode about a week ago. Past Medical History:  Diagnosis Date  . Diverticulosis   . 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 Hyperplastic colon polyp   . Hypertension   . Insomnia   . Microcytosis   . Osteoarthritis   . PVD (peripheral vascular disease) ([place])   . Stroke [facility])     Patient Active Problem List   Diagnosis Date Noted  . Cerebrovascular accident (CVA) due to embolism of cerebral artery ([place]) 12/16/2015  . Foot swelling 12/16/2015  . Palpitations 10/21/2015  . CVA (cerebral vascular accident) ([place]) 10/21/2015  . 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 ICH (intracerebral hemorrhage) ([place]) 09/14/2015  . Transient alteration of awareness   . Other vascular headache   . Aneurysm, cerebral, nonruptured   . Benign essential HTN   . Acute blood loss anemia   . Elevated troponin   . Essential hypertension   . HLD  (hyperlipidemia)   . Malignant neoplasm of colon ([place])   . Stroke (cerebrum) ([place]) 09/11/2015  . Intraparenchymal hemorrhage of brain ([place]) 09/10/2015  . Microcytosis   . PVD (peripheral vascular disease) ([place])   . Hypertension   . 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 Thrombocytopenia ([place]) 11/08/2014  . Acute appendicitis 10/28/2014  . Perforated appendicitis 10/28/2014    Past Surgical History:  Procedure Laterality Date  . ABDOMINAL HYSTERECTOMY  2009  . APPENDECTOMY  10/28/2014  . CYST REMOVAL NECK    . IR GENERIC HISTORICAL  02/20/2016   IR RADIOLOGIST EVAL & MGMT 02/20/2016 [facility]  . IR GENERIC HISTORICAL  06/13/2016   IR RADIOLOGIST EVAL & MGMT 06/13/2016 [facility]  . LAPAROSCOPIC APPENDECTOMY N/A 10/28/2014   Procedure: APPENDECTOMY LAPAROSCOPIC;  Surgeon: Erroll Luna, MD;  Location: [facility];  Service: General;  Laterality: N/A;     OB History   No obstetric history on file.      Home Medications    Prior to Admission medications   Medication Sig Start Date End Date Taking? Authorizing Provider  acetaminophen (TYLENOL) 325 MG tablet Take 650 mg by mouth daily as needed for moderate pain.    Yes [provider]  aspirin EC 81 MG tablet Take 1 tablet (81 mg total) by mouth daily. 04/16/16  Yes Rosalin Hawking, MD  carboxymethylcellulose (REFRESH PLUS) 0.5 % SOLN Place 1 drop into both eyes as needed (dry eyes).   Yes [provider]  cholecalciferol (VITAMIN D3) 25 MCG (1000 UT) tablet Take 5,000 Units by mouth daily.   Yes [provider]  ferrous sulfate 325 (65 FE) MG tablet Take 1 tablet (325 mg total) by mouth daily with breakfast. 09/23/15  Yes Angiulli, Lavon Paganini, PA-C  furosemide (LASIX) 20 MG tablet Take 20 mg by mouth daily.  04/11/16  Yes [provider]  pantoprazole (PROTONIX) 40 MG tablet Take 1 tablet (40 mg total) by mouth daily at 12 noon. 09/23/15  Yes Angiulli, Lavon Paganini, PA-C  rosuvastatin (CRESTOR) 20 MG  tablet Take 20 mg by mouth daily. 09/17/16  Yes [provider]  rosuvastatin (CRESTOR) 10 MG tablet Take 1 tablet (10 mg total) by mouth daily. Patient not taking: Reported on 06/08/2018 09/23/15   Angiulli, Lavon Paganini, PA-C    Family History Family History  Problem Relation Age of Onset  . Diabetes Mother   . Lung cancer Father   . Transient ischemic attack Son   . Colon cancer Brother   . Alcohol abuse Other     Social History Social History   Tobacco Use  . Smoking status: Former Research scientist (life sciences)  . Smokeless tobacco: Never Used  . Tobacco comment: quit smoking years ago   Substance Use Topics  . Alcohol use: No  . Drug use: No     Allergies   Latex   Review of Systems Review of Systems  Constitutional: Negative for fever.  Respiratory: Negative for shortness of breath.   Cardiovascular: Positive for syncope. Negative for chest pain.  Gastrointestinal: Negative for vomiting.  Neurological: Positive for light-headedness. Negative for headaches.       Denies new focal weakness  All other systems reviewed and are negative.    Physical Exam Updated Vital Signs BP 128/61 (BP Location: Right Arm)   Pulse 63   Temp 98 F (36.7 C) (Oral)   Resp 18   Ht 1.575 m (5\' 2" )   Wt 77.1 kg   SpO2 100%   BMI 31.09 kg/m   Physical Exam CONSTITUTIONAL: Elderly no acute distress HEAD: Normocephalic/atraumatic EYES: EOMI/PERRL ENMT: Mucous membranes moist NECK: supple no meningeal signs SPINE/BACK:entire spine nontender CV: S1/S2 noted, no murmurs/rubs/gallops noted LUNGS: Lungs are clear to auscultation bilaterally, no apparent distress ABDOMEN: soft, nontender, no rebound or guarding, bowel sounds noted throughout abdomen GU:no cva tenderness NEURO: Pt is awake/alert/appropriate, moves all extremitiesx4.  No facial droop.  GCS 15.  No arm or leg drift EXTREMITIES: pulses normal/equal, full ROM SKIN: warm, color normal PSYCH: no abnormalities of mood noted, alert and  oriented to situation   ED Treatments / Results  Labs (all labs ordered are listed, but only abnormal results are displayed) Labs Reviewed  BASIC METABOLIC PANEL - Abnormal; Notable for the following components:      Result Value   Glucose, Bld 108 (*)    BUN 27 (*)    Creatinine, Ser 1.21 (*)    GFR calc non Af Amer 44 (*)    GFR calc Af Amer 51 (*)    All other components within normal limits  CBC WITH DIFFERENTIAL/PLATELET - Abnormal; Notable for the following components:   Hemoglobin 10.9 (*)    HCT 35.9 (*)    MCV 77.7 (*)    MCH 23.6 (*)    RDW 16.3 (*)    All other components within normal limits  URINALYSIS, ROUTINE W REFLEX MICROSCOPIC - Abnormal; Notable for the following components:   APPearance HAZY (*)    Protein,  ur 30 (*)    Leukocytes,Ua TRACE (*)    Bacteria, UA RARE (*)    All other components within normal limits    EKG EKG Interpretation  Date/Time:  Friday March 06 2019 00:35:04 EDT Ventricular Rate:  67 PR Interval:  168 QRS Duration: 82 QT Interval:  428 QTC Calculation: 452 R Axis:   -52 Text Interpretation:  Normal sinus rhythm Left anterior fascicular block Moderate voltage criteria for LVH, may be normal variant Abnormal ECG No significant change since last tracing Confirmed by Ripley Fraise [phone]) on 03/06/2019 1:13:41 AM   Radiology No results found.  Procedures Procedures   Medications Ordered in ED Medications  sodium chloride 0.9 % bolus 1,000 mL (1,000 mLs Intravenous New Bag/Given 03/06/19 0228)     Initial Impression / Assessment and Plan / ED Course  I have reviewed the triage vital signs and the nursing notes.  Pertinent labs results that were available during my care of the patient were reviewed by me and considered in my medical decision making (see chart for details).        Patient presents for near syncopal episode while in the shower.  She is now back to baseline. Daughter at bedside who confirms story.   No new medicines.  Patient is been eating well. She has had similar episodes before. Daughter does not live with patient but does come to assist frequently.  They are interested in home health. 4:56 AM Patient is overall improved.  Vitals appropriate.  Labs reassuring.  No acute EKG changes. No dysrhythmias noted on telemetry monitoring Patient prefers to go home.  She demonstrated ability to ambulate with a walker which she uses at home.  She feels back to baseline. Unclear cause of near syncope.  However at this time no signs of any dysrhythmia. No signs of acute stroke Home health has been ordered for patient. Patient/daughter agreeable with plan Final Clinical Impressions(s) / ED Diagnoses   Final diagnoses:  Near syncope    ED Discharge Orders    None       Ripley Fraise, MD 03/06/19 [phone]

## 2019-03-06 NOTE — ED Notes (Signed)
Phlebotomy at bedside.

## 2019-03-06 NOTE — ED Notes (Signed)
Attempted to get urine specimen from pt but pt stated she did not feel the urge to void currently.

## 2019-03-06 NOTE — Telephone Encounter (Signed)
ED CM received CM consult via ED CM Pool concerning arranging [facility] services. CM attempted to reach patient at number listed in record no answer and no VM set up CM will make a 2nd attempt tomorrow.

## 2019-03-07 ENCOUNTER — Telehealth: Payer: Self-pay | Admitting: Surgery

## 2019-03-14 ENCOUNTER — Telehealth: Payer: Self-pay | Admitting: Surgery

## 2019-03-14 NOTE — Telephone Encounter (Signed)
Addendum: 9/5 ED CM called and spoke with patient concerning [place] recommendations patient was agreeable. Offered choice according the CMS quality rating list.  Patient agreeable with forwarding orders to [facility], [facility], [facility]. [place] agencies.  Referral faxed via [facility],

## 2019-10-24 ENCOUNTER — Emergency Department (HOSPITAL_COMMUNITY): Payer: Medicare Other

## 2019-10-24 ENCOUNTER — Other Ambulatory Visit: Payer: Self-pay

## 2019-10-24 ENCOUNTER — Emergency Department (HOSPITAL_COMMUNITY)
Admission: EM | Admit: 2019-10-24 | Discharge: 2019-10-25 | Disposition: A | Discharge status: Home / Self Care | Payer: Medicare Other | Attending: Emergency Medicine | Admitting: Emergency Medicine

## 2019-10-24 ENCOUNTER — Encounter (HOSPITAL_COMMUNITY): Payer: Self-pay | Admitting: Emergency Medicine

## 2019-10-24 DIAGNOSIS — Z8673 Personal history of transient ischemic attack (TIA), and cerebral infarction without residual deficits: Secondary | ICD-10-CM | POA: Diagnosis not present

## 2019-10-24 DIAGNOSIS — M545 Low back pain, unspecified: Secondary | ICD-10-CM

## 2019-10-24 DIAGNOSIS — Z7982 Long term (current) use of aspirin: Secondary | ICD-10-CM | POA: Diagnosis not present

## 2019-10-24 DIAGNOSIS — Z87891 Personal history of nicotine dependence: Secondary | ICD-10-CM | POA: Insufficient documentation

## 2019-10-24 DIAGNOSIS — R300 Dysuria: Secondary | ICD-10-CM | POA: Diagnosis not present

## 2019-10-24 DIAGNOSIS — Z79899 Other long term (current) drug therapy: Secondary | ICD-10-CM | POA: Insufficient documentation

## 2019-10-24 DIAGNOSIS — I10 Essential (primary) hypertension: Secondary | ICD-10-CM | POA: Insufficient documentation

## 2019-10-24 DIAGNOSIS — Z91040 Latex allergy status: Secondary | ICD-10-CM | POA: Insufficient documentation

## 2019-10-24 DIAGNOSIS — Z8509 Personal history of malignant neoplasm of other digestive organs: Secondary | ICD-10-CM | POA: Insufficient documentation

## 2019-10-24 MED ORDER — KETOROLAC TROMETHAMINE 30 MG/ML IJ SOLN
30.0000 mg | Freq: Once | INTRAMUSCULAR | Status: AC
  Administered 2019-10-24: 30 mg via INTRAMUSCULAR
  Filled 2019-10-24: qty 1

## 2019-10-24 MED ORDER — ACETAMINOPHEN 500 MG PO TABS
1000.0000 mg | ORAL_TABLET | Freq: Once | ORAL | Status: AC
  Administered 2019-10-24: 1000 mg via ORAL
  Filled 2019-10-24: qty 2

## 2019-10-24 MED ORDER — LIDOCAINE 5 % EX PTCH
1.0000 | MEDICATED_PATCH | Freq: Once | CUTANEOUS | Status: DC
  Administered 2019-10-24: 20:00:00 1 via TRANSDERMAL
  Filled 2019-10-24: qty 1

## 2019-10-24 NOTE — ED Notes (Signed)
Called pt x3 for room, no response. Checked outside for pt, pt not found.

## 2019-10-24 NOTE — ED Triage Notes (Signed)
EMS stated, back pain for 3 days.

## 2019-10-24 NOTE — ED Provider Notes (Signed)
[facility] EMERGENCY DEPARTMENT Provider Note   CSN: [id number] Arrival date & time: 10/24/19  1243     History Chief Complaint  Patient presents with  . Back Pain    Andrea Keller is a 74 y.o. female.  HPI   74 year old female with a pmh of HTN, osteoarthritis, previous CVA, presenting to the ED complaining of 3 days of low back pain. The patient describes the pain as severe and achy bilateral paraspinal low back, waxing and waning. Treatments tried tylenol and alcohol rub with improvement. She denies urinary incontinence, fecal incontinence, changes in perineal sensation, changes in lower extremity sensation. She denies new trauma to the lower back, previous surgeries on the lower back.  Pain is improved with lying on her left side.  Patient reports the pain is worsened with standing or sitting.  Patient denies any sharp shooting pains going down her leg.  Patient does not recall any heavy lifting or twisting induced pain.  Patient reports that she does not work but does do chores around the house.  She noted the pain actually started 2 weeks ago but has become worse over the past 3 days.  Patient also reports having dysuria.  She denies any nausea, vomiting, fevers, chills, abdominal pain, chest pain, shortness of breath, hematuria, frequency or urgency.  She has not had a UTI before.  Patient has not had any kidney stones reported.  Patient notes that she is able to ambulate independently with discomfort  Past Medical History:  Diagnosis Date  . Diverticulosis   . 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 Hyperplastic colon polyp   . Hypertension   . Insomnia   . Microcytosis   . Osteoarthritis   . PVD (peripheral vascular disease) ([place])   . Stroke [facility])     Patient Active Problem List   Diagnosis Date Noted  . Cerebrovascular accident (CVA) due to embolism of cerebral artery ([place]) 12/16/2015  . Foot swelling 12/16/2015  . Palpitations 10/21/2015  . CVA  (cerebral vascular accident) ([place]) 10/21/2015  . 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 ICH (intracerebral hemorrhage) ([place]) 09/14/2015  . Transient alteration of awareness   . Other vascular headache   . Aneurysm, cerebral, nonruptured   . Benign essential HTN   . Acute blood loss anemia   . Elevated troponin   . Essential hypertension   . HLD (hyperlipidemia)   . Malignant neoplasm of colon ([place])   . Stroke (cerebrum) ([place]) 09/11/2015  . Intraparenchymal hemorrhage of brain ([place]) 09/10/2015  . Microcytosis   . PVD (peripheral vascular disease) ([place])   . Hypertension   . 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 Thrombocytopenia ([place]) 11/08/2014  . Acute appendicitis 10/28/2014  . Perforated appendicitis 10/28/2014    Past Surgical History:  Procedure Laterality Date  . ABDOMINAL HYSTERECTOMY  2009  . APPENDECTOMY  10/28/2014  . CYST REMOVAL NECK    . IR GENERIC HISTORICAL  02/20/2016   IR RADIOLOGIST EVAL & MGMT 02/20/2016 [facility]INTER[facility] RAD  . IR GENERIC HISTORICAL  06/13/2016   IR RADIOLOGIST EVAL & MGMT 06/13/2016 [facility]  . LAPAROSCOPIC APPENDECTOMY N/A 10/28/2014   Procedure: APPENDECTOMY LAPAROSCOPIC;  Surgeon: Erroll Luna, MD;  Location: [facility];  Service: General;  Laterality: N/A;     OB History   No obstetric history on file.     Family History  Problem Relation Age of Onset  . Diabetes Mother   . Lung cancer Father   .  Transient ischemic attack Son   . Colon cancer Brother   . Alcohol abuse Other     Social History   Tobacco Use  . Smoking status: Former Research scientist (life sciences)  . Smokeless tobacco: Never Used  . Tobacco comment: quit smoking years ago   Substance Use Topics  . Alcohol use: No  . Drug use: No    Home Medications Prior to Admission medications   Medication Sig Start Date End Date Taking? Authorizing Provider  acetaminophen (TYLENOL) 325 MG tablet Take 650 mg by mouth daily as needed for moderate pain.     [provider]    aspirin EC 81 MG tablet Take 1 tablet (81 mg total) by mouth daily. 04/16/16   Rosalin Hawking, MD  carboxymethylcellulose (REFRESH PLUS) 0.5 % SOLN Place 1 drop into both eyes as needed (dry eyes).    [provider]  cholecalciferol (VITAMIN D3) 25 MCG (1000 UT) tablet Take 5,000 Units by mouth daily.    [provider]  ferrous sulfate 325 (65 FE) MG tablet Take 1 tablet (325 mg total) by mouth daily with breakfast. 09/23/15   Angiulli, Lavon Paganini, PA-C  furosemide (LASIX) 20 MG tablet Take 20 mg by mouth daily.  04/11/16   [provider]  pantoprazole (PROTONIX) 40 MG tablet Take 1 tablet (40 mg total) by mouth daily at 12 noon. 09/23/15   Angiulli, Lavon Paganini, PA-C  rosuvastatin (CRESTOR) 10 MG tablet Take 1 tablet (10 mg total) by mouth daily. Patient not taking: Reported on 06/08/2018 09/23/15   Angiulli, Lavon Paganini, PA-C  rosuvastatin (CRESTOR) 20 MG tablet Take 20 mg by mouth daily. 09/17/16   [provider]    Allergies    Latex  Review of Systems   Review of Systems  Constitutional: Negative for activity change, appetite change, chills, diaphoresis, fatigue and fever.  HENT: Negative for congestion and rhinorrhea.   Respiratory: Negative for cough, shortness of breath and wheezing.   Cardiovascular: Negative for chest pain.  Gastrointestinal: Negative for abdominal distention, abdominal pain, diarrhea, nausea and vomiting.  Genitourinary: Positive for dysuria. Negative for decreased urine volume, difficulty urinating, frequency, hematuria and urgency.  Musculoskeletal: Positive for back pain. Negative for gait problem.  Skin: Negative for color change and wound.  Neurological: Negative for dizziness, syncope, weakness, light-headedness and headaches.  All other systems reviewed and are negative.   Physical Exam Updated Vital Signs BP (!) 155/54 (BP Location: Left Arm)   Pulse 66   Temp 98 F (36.7 C) (Oral)   Resp 16   Ht 5\' 2"  (1.575 m)   Wt  72.1 kg   SpO2 100%   BMI 29.08 kg/m   Physical Exam Vitals and nursing note reviewed.  Constitutional:      General: She is not in acute distress.    Appearance: Normal appearance. She is normal weight. She is not ill-appearing.  HENT:     Head: Normocephalic.     Right Ear: External ear normal.     Left Ear: External ear normal.     Nose: Nose normal.     Mouth/Throat:     Mouth: Mucous membranes are moist.     Pharynx: Oropharynx is clear.  Eyes:     Extraocular Movements: Extraocular movements intact.  Neck:     Comments: No midline tenderness to palpation of the C, T, L spine without step-offs or deformities Cardiovascular:     Rate and Rhythm: Normal rate and regular rhythm.  Pulses: Normal pulses.     Heart sounds: Normal heart sounds.  Pulmonary:     Effort: Pulmonary effort is normal. No respiratory distress.     Breath sounds: Normal breath sounds. No wheezing or rhonchi.  Abdominal:     General: Bowel sounds are normal.     Palpations: Abdomen is soft.     Tenderness: There is no abdominal tenderness. There is no guarding.  Musculoskeletal:     Cervical back: Normal range of motion.     Right lower leg: No edema.     Left lower leg: No edema.     Comments: No tenderness to palpation of the midline lumbar area and minimal pain upon palpation to the bilateral paraspinal musculature in the low back.  Skin:    General: Skin is warm and dry.     Capillary Refill: Capillary refill takes less than 2 seconds.  Neurological:     General: No focal deficit present.     Mental Status: She is alert and oriented to person, place, and time. Mental status is at baseline.     Cranial Nerves: No cranial nerve deficit.     Sensory: No sensory deficit.     Motor: No weakness.     Coordination: Coordination normal.     Gait: Gait normal.  Psychiatric:        Mood and Affect: Mood normal.     ED Results / Procedures / Treatments   Labs (all labs ordered are listed, but  only abnormal results are displayed) Labs Reviewed  [address]    EKG None  Radiology DG Lumbar Spine 2-3 Views  Result Date: 10/24/2019 CLINICAL DATA:  Back pain EXAM: LUMBAR SPINE - 2-3 VIEW COMPARISON:  None. FINDINGS: No fracture or dislocation. No obvious blastic or lytic osseous lesions are seen. Mild disc height loss with facet arthrosis most notable at L5-S1. There is diffuse osteopenia. Scattered vascular calcifications are noted. IMPRESSION: No definite acute osseous abnormality. However if further evaluation is required would recommend MRI. Electronically Signed   By: Prudencio Pair M.D.   On: 10/24/2019 21:44    Procedures Procedures (including critical care time)  Medications Ordered in ED Medications  lidocaine (LIDODERM) 5 % 1 patch (1 patch Transdermal Patch Applied 10/24/19 1958)  ketorolac (TORADOL) 30 MG/ML injection 30 mg (30 mg Intramuscular Given 10/24/19 2002)  acetaminophen (TYLENOL) tablet 1,000 mg (1,000 mg Oral Given 10/24/19 1958)    ED Course  I have reviewed the triage vital signs and the nursing notes.  Pertinent labs & imaging results that were available during my care of the patient were reviewed by me and considered in my medical decision making (see chart for details).    MDM Rules/Calculators/A&P                      74 year old female with a pmh of HTN, osteoarthritis, previous CVA, presenting to the ED complaining of 3 days of low back pain.   Likely musculoskeletal pain  Doubt cauda equina, conus medullaris syndrome or other epidural compression syndrome - no saddle paresthesias, no bowel/bladder incontinence, no UMN signs on physical exam, inconsistent with history and physical, low risk, primary diagnosis much more likely  Doubt abscess, epidural mass lesion, fracture, pyelonephritis, renal colic/infarct, AAA, aortic dissection, pna, pancreatitis, transverse myelitis, herpetic  neuralgia, as inconsistent with history and physical, low risk, primary diagnosis much more likely.  Given  patient's dysuria will evaluate with urine studies.  Lumbar x-rays negative for any osseous abnormality.  Do not believe MRI or other further imaging necessary at this time.  Recommend follow-up in outpatient setting for Further consideration of possible MRI.  Upon reassessment the patient's back pain is significantly improved.  Still waiting on the patient's urine studies.  Should the patient have infectious appearing urine we will treat in the outpatient setting with antibiotics for cystitis.  If negative, I feel patient is safe and stable for discharge with a plan to follow-up closely with her primary care physician for consideration of outpatient MRI.  Discussed this plan with the patient and her husband who expressed understanding and agreement with this plan.  Discussed adjunctive therapies for low back pain with the patient and her husband.  Provide information regarding acute low back pain as well as dysuria to the patient.  Return precautions provided.  Pt care was handed off to Dr. Randal Buba at [phone].  Complete history and physical and current plan have been communicated.  Please refer to their note for the remainder of ED care and ultimate disposition.  The plan for this patient was discussed with Dr. Reather Converse, who voiced agreement and who oversaw evaluation and treatment of this patient.  Final Clinical Impression(s) / ED Diagnoses Final diagnoses:  Low back pain, unspecified back pain laterality, unspecified chronicity, unspecified whether sciatica present  Dysuria    Rx / DC Orders ED Discharge Orders    None       Filbert Berthold, MD 10/25/19 [id number]    Elnora Morrison, MD 10/25/19 [phone]

## 2019-10-25 DIAGNOSIS — M545 Low back pain: Secondary | ICD-10-CM | POA: Diagnosis not present

## 2019-10-25 LAB — GRAM STAIN

## 2019-10-25 LAB — URINALYSIS, ROUTINE W REFLEX MICROSCOPIC
Bacteria, UA: NONE SEEN
Bilirubin Urine: NEGATIVE
Glucose, UA: NEGATIVE mg/dL
Hgb urine dipstick: NEGATIVE
Ketones, ur: NEGATIVE mg/dL
Leukocytes,Ua: NEGATIVE
Nitrite: NEGATIVE
Protein, ur: 30 mg/dL — AB
Specific Gravity, Urine: 1.025 (ref 1.005–1.030)
pH: 5 (ref 5.0–8.0)

## 2019-10-25 NOTE — ED Notes (Signed)
stright cathed pt for urine sample

## 2019-10-25 NOTE — ED Notes (Signed)
Pt verbalized understanding of d/c instructions, s/s requiring return to ed and follow up with GP. Pt had no additional questions at this time. Pt transported to exit via wheelchair.

## 2019-10-26 ENCOUNTER — Encounter (HOSPITAL_COMMUNITY): Payer: Self-pay | Admitting: Emergency Medicine

## 2019-10-26 ENCOUNTER — Emergency Department (HOSPITAL_COMMUNITY): Payer: Medicare Other

## 2019-10-26 ENCOUNTER — Emergency Department (HOSPITAL_COMMUNITY)
Admission: EM | Admit: 2019-10-26 | Discharge: 2019-10-26 | Disposition: A | Discharge status: Home / Self Care | Payer: Medicare Other | Attending: Emergency Medicine | Admitting: Emergency Medicine

## 2019-10-26 DIAGNOSIS — Z79899 Other long term (current) drug therapy: Secondary | ICD-10-CM | POA: Insufficient documentation

## 2019-10-26 DIAGNOSIS — M5416 Radiculopathy, lumbar region: Secondary | ICD-10-CM | POA: Diagnosis not present

## 2019-10-26 DIAGNOSIS — Z7982 Long term (current) use of aspirin: Secondary | ICD-10-CM | POA: Insufficient documentation

## 2019-10-26 DIAGNOSIS — M545 Low back pain: Secondary | ICD-10-CM | POA: Diagnosis present

## 2019-10-26 DIAGNOSIS — I10 Essential (primary) hypertension: Secondary | ICD-10-CM | POA: Diagnosis not present

## 2019-10-26 DIAGNOSIS — Z91040 Latex allergy status: Secondary | ICD-10-CM | POA: Diagnosis not present

## 2019-10-26 DIAGNOSIS — M546 Pain in thoracic spine: Secondary | ICD-10-CM | POA: Diagnosis not present

## 2019-10-26 DIAGNOSIS — Z87891 Personal history of nicotine dependence: Secondary | ICD-10-CM | POA: Insufficient documentation

## 2019-10-26 LAB — COMPREHENSIVE METABOLIC PANEL
ALT: 13 U/L (ref 0–44)
AST: 16 U/L (ref 15–41)
Albumin: 3.6 g/dL (ref 3.5–5.0)
Alkaline Phosphatase: 88 U/L (ref 38–126)
Anion gap: 10 (ref 5–15)
BUN: 19 mg/dL (ref 8–23)
CO2: 26 mmol/L (ref 22–32)
Calcium: 9.3 mg/dL (ref 8.9–10.3)
Chloride: 107 mmol/L (ref 98–111)
Creatinine, Ser: 1.35 mg/dL — ABNORMAL HIGH (ref 0.44–1.00)
GFR calc Af Amer: 44 mL/min — ABNORMAL LOW (ref >60)
GFR calc non Af Amer: 38 mL/min — ABNORMAL LOW (ref >60)
Glucose, Bld: 97 mg/dL (ref 70–99)
Potassium: 4.2 mmol/L (ref 3.5–5.1)
Sodium: 143 mmol/L (ref 135–145)
Total Bilirubin: 0.7 mg/dL (ref 0.3–1.2)
Total Protein: 7.3 g/dL (ref 6.5–8.1)

## 2019-10-26 LAB — CBC WITH DIFFERENTIAL/PLATELET
Abs Immature Granulocytes: 0.02 10*3/uL (ref 0.00–0.07)
Basophils Absolute: 0.1 10*3/uL (ref 0.0–0.1)
Basophils Relative: 1 %
Eosinophils Absolute: 0.5 10*3/uL (ref 0.0–0.5)
Eosinophils Relative: 5 %
HCT: 38.2 % (ref 36.0–46.0)
Hemoglobin: 11.5 g/dL — ABNORMAL LOW (ref 12.0–15.0)
Immature Granulocytes: 0 %
Lymphocytes Relative: 23 %
Lymphs Abs: 2.3 10*3/uL (ref 0.7–4.0)
MCH: 23.6 pg — ABNORMAL LOW (ref 26.0–34.0)
MCHC: 30.1 g/dL (ref 30.0–36.0)
MCV: 78.3 fL — ABNORMAL LOW (ref 80.0–100.0)
Monocytes Absolute: 1.2 10*3/uL — ABNORMAL HIGH (ref 0.1–1.0)
Monocytes Relative: 12 %
Neutro Abs: 6.2 10*3/uL (ref 1.7–7.7)
Neutrophils Relative %: 59 %
Platelets: 157 10*3/uL (ref 150–400)
RBC: 4.88 MIL/uL (ref 3.87–5.11)
RDW: 15.4 % (ref 11.5–15.5)
WBC: 10.3 10*3/uL (ref 4.0–10.5)
nRBC: 0 % (ref 0.0–0.2)

## 2019-10-26 LAB — URINE CULTURE: Culture: NO GROWTH

## 2019-10-26 LAB — URINALYSIS, ROUTINE W REFLEX MICROSCOPIC
Bilirubin Urine: NEGATIVE
Glucose, UA: NEGATIVE mg/dL
Hgb urine dipstick: NEGATIVE
Ketones, ur: NEGATIVE mg/dL
Leukocytes,Ua: NEGATIVE
Nitrite: NEGATIVE
Protein, ur: NEGATIVE mg/dL
Specific Gravity, Urine: 1.016 (ref 1.005–1.030)
pH: 6 (ref 5.0–8.0)

## 2019-10-26 MED ORDER — SODIUM CHLORIDE 0.9 % IV SOLN: INTRAVENOUS | Status: DC

## 2019-10-26 MED ORDER — HYDROCODONE-ACETAMINOPHEN 5-325 MG PO TABS: 1.0000 | ORAL_TABLET | Freq: Four times a day (QID) | ORAL | 0 refills | Status: AC | PRN

## 2019-10-26 MED ORDER — GADOBUTROL 1 MMOL/ML IV SOLN
7.0000 mL | Freq: Once | INTRAVENOUS | Status: AC | PRN
  Administered 2019-10-26: 18:00:00 7 mL via INTRAVENOUS

## 2019-10-26 MED ORDER — PREDNISONE 20 MG PO TABS
ORAL_TABLET | ORAL | 0 refills | Status: AC
Start: 2019-10-26 — End: ?

## 2019-10-26 MED ORDER — LORAZEPAM 2 MG/ML IJ SOLN
0.5000 mg | Freq: Once | INTRAMUSCULAR | Status: AC
  Administered 2019-10-26: 0.5 mg via INTRAVENOUS
  Filled 2019-10-26: qty 1

## 2019-10-26 MED ORDER — MORPHINE SULFATE (PF) 4 MG/ML IV SOLN
4.0000 mg | Freq: Once | INTRAVENOUS | Status: AC
  Administered 2019-10-26: 4 mg via INTRAVENOUS
  Filled 2019-10-26: qty 1

## 2019-10-26 NOTE — ED Notes (Signed)
Patient unable to sign for discharge as patient is in hallway bed. Patient verbalized understanding of discharge.

## 2019-10-26 NOTE — ED Triage Notes (Signed)
Per EMS- Patient is from home. Patient c/o lower back pain x 1 week. Patient was seen at [facility] ED for the same a week ago. Patient has left sided deficits from a previous stroke. Patient's husband was unable to move the patient on his own.

## 2019-10-26 NOTE — ED Notes (Signed)
Pt taken to restroom on steady.

## 2019-10-26 NOTE — ED Provider Notes (Signed)
  Physical Exam  BP (!) 118/55   Pulse (!) 57   Temp 98.7 F (37.1 C) (Oral)   Resp 18   SpO2 100%   Physical Exam  ED Course/Procedures     Procedures  MDM  Care assumed at 4 PM from Dr. Zenia Resides.  Patient has worsening back pain was seen about a week ago and pain is not improved with lidocaine patch. Patient had some questionable urinary incontinence as well.  Signout pending MRI lumbar spine and thoracic spine.  7:58 PM MRI showed moderate right L5-S1 stenosis.  No central canal stenosis.  Will discharge patient home with pain medicine and prednisone.  Will refer to neurosurgery outpatient as well.  No need for emergent neurosurgery evaluation.     Drenda Freeze, MD 10/26/19 [phone]

## 2019-10-26 NOTE — Discharge Instructions (Signed)
You have a pinched nerve in your back that is causing your pain.  Take Tylenol for pain.  Take Norco for severe pain  Take prednisone as well.  Follow-up with neurosurgery.  Return to ER if you have trouble urinating, numbness, weakness, unable to walk.

## 2019-10-26 NOTE — ED Triage Notes (Signed)
Patient states she was a [facility] for the same symptoms-gave her some Lidocaine patches for her back but did not treat urinary symptoms-states "her urine does not want to come out"

## 2019-10-26 NOTE — ED Notes (Signed)
Family at bedside. 

## 2019-10-26 NOTE — ED Provider Notes (Signed)
[facility] DEPT Provider Note   CSN: [id number] Arrival date & time: 10/26/19  1012     History Chief Complaint  Patient presents with  . Back Pain  . Dysuria    Andrea Keller is a 74 y.o. female.  74 year old female presents with worsening atraumatic thoracic and lumbar back pain.  Patient's pain is characterizes sharp and worse with any movements.  Denies any urinary symptoms.  No fever or chills.  Pain does not radiate down to her legs.  No bowel or bladder dysfunction.  Was seen at [facility] few days ago for similar symptoms.  That visit reviewed and she had plain films of her lumbar spine.  Her pain had been controlled at that time with lidocaine patch as well as Toradol.  Was prescribed medications for home which she has been taking which have not helped.  Does use a walker on a regular basis and has had decreased ability to do that        Past Medical History:  Diagnosis Date  . Diverticulosis   . 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 Hyperplastic colon polyp   . Hypertension   . Insomnia   . Microcytosis   . Osteoarthritis   . PVD (peripheral vascular disease) ([place])   . Stroke [facility])     Patient Active Problem List   Diagnosis Date Noted  . 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 Foot swelling 12/16/2015  . Palpitations 10/21/2015  . CVA (cerebral vascular accident) ([place]) 10/21/2015  . 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 ICH (intracerebral hemorrhage) ([place]) 09/14/2015  . Transient alteration of awareness   . Other vascular headache   . Aneurysm, cerebral, nonruptured   . Benign essential HTN   . Acute blood loss anemia   . Elevated troponin   . Essential hypertension   . HLD (hyperlipidemia)   . Malignant neoplasm of colon ([place])   . Stroke (cerebrum) ([place]) 09/11/2015  . Intraparenchymal hemorrhage of brain ([place]) 09/10/2015  . Microcytosis   . PVD (peripheral  vascular disease) ([place])   . Hypertension   . 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 Thrombocytopenia ([place]) 11/08/2014  . Acute appendicitis 10/28/2014  . Perforated appendicitis 10/28/2014    Past Surgical History:  Procedure Laterality Date  . ABDOMINAL HYSTERECTOMY  2009  . APPENDECTOMY  10/28/2014  . CYST REMOVAL NECK    . IR GENERIC HISTORICAL  02/20/2016   IR RADIOLOGIST EVAL & MGMT 02/20/2016 [facility]  . IR GENERIC HISTORICAL  06/13/2016   IR RADIOLOGIST EVAL & MGMT 06/13/2016 [facility]  . LAPAROSCOPIC APPENDECTOMY N/A 10/28/2014   Procedure: APPENDECTOMY LAPAROSCOPIC;  Surgeon: Erroll Luna, MD;  Location: [facility];  Service: General;  Laterality: N/A;     OB History   No obstetric history on file.     Family History  Problem Relation Age of Onset  . Diabetes Mother   . Lung cancer Father   . Transient ischemic attack Son   . Colon cancer Brother   . Alcohol abuse Other     Social History   Tobacco Use  . Smoking status: Former Research scientist (life sciences)  . Smokeless tobacco: Never Used  . Tobacco comment: quit smoking years ago   Substance Use Topics  . Alcohol use: No  . Drug use: No    Home Medications Prior to Admission medications   Medication Sig Start Date End Date Taking? Authorizing Provider  acetaminophen (TYLENOL)  325 MG tablet Take 650 mg by mouth daily as needed for moderate pain.     [provider]  aspirin EC 81 MG tablet Take 1 tablet (81 mg total) by mouth daily. 04/16/16   Rosalin Hawking, MD  carboxymethylcellulose (REFRESH PLUS) 0.5 % SOLN Place 1 drop into both eyes as needed (dry eyes).    [provider]  cholecalciferol (VITAMIN D3) 25 MCG (1000 UT) tablet Take 5,000 Units by mouth daily.    [provider]  ferrous sulfate 325 (65 FE) MG tablet Take 1 tablet (325 mg total) by mouth daily with breakfast. 09/23/15   Angiulli, Lavon Paganini, PA-C  furosemide (LASIX) 20 MG tablet Take 20 mg by mouth daily.  04/11/16   [provider]  pantoprazole (PROTONIX) 40 MG tablet Take 1 tablet (40 mg total) by mouth daily at 12 noon. 09/23/15   Angiulli, Lavon Paganini, PA-C  rosuvastatin (CRESTOR) 10 MG tablet Take 1 tablet (10 mg total) by mouth daily. Patient not taking: Reported on 06/08/2018 09/23/15   Angiulli, Lavon Paganini, PA-C  rosuvastatin (CRESTOR) 20 MG tablet Take 20 mg by mouth daily. 09/17/16   [provider]    Allergies    Latex  Review of Systems   Review of Systems  All other systems reviewed and are negative.   Physical Exam Updated Vital Signs BP (!) 175/79 (BP Location: Left Arm)   Pulse 65   Temp 98.7 F (37.1 C) (Oral)   Resp 18   SpO2 100%   Physical Exam Vitals and nursing note reviewed.  Constitutional:      General: She is not in acute distress.    Appearance: Normal appearance. She is well-developed. She is not toxic-appearing.  HENT:     Head: Normocephalic and atraumatic.  Eyes:     General: Lids are normal.     Conjunctiva/sclera: Conjunctivae normal.     Pupils: Pupils are equal, round, and reactive to light.  Neck:     Thyroid: No thyroid mass.     Trachea: No tracheal deviation.  Cardiovascular:     Rate and Rhythm: Normal rate and regular rhythm.     Heart sounds: Normal heart sounds. No murmur. No gallop.   Pulmonary:     Effort: Pulmonary effort is normal. No respiratory distress.     Breath sounds: Normal breath sounds. No stridor. No decreased breath sounds, wheezing, rhonchi or rales.  Abdominal:     General: Bowel sounds are normal. There is no distension.     Palpations: Abdomen is soft.     Tenderness: There is no abdominal tenderness. There is no rebound.  Musculoskeletal:        General: Normal range of motion.     Cervical back: Normal range of motion and neck supple.     Thoracic back: Tenderness and bony tenderness present.     Lumbar back: Tenderness and bony tenderness present.       Back:  Skin:    General: Skin is warm and dry.      Findings: No abrasion or rash.  Neurological:     Mental Status: She is alert and oriented to person, place, and time.     GCS: GCS eye subscore is 4. GCS verbal subscore is 5. GCS motor subscore is 6.     Cranial Nerves: No cranial nerve deficit.     Sensory: No sensory deficit.     Motor: No weakness or tremor.  Deep Tendon Reflexes:     Reflex Scores:      Patellar reflexes are 1+ on the right side and 1+ on the left side. Psychiatric:        Speech: Speech normal.        Behavior: Behavior normal.     ED Results / Procedures / Treatments   Labs (all labs ordered are listed, but only abnormal results are displayed) Labs Reviewed  URINALYSIS, ROUTINE W REFLEX MICROSCOPIC  CBC WITH DIFFERENTIAL/PLATELET  COMPREHENSIVE METABOLIC PANEL    EKG None  Radiology DG Lumbar Spine 2-3 Views  Result Date: 10/24/2019 CLINICAL DATA:  Back pain EXAM: LUMBAR SPINE - 2-3 VIEW COMPARISON:  None. FINDINGS: No fracture or dislocation. No obvious blastic or lytic osseous lesions are seen. Mild disc height loss with facet arthrosis most notable at L5-S1. There is diffuse osteopenia. Scattered vascular calcifications are noted. IMPRESSION: No definite acute osseous abnormality. However if further evaluation is required would recommend MRI. Electronically Signed   By: Prudencio Pair M.D.   On: 10/24/2019 21:44    Procedures Procedures (including critical care time)  Medications Ordered in ED Medications  0.9 %  sodium chloride infusion (has no administration in time range)  morphine 4 MG/ML injection 4 mg (has no administration in time range)  LORazepam (ATIVAN) injection 0.5 mg (has no administration in time range)    ED Course  I have reviewed the triage vital signs and the nursing notes.  Pertinent labs & imaging results that were available during my care of the patient were reviewed by me and considered in my medical decision making (see chart for details).    MDM  Rules/Calculators/A&P                      Patient given medications for pain here and does feel better.  MRI of thoracic and lumbar spine pending at this time.  Urinalysis negative.  Will sign out to Dr. Darl Householder Final Clinical Impression(s) / ED Diagnoses Final diagnoses:  None    Rx / DC Orders ED Discharge Orders    None       Lacretia Leigh, MD 10/26/19 [phone]

## 2020-01-18 ENCOUNTER — Other Ambulatory Visit: Payer: Self-pay

## 2020-01-18 ENCOUNTER — Emergency Department (HOSPITAL_COMMUNITY): Payer: Medicare Other

## 2020-01-18 ENCOUNTER — Inpatient Hospital Stay (HOSPITAL_COMMUNITY)
Admission: EM | Admit: 2020-01-18 | Discharge: 2020-01-31 | DRG: 064 | Disposition: E | Payer: Medicare Other | Attending: Pulmonary Disease | Admitting: Pulmonary Disease

## 2020-01-18 ENCOUNTER — Inpatient Hospital Stay (HOSPITAL_COMMUNITY): Payer: Medicare Other

## 2020-01-18 ENCOUNTER — Encounter (HOSPITAL_COMMUNITY): Payer: Self-pay

## 2020-01-18 DIAGNOSIS — D696 Thrombocytopenia, unspecified: Secondary | ICD-10-CM | POA: Diagnosis present

## 2020-01-18 DIAGNOSIS — I639 Cerebral infarction, unspecified: Secondary | ICD-10-CM | POA: Diagnosis not present

## 2020-01-18 DIAGNOSIS — J9811 Atelectasis: Secondary | ICD-10-CM | POA: Diagnosis present

## 2020-01-18 DIAGNOSIS — Z66 Do not resuscitate: Secondary | ICD-10-CM | POA: Diagnosis not present

## 2020-01-18 DIAGNOSIS — Z4659 Encounter for fitting and adjustment of other gastrointestinal appliance and device: Secondary | ICD-10-CM

## 2020-01-18 DIAGNOSIS — R4182 Altered mental status, unspecified: Secondary | ICD-10-CM | POA: Diagnosis not present

## 2020-01-18 DIAGNOSIS — E785 Hyperlipidemia, unspecified: Secondary | ICD-10-CM | POA: Diagnosis present

## 2020-01-18 DIAGNOSIS — J9601 Acute respiratory failure with hypoxia: Secondary | ICD-10-CM | POA: Diagnosis not present

## 2020-01-18 DIAGNOSIS — Z7189 Other specified counseling: Secondary | ICD-10-CM

## 2020-01-18 DIAGNOSIS — Z01818 Encounter for other preprocedural examination: Secondary | ICD-10-CM

## 2020-01-18 DIAGNOSIS — R7303 Prediabetes: Secondary | ICD-10-CM | POA: Diagnosis present

## 2020-01-18 DIAGNOSIS — N179 Acute kidney failure, unspecified: Secondary | ICD-10-CM | POA: Diagnosis not present

## 2020-01-18 DIAGNOSIS — I63441 Cerebral infarction due to embolism of right cerebellar artery: Principal | ICD-10-CM | POA: Diagnosis present

## 2020-01-18 DIAGNOSIS — I6930 Unspecified sequelae of cerebral infarction: Secondary | ICD-10-CM | POA: Diagnosis not present

## 2020-01-18 DIAGNOSIS — I6340 Cerebral infarction due to embolism of unspecified cerebral artery: Secondary | ICD-10-CM

## 2020-01-18 DIAGNOSIS — I63412 Cerebral infarction due to embolism of left middle cerebral artery: Secondary | ICD-10-CM | POA: Diagnosis not present

## 2020-01-18 DIAGNOSIS — Z87891 Personal history of nicotine dependence: Secondary | ICD-10-CM | POA: Diagnosis not present

## 2020-01-18 DIAGNOSIS — M545 Low back pain: Secondary | ICD-10-CM | POA: Diagnosis present

## 2020-01-18 DIAGNOSIS — R29810 Facial weakness: Secondary | ICD-10-CM | POA: Diagnosis present

## 2020-01-18 DIAGNOSIS — Z20822 Contact with and (suspected) exposure to covid-19: Secondary | ICD-10-CM | POA: Diagnosis present

## 2020-01-18 DIAGNOSIS — R569 Unspecified convulsions: Secondary | ICD-10-CM | POA: Diagnosis not present

## 2020-01-18 DIAGNOSIS — I959 Hypotension, unspecified: Secondary | ICD-10-CM | POA: Diagnosis not present

## 2020-01-18 DIAGNOSIS — G8114 Spastic hemiplegia affecting left nondominant side: Secondary | ICD-10-CM | POA: Diagnosis present

## 2020-01-18 DIAGNOSIS — R29724 NIHSS score 24: Secondary | ICD-10-CM | POA: Diagnosis not present

## 2020-01-18 DIAGNOSIS — I720 Aneurysm of carotid artery: Secondary | ICD-10-CM | POA: Diagnosis present

## 2020-01-18 DIAGNOSIS — R519 Headache, unspecified: Secondary | ICD-10-CM | POA: Diagnosis present

## 2020-01-18 DIAGNOSIS — I700 Atherosclerosis of aorta: Secondary | ICD-10-CM | POA: Diagnosis present

## 2020-01-18 DIAGNOSIS — Z833 Family history of diabetes mellitus: Secondary | ICD-10-CM

## 2020-01-18 DIAGNOSIS — N1832 Chronic kidney disease, stage 3b: Secondary | ICD-10-CM | POA: Diagnosis present

## 2020-01-18 DIAGNOSIS — M1990 Unspecified osteoarthritis, unspecified site: Secondary | ICD-10-CM | POA: Diagnosis present

## 2020-01-18 DIAGNOSIS — I739 Peripheral vascular disease, unspecified: Secondary | ICD-10-CM | POA: Diagnosis present

## 2020-01-18 DIAGNOSIS — Z9181 History of falling: Secondary | ICD-10-CM

## 2020-01-18 DIAGNOSIS — Z7982 Long term (current) use of aspirin: Secondary | ICD-10-CM

## 2020-01-18 DIAGNOSIS — Z9289 Personal history of other medical treatment: Secondary | ICD-10-CM

## 2020-01-18 DIAGNOSIS — I10 Essential (primary) hypertension: Secondary | ICD-10-CM | POA: Diagnosis not present

## 2020-01-18 DIAGNOSIS — I129 Hypertensive chronic kidney disease with stage 1 through stage 4 chronic kidney disease, or unspecified chronic kidney disease: Secondary | ICD-10-CM | POA: Diagnosis present

## 2020-01-18 DIAGNOSIS — I69354 Hemiplegia and hemiparesis following cerebral infarction affecting left non-dominant side: Secondary | ICD-10-CM

## 2020-01-18 DIAGNOSIS — Z515 Encounter for palliative care: Secondary | ICD-10-CM | POA: Diagnosis not present

## 2020-01-18 DIAGNOSIS — I351 Nonrheumatic aortic (valve) insufficiency: Secondary | ICD-10-CM | POA: Diagnosis not present

## 2020-01-18 DIAGNOSIS — R400 Somnolence: Secondary | ICD-10-CM | POA: Diagnosis not present

## 2020-01-18 DIAGNOSIS — R001 Bradycardia, unspecified: Secondary | ICD-10-CM | POA: Diagnosis not present

## 2020-01-18 DIAGNOSIS — E876 Hypokalemia: Secondary | ICD-10-CM | POA: Diagnosis not present

## 2020-01-18 DIAGNOSIS — R54 Age-related physical debility: Secondary | ICD-10-CM | POA: Diagnosis present

## 2020-01-18 DIAGNOSIS — E782 Mixed hyperlipidemia: Secondary | ICD-10-CM | POA: Diagnosis not present

## 2020-01-18 LAB — DIFFERENTIAL
Abs Immature Granulocytes: 0.02 10*3/uL (ref 0.00–0.07)
Basophils Absolute: 0.1 10*3/uL (ref 0.0–0.1)
Basophils Relative: 1 %
Eosinophils Absolute: 0.4 10*3/uL (ref 0.0–0.5)
Eosinophils Relative: 5 %
Immature Granulocytes: 0 %
Lymphocytes Relative: 24 %
Lymphs Abs: 2.2 10*3/uL (ref 0.7–4.0)
Monocytes Absolute: 1 10*3/uL (ref 0.1–1.0)
Monocytes Relative: 11 %
Neutro Abs: 5.5 10*3/uL (ref 1.7–7.7)
Neutrophils Relative %: 59 %

## 2020-01-18 LAB — CBC
HCT: 37.6 % (ref 36.0–46.0)
Hemoglobin: 11.1 g/dL — ABNORMAL LOW (ref 12.0–15.0)
MCH: 23.5 pg — ABNORMAL LOW (ref 26.0–34.0)
MCHC: 29.5 g/dL — ABNORMAL LOW (ref 30.0–36.0)
MCV: 79.7 fL — ABNORMAL LOW (ref 80.0–100.0)
Platelets: 95 10*3/uL — ABNORMAL LOW (ref 150–400)
RBC: 4.72 MIL/uL (ref 3.87–5.11)
RDW: 15.9 % — ABNORMAL HIGH (ref 11.5–15.5)
WBC: 9.2 10*3/uL (ref 4.0–10.5)
nRBC: 0 % (ref 0.0–0.2)

## 2020-01-18 LAB — COMPREHENSIVE METABOLIC PANEL
ALT: 16 U/L (ref 0–44)
AST: 23 U/L (ref 15–41)
Albumin: 4.1 g/dL (ref 3.5–5.0)
Alkaline Phosphatase: 86 U/L (ref 38–126)
Anion gap: 14 (ref 5–15)
BUN: 15 mg/dL (ref 8–23)
CO2: 26 mmol/L (ref 22–32)
Calcium: 9.9 mg/dL (ref 8.9–10.3)
Chloride: 102 mmol/L (ref 98–111)
Creatinine, Ser: 1.1 mg/dL — ABNORMAL HIGH (ref 0.44–1.00)
GFR calc Af Amer: 57 mL/min — ABNORMAL LOW (ref >60)
GFR calc non Af Amer: 49 mL/min — ABNORMAL LOW (ref >60)
Glucose, Bld: 90 mg/dL (ref 70–99)
Potassium: 4.3 mmol/L (ref 3.5–5.1)
Sodium: 142 mmol/L (ref 135–145)
Total Bilirubin: 0.9 mg/dL (ref 0.3–1.2)
Total Protein: 7.9 g/dL (ref 6.5–8.1)

## 2020-01-18 LAB — RAPID URINE DRUG SCREEN, HOSP PERFORMED
Amphetamines: NOT DETECTED
Barbiturates: NOT DETECTED
Benzodiazepines: NOT DETECTED
Cocaine: NOT DETECTED
Opiates: NOT DETECTED
Tetrahydrocannabinol: NOT DETECTED

## 2020-01-18 LAB — PROTIME-INR
INR: 1.2 (ref 0.8–1.2)
Prothrombin Time: 14.7 seconds (ref 11.4–15.2)

## 2020-01-18 LAB — URINALYSIS, ROUTINE W REFLEX MICROSCOPIC
Bacteria, UA: NONE SEEN
Bilirubin Urine: NEGATIVE
Glucose, UA: NEGATIVE mg/dL
Ketones, ur: NEGATIVE mg/dL
Leukocytes,Ua: NEGATIVE
Nitrite: NEGATIVE
Protein, ur: NEGATIVE mg/dL
Specific Gravity, Urine: 1.005 (ref 1.005–1.030)
pH: 7 (ref 5.0–8.0)

## 2020-01-18 LAB — SARS CORONAVIRUS 2 BY RT PCR (HOSPITAL ORDER, PERFORMED IN ~~LOC~~ HOSPITAL LAB): SARS Coronavirus 2: NEGATIVE

## 2020-01-18 LAB — ETHANOL: Alcohol, Ethyl (B): <10 mg/dL (ref <10)

## 2020-01-18 LAB — APTT: aPTT: 29 seconds (ref 24–36)

## 2020-01-18 MED ORDER — IOHEXOL 350 MG/ML SOLN
100.0000 mL | Freq: Once | INTRAVENOUS | Status: AC | PRN
  Administered 2020-01-18: 100 mL via INTRAVENOUS

## 2020-01-18 MED ORDER — ACETAMINOPHEN 325 MG PO TABS
650.0000 mg | ORAL_TABLET | Freq: Once | ORAL | Status: AC
  Administered 2020-01-18: 650 mg via ORAL
  Filled 2020-01-18: qty 2

## 2020-01-18 MED ORDER — STROKE: EARLY STAGES OF RECOVERY BOOK
Freq: Once | Status: DC
  Filled 2020-01-18: qty 1

## 2020-01-18 MED ORDER — SODIUM CHLORIDE (PF) 0.9 % IJ SOLN
INTRAMUSCULAR | Status: AC
  Filled 2020-01-18: qty 50

## 2020-01-18 MED ORDER — ACETAMINOPHEN 160 MG/5ML PO SOLN: 650.0000 mg | ORAL | Status: DC | PRN

## 2020-01-18 MED ORDER — ACETAMINOPHEN 650 MG RE SUPP
650.0000 mg | RECTAL | Status: DC | PRN
  Administered 2020-01-20: 650 mg via RECTAL
  Filled 2020-01-18: qty 1

## 2020-01-18 MED ORDER — SENNOSIDES-DOCUSATE SODIUM 8.6-50 MG PO TABS
1.0000 | ORAL_TABLET | Freq: Every evening | ORAL | Status: DC | PRN
  Administered 2020-01-20: 1 via ORAL
  Filled 2020-01-18: qty 1

## 2020-01-18 MED ORDER — SODIUM CHLORIDE 0.9 % IV SOLN: INTRAVENOUS | Status: DC

## 2020-01-18 MED ORDER — ACETAMINOPHEN 325 MG PO TABS
650.0000 mg | ORAL_TABLET | ORAL | Status: DC | PRN
  Filled 2020-01-18: qty 2

## 2020-01-18 NOTE — ED Provider Notes (Signed)
°  Face-to-face evaluation   History: She is here for evaluation of facial asymmetry for 3 days, with a sensation of swelling of the right side of her lips.  Her husband noticed that her left face was droopy, several days ago.  She has chronic disability walking because of a prior stroke and that has not changed.  Today she fell while getting into a car injuring her lower back.  Physical exam: Alert, cooperative, elderly female.  Left facial droop is present.  No dysarthria or aphasia.  No neglect.  No respiratory distress.  Medical screening examination/treatment/procedure(s) were conducted as a shared visit with non-physician practitioner(s) and myself.  I personally evaluated the patient during the encounter    Daleen Bo, MD 01/20/20 [phone]

## 2020-01-18 NOTE — ED Triage Notes (Addendum)
Per EMS- Patient had lip swelling yesterday, but decreased today. Patient's husband was trying to get the patient into the car this AM to come to the ED, but the patient fell. Patient did not hit her head or have LOC. Patient does c/o back pain from the fall.  Patient had a stroke approx 6 months ago and has deficits of the left side.
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
## 2020-01-18 NOTE — H&P (Signed)
History and Physical   NORVELL URESTE MHD:[id number] DOB: 07/11/1945 DOA: 01/09/2020  Referring MD/NP/PA: Dr. Eulis Foster  PCP: Osborne Casco Fransico Him, MD   Outpatient Specialists: Dr. Cyndee Brightly, gastroenterology  Patient coming from: Home  Chief Complaint: Left facial droop  HPI: Andrea Keller is a 74 y.o. female with medical history significant of previous CVA, hypertension, osteoarthritis, hyperplastic colon, insomnia, no ruptured cerebral aneurysm and multiple vasculopathies who presents to the ER with 3 days of facial asymmetry.  The findings were more pronounced on the yesterday.  No change in her speech.  She noticed and family noted left facial droop.  Patient came to the ER where she she was evaluated as a code stroke.  Outside the window for TPA administration.  She denied any focal weakness.  Patient has residual left-sided deficit from previous CVA.  Also patient had a fall today but appears to be mechanical.  Initial evaluation including MRI of the brain showed acute to subacute CVA and patient is therefore being admitted to the hospital for further treatment.  Denied any nausea vomiting or diarrhea denied any shortness of breath.  Denied any trauma..  ED Course: Temperature 97.3 blood pressure 194/77, pulse is 75 respirate 24 oxygen sat 97% room air.  White count is 9.2 hemoglobin 11.0 platelets 95 sodium 142 potassium 4.3 creatinine 1.1 and BUN 15 the rest of the chemistry appeared to be within normal.  X-ray of the lumbar spine showed no acute findings.  MRI of the brain showed about 3-5 punctate foci of abnormal diffusion restriction.  Mainly within the right hemisphere in the watershed distribution.  There is findings of chronic ischemic microangiopathy.  Urinalysis and urine drug screen are both negative.  CT angiogram of the head and neck showed no large vessel occlusion.  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  Patient being admitted to the hospital and  initiated on aspirin Plavix and statin once she passes swallow evaluation.  Review of Systems: As per HPI otherwise 10 point review of systems negative.    Past Medical History:  Diagnosis Date  . Diverticulosis   . 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 Hyperplastic colon polyp   . Hypertension   . Insomnia   . Microcytosis   . Osteoarthritis   . PVD (peripheral vascular disease) ([place])   . Stroke [facility])     Past Surgical History:  Procedure Laterality Date  . ABDOMINAL HYSTERECTOMY  2009  . APPENDECTOMY  10/28/2014  . CYST REMOVAL NECK    . IR GENERIC HISTORICAL  02/20/2016   IR RADIOLOGIST EVAL & MGMT 02/20/2016 [facility]  . IR GENERIC HISTORICAL  06/13/2016   IR RADIOLOGIST EVAL & MGMT 06/13/2016 [facility]  . LAPAROSCOPIC APPENDECTOMY N/A 10/28/2014   Procedure: APPENDECTOMY LAPAROSCOPIC;  Surgeon: Erroll Luna, MD;  Location: [facility];  Service: General;  Laterality: N/A;     reports that she has quit smoking. She has never used smokeless tobacco. She reports that she does not drink alcohol and does not use drugs.  Allergies  Allergen Reactions  . Latex Rash    Family History  Problem Relation Age of Onset  . Diabetes Mother   . Lung cancer Father   . Transient ischemic attack Son   . Colon cancer Brother   . Alcohol abuse Other      Prior to Admission medications   Medication Sig Start Date End Date Taking? Authorizing Provider  acetaminophen (TYLENOL) 325 MG  tablet Take 650 mg by mouth daily as needed for moderate pain.     [provider]  aspirin EC 81 MG tablet Take 1 tablet (81 mg total) by mouth daily. 04/16/16   Rosalin Hawking, MD  cholecalciferol (VITAMIN D3) 25 MCG (1000 UT) tablet Take 5,000 Units by mouth daily.    [provider]  ferrous sulfate 325 (65 FE) MG tablet Take 1 tablet (325 mg total) by mouth daily with breakfast. 09/23/15   Angiulli, Lavon Paganini, PA-C  furosemide (LASIX) 20 MG tablet Take 20 mg by mouth daily.   04/11/16   [provider]  HYDROcodone-acetaminophen (NORCO/VICODIN) 5-325 MG tablet Take 1 tablet by mouth every 6 (six) hours as needed. 10/26/19   Drenda Freeze, MD  lidocaine (LIDODERM) 5 % Place 1 patch onto the skin daily as needed (back pain). Remove & Discard patch within 12 hours or as directed by MD    [provider]  pantoprazole (PROTONIX) 40 MG tablet Take 1 tablet (40 mg total) by mouth daily at 12 noon. 09/23/15   Angiulli, Lavon Paganini, PA-C  predniSONE (DELTASONE) 20 MG tablet Take 60 mg daily x 2 days then 40 mg daily x 2 days then 20 mg daily x 2 days 10/26/19   Drenda Freeze, MD  rosuvastatin (CRESTOR) 10 MG tablet Take 1 tablet (10 mg total) by mouth daily. Patient not taking: Reported on 06/08/2018 09/23/15   Angiulli, Lavon Paganini, PA-C  rosuvastatin (CRESTOR) 20 MG tablet Take 20 mg by mouth daily. 09/17/16   [provider]    Physical Exam: Vitals:   01/24/2020 1201 01/24/2020 1610 01/23/2020 1819 01/20/2020 2107  BP: (!) 145/41 (!) 141/69 (!) 161/78 (!) 194/77  Pulse: 70 63 75 74  Resp: (!) 24 18 20 18   Temp: (!) 97.3 F (36.3 C) 98.2 F (36.8 C)    TempSrc: Oral Oral    SpO2: 99% 97% 99% 100%  Weight:      Height:          Constitutional: Acutely ill looking, left facial droop, no distress Vitals:   12/31/2019 1201 01/24/2020 1610 01/10/2020 1819 01/22/2020 2107  BP: (!) 145/41 (!) 141/69 (!) 161/78 (!) 194/77  Pulse: 70 63 75 74  Resp: (!) 24 18 20 18   Temp: (!) 97.3 F (36.3 C) 98.2 F (36.8 C)    TempSrc: Oral Oral    SpO2: 99% 97% 99% 100%  Weight:      Height:       Eyes: PERRL, lids and conjunctivae normal ENMT: Mucous membranes are moist. Posterior pharynx clear of any exudate or lesions.Normal dentition.  Left facial droop Neck: normal, supple, no masses, no thyromegaly Respiratory: clear to auscultation bilaterally, no wheezing, no crackles. Normal respiratory effort. No accessory muscle use.  Cardiovascular: Regular rate and  rhythm, no murmurs / rubs / gallops. No extremity edema. 2+ pedal pulses. No carotid bruits.  Abdomen: no tenderness, no masses palpated. No hepatosplenomegaly. Bowel sounds positive.  Musculoskeletal: no clubbing / cyanosis. No joint deformity upper and lower extremities. Good ROM, no contractures. Normal muscle tone.  Skin: no rashes, lesions, ulcers. No induration Neurologic: CN 2-12 grossly intact. Sensation intact, DTR normal. Strength 5/5 in RLE 4.  Left facial droop, strength 4 out of 5 on the left upper and lower extremity Psychiatric: Normal judgment and insight. Alert and oriented x 3. Normal mood.     Labs on Admission: I have personally reviewed following labs and imaging studies  CBC: Recent Labs  Lab 01/27/2020 1653  WBC 9.2  NEUTROABS 5.5  HGB 11.1*  HCT 37.6  MCV 79.7*  PLT 95*   Basic Metabolic Panel: Recent Labs  Lab 01/24/2020 1653  NA 142  K 4.3  CL 102  CO2 26  GLUCOSE 90  BUN 15  CREATININE 1.10*  CALCIUM 9.9   GFR: Estimated Creatinine Clearance: 45.3 mL/min (A) (by C-G formula based on SCr of 1.1 mg/dL (H)). Liver Function Tests: Recent Labs  Lab 12/31/2019 1653  AST 23  ALT 16  ALKPHOS 86  BILITOT 0.9  PROT 7.9  ALBUMIN 4.1   No results for input(s): LIPASE, AMYLASE in the last 168 hours. No results for input(s): AMMONIA in the last 168 hours. Coagulation Profile: Recent Labs  Lab 01/29/2020 1653  INR 1.2   Cardiac Enzymes: No results for input(s): CKTOTAL, CKMB, CKMBINDEX, TROPONINI in the last 168 hours. BNP (last 3 results) No results for input(s): PROBNP in the last 8760 hours. HbA1C: No results for input(s): HGBA1C in the last 72 hours. CBG: No results for input(s): GLUCAP in the last 168 hours. Lipid Profile: No results for input(s): CHOL, HDL, LDLCALC, TRIG, CHOLHDL, LDLDIRECT in the last 72 hours. Thyroid Function Tests: No results for input(s): TSH, T4TOTAL, FREET4, T3FREE, THYROIDAB in the last 72 hours. Anemia Panel: No  results for input(s): VITAMINB12, FOLATE, FERRITIN, TIBC, IRON, RETICCTPCT in the last 72 hours. Urine analysis:    Component Value Date/Time   COLORURINE YELLOW 10/26/2019 [place] 10/26/2019 1237   LABSPEC 1.016 10/26/2019 1237   PHURINE 6.0 10/26/2019 1237   GLUCOSEU NEGATIVE 10/26/2019 1237   HGBUR NEGATIVE 10/26/2019 1237   BILIRUBINUR NEGATIVE 10/26/2019 1237   KETONESUR NEGATIVE 10/26/2019 1237   PROTEINUR NEGATIVE 10/26/2019 1237   UROBILINOGEN 0.2 10/28/2014 0830   NITRITE NEGATIVE 10/26/2019 1237   LEUKOCYTESUR NEGATIVE 10/26/2019 1237   Sepsis Labs: @LABRCNTIP (procalcitonin:4,lacticidven:4) )No results found for this or any previous visit (from the past 240 hour(s)).   Radiological Exams on Admission: DG Lumbar Spine Complete  Result Date: 01/16/2020 CLINICAL DATA:  Fall with low back pain EXAM: LUMBAR SPINE - COMPLETE 4+ VIEW COMPARISON:  10/24/2019 FINDINGS: Alignment within normal limits. Vertebral body heights are grossly maintained. Mild degenerative changes most evident at L5-S1. Mild facet degenerative change at the lower lumbar spine. Aortic atherosclerosis. IMPRESSION: No acute osseous abnormality. Electronically Signed   By: Donavan Foil M.D.   On: 01/11/2020 20:20   CT HEAD WO CONTRAST  Result Date: 01/27/2020 CLINICAL DATA:  Fall EXAM: CT HEAD WITHOUT CONTRAST TECHNIQUE: Contiguous axial images were obtained from the base of the skull through the vertex without intravenous contrast. COMPARISON:  CT 06/08/2018 FINDINGS: Brain: Stable regions of encephalomalacia throughout the right frontal parietal and occipital lobe. Few thin cortical calcifications along the right frontal lobe likely reflecting sequela of prior hemorrhage. New areas of cortically based hypoattenuation are seen involving the anterosuperior left frontal lobe. These are age indeterminate, favor chronic though new since December of 2019. No evidence of hemorrhage, hydrocephalus,  extra-axial collection or mass lesion/mass effect. Symmetric prominence of the ventricles, cisterns and sulci compatible with parenchymal volume loss. Patchy areas of white matter hypoattenuation are most compatible with chronic microvascular angiopathy. Vascular: Intracranial atherosclerosis. Redemonstration of the supraclinoid left ICA aneurysm without evidence of acute hemorrhage or rupture measuring up to 9 mm in diameter. Slight dolichoectasia of the right supraclinoid ICA as well. No abnormal hyperdense vessel. Skull: No calvarial fracture or  suspicious osseous lesion. No scalp swelling or hematoma. Sinuses/Orbits: Paranasal sinuses and mastoid air cells are predominantly clear. Included orbital structures are unremarkable. Other: Debris in the external auditory canals. IMPRESSION: 1. New areas of cortically based hypoattenuation involving the anterosuperior left frontal lobe. Age indeterminate though favor chronic though new since December of 2019. If there is concern for acute ischemia, MRI could be obtained for further evaluation. 2. Additional stable regions of encephalomalacia throughout the right frontal parietal and occipital lobe compatible with prior infarct and remote right frontal lobe hemorrhage. 3. Redemonstration of the supraclinoid left ICA aneurysm without evidence of acute hemorrhage or rupture measuring up to 9 mm in diameter. 4. Slight dolichoectasia of the right supraclinoid ICA as well. Electronically Signed   By: Lovena Le M.D.   On: 01/14/2020 18:29   MR BRAIN WO CONTRAST  Result Date: 01/26/2020 CLINICAL DATA:  Left-sided neurologic deficits.  Recent fall. EXAM: MRI HEAD WITHOUT CONTRAST TECHNIQUE: Multiplanar, multiecho pulse sequences of the brain and surrounding structures were obtained without intravenous contrast. COMPARISON:  Head CT 01/21/2020 FINDINGS: Brain: Multiple punctate foci abnormal diffusion restriction, predominantly within the right hemisphere in a watershed  distribution. On the left, there are small foci of diffusion restriction in the left temporal lobe and left frontal white matter. Diffuse confluent hyperintense T2-weighted signal within the periventricular, deep and juxtacortical white matter, most commonly due to chronic ischemic microangiopathy. There are multiple old infarcts within both hemispheres, right worse than left. There is generalized atrophy without lobar predilection. Normal midline structures. Hemosiderin deposition nodes are much of the right hemisphere. No acute hemorrhage. No midline shift or other mass effect. Vascular: There is flow voids are maintained. 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 Skull and upper cervical spine: Normal marrow signal. Sinuses/Orbits: Negative. Other: None IMPRESSION: 1. Multiple (3-5) punctate foci of abnormal diffusion restriction, predominantly within the right hemisphere in a watershed distribution. 2. Findings of chronic ischemic microangiopathy and generalized atrophy with multiple old infarcts. Electronically Signed   By: Ulyses Jarred M.D.   On: 01/27/2020 19:38    EKG: Independently reviewed.  It shows sinus rhythm with a rate of 68, no significant ST changes  Assessment/Plan Principal Problem:   Cerebrovascular accident (CVA) due to embolism of cerebral artery (HCC) Active Problems:   PVD (peripheral vascular disease) ([place])   Hypertension   Essential hypertension   HLD (hyperlipidemia)   Acute CVA (cerebrovascular accident) ([place])     #1 acute to subacute right cerebellar CVA: Residual left-sided weakness but also new facial droop.  MRI done.  We will complete work-up including carotid Doppler ultrasound as well as echocardiogram.  Neurology consulted.  Once patient passes swallow eval will need to be on statin as well as dual platelet therapy.  Defer to neurology.  #2 peripheral vascular disease: Continue home regimen.  #3 hypertension: Blood pressure  appears controlled.  Continue management  #4 hyperlipidemia: Resume statin once patient passes evaluate well.   DVT prophylaxis: Lovenox Code Status: Full code Family Communication: Husband at bedside Disposition Plan: Home Consults called: Dr. Leonel Ramsay, neurology Admission status: Inpatient  Severity of Illness: The appropriate patient status for this patient is INPATIENT. Inpatient status is judged to be reasonable and necessary in order to provide the required intensity of service to ensure the patient's safety. The patient's presenting symptoms, physical exam findings, and initial radiographic and laboratory data in the context of their chronic comorbidities is felt to place them at high risk for further  clinical deterioration. Furthermore, it is not anticipated that the patient will be medically stable for discharge from the hospital within 2 midnights of admission. The following factors support the patient status of inpatient.   " The patient's presenting symptoms include left facial droop. " The worrisome physical exam findings include residual left-sided weakness. " The initial radiographic and laboratory data are worrisome because of MRI showing new CVA. " The chronic co-morbidities include previous CVA.   * I certify that at the point of admission it is my clinical judgment that the patient will require inpatient hospital care spanning beyond 2 midnights from the point of admission due to high intensity of service, high risk for further deterioration and high frequency of surveillance required.Barbette Merino MD [facility] Pager [phone]  If 7PM-7AM, please contact night-coverage [email or web address] Password [facility]  01/10/2020, 9:57 PM

## 2020-01-18 NOTE — ED Notes (Signed)
RN has tried x2 to start an IV without success.

## 2020-01-19 ENCOUNTER — Inpatient Hospital Stay (HOSPITAL_COMMUNITY): Payer: Medicare Other

## 2020-01-19 ENCOUNTER — Encounter (HOSPITAL_COMMUNITY): Payer: Medicare Other

## 2020-01-19 DIAGNOSIS — I351 Nonrheumatic aortic (valve) insufficiency: Secondary | ICD-10-CM

## 2020-01-19 DIAGNOSIS — E785 Hyperlipidemia, unspecified: Secondary | ICD-10-CM

## 2020-01-19 LAB — LIPID PANEL
Cholesterol: 114 mg/dL (ref 0–200)
HDL: 38 mg/dL — ABNORMAL LOW (ref >40)
LDL Cholesterol: 56 mg/dL (ref 0–99)
Total CHOL/HDL Ratio: 3 RATIO
Triglycerides: 99 mg/dL (ref <150)
VLDL: 20 mg/dL (ref 0–40)

## 2020-01-19 LAB — ECHOCARDIOGRAM COMPLETE
Area-P 1/2: 2.6 cm2
Height: 65 in
P 1/2 time: 572 msec
S' Lateral: 2.5 cm
Weight: 2704 oz

## 2020-01-19 LAB — HEMOGLOBIN A1C
Hgb A1c MFr Bld: 6 % — ABNORMAL HIGH (ref 4.8–5.6)
Mean Plasma Glucose: 125.5 mg/dL

## 2020-01-19 MED ORDER — ASPIRIN EC 325 MG PO TBEC
325.0000 mg | DELAYED_RELEASE_TABLET | Freq: Every day | ORAL | Status: DC
  Administered 2020-01-19 – 2020-01-20 (×2): 325 mg via ORAL
  Filled 2020-01-19 (×2): qty 1

## 2020-01-19 MED ORDER — ASPIRIN 300 MG RE SUPP: 300.0000 mg | Freq: Every day | RECTAL | Status: DC

## 2020-01-19 MED ORDER — HALOPERIDOL LACTATE 5 MG/ML IJ SOLN
2.5000 mg | Freq: Once | INTRAMUSCULAR | Status: AC
  Administered 2020-01-19: 2.5 mg via INTRAVENOUS
  Filled 2020-01-19: qty 1

## 2020-01-19 NOTE — Significant Event (Addendum)
HOSPITAL MEDICINE OVERNIGHT EVENT NOTE  Nursing staff called to notify me patient is extremely agitated, trying to be out of bed and limiting plan of care.  Came to evaluate patient at the bedside, patient is visibly agitated, oriented x2.  Patient intermittently following commands, moving all 4 extremities spontaneously.  No clinical evidence of new neurologic deficit compared to prior evaluations.  We will try one-time dose of Haldol 2.5 mg IV x1.  Will obtain EKG to evaluate QT interval, monitor patient on telemetry.  Sherryll Burger Aaliyah Cancro   ADDENDUM 2:40AM  Nursing states that patient is now complaining of a severe headache.  No evidence of focal neurologic deficit however.  Tylenol given, if headache does not quickly abate will repeat scan of head.  Sherryll Burger Arrielle Mcginn

## 2020-01-19 NOTE — Progress Notes (Signed)
   01/19/20 2056  Assess: MEWS Score  Temp 98 F (36.7 C)  BP (!) 142/85  Pulse Rate 88  ECG Heart Rate 88  Resp 18  Level of Consciousness Alert  SpO2 98 %  O2 Device Room Air  Patient Activity (if Appropriate) In bed  Assess: MEWS Score  MEWS Temp 0  MEWS Systolic 0  MEWS Pulse 0  MEWS RR 0  MEWS LOC 0  MEWS Score 0  MEWS Score Color Green  Assess: if the MEWS score is Yellow or Red  Were vital signs taken at a resting state? Yes  Focused Assessment No change from prior assessment  Early Detection of Sepsis Score *See Row Information* Low  MEWS guidelines implemented *See Row Information* No, other (Comment) (no acute changes)  Treat  Pain Scale 0-10  Pain Score 0  Document  Progress note created (see row info) Yes

## 2020-01-19 NOTE — ED Notes (Signed)
Pt in room with husband. Denies any needs at this time. Updated on plan of care.

## 2020-01-19 NOTE — Progress Notes (Signed)
PT Cancellation Note  Patient Details Name: Andrea Keller MRN: [id number] DOB: 10/14/1945   Cancelled Treatment:    Reason Eval/Treat Not Completed: Medical issues which prohibited therapy  Patient's BP 189/64. Noted goal for SBP under 180. As activity would further increase her BP, will defer evaluation at this time.    [name], PT Pager [phone]  [name] 01/19/2020, 3:54 PM

## 2020-01-19 NOTE — Progress Notes (Signed)
Echocardiogram 2D Echocardiogram has been performed.  Oneal Deputy Alicea Wente 01/19/2020, 8:44 AM

## 2020-01-19 NOTE — Progress Notes (Signed)
PROGRESS NOTE    Andrea Keller  ERX:[id number] DOB: 27-Sep-1945 DOA: 01/14/2020 PCP: Haywood Pao, MD    Brief Narrative: 74 y.o. female with medical history significant of previous CVA, hypertension, osteoarthritis, hyperplastic colon, insomnia, no ruptured cerebral aneurysm and multiple vasculopathies who presents to the ER with 3 days of facial asymmetry.  The findings were more pronounced on the yesterday.  No change in her speech.  She noticed and family noted left facial droop.  Patient came to the ER where she she was evaluated as a code stroke.  Outside the window for TPA administration.  She denied any focal weakness.  Patient has residual left-sided deficit from previous CVA.  Also patient had a fall today but appears to be mechanical.  Initial evaluation including MRI of the brain showed acute to subacute CVA and patient is therefore being admitted to the hospital for further treatment.  Denied any nausea vomiting or diarrhea denied any shortness of breath.  Denied any trauma..  ED Course: Temperature 97.3 blood pressure 194/77, pulse is 75 respirate 24 oxygen sat 97% room air.  White count is 9.2 hemoglobin 11.0 platelets 95 sodium 142 potassium 4.3 creatinine 1.1 and BUN 15 the rest of the chemistry appeared to be within normal.  X-ray of the lumbar spine showed no acute findings.  MRI of the brain showed about 3-5 punctate foci of abnormal diffusion restriction.  Mainly within the right hemisphere in the watershed distribution.  There is findings of chronic ischemic microangiopathy.  Urinalysis and urine drug screen are both negative.  CT angiogram of the head and neck showed no large vessel occlusion.  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  Patient being admitted to the hospital and initiated on aspirin Plavix and statin once she passes swallow evaluation.  Assessment & Plan:   Principal Problem:   Cerebrovascular accident (CVA) due to embolism of  cerebral artery ([place]) Active Problems:   PVD (peripheral vascular disease) ([place])   Hypertension   Essential hypertension   HLD (hyperlipidemia)   Acute CVA (cerebrovascular accident) ([place])   #1 acute stroke patient with history of multiple strokes in the past with residual left-sided weakness.  However she presents with new left facial droop.  Patient was transferred to [facility] for neurology work-up.  Seen by neurology. PT OT and speech therapy pending. LDL 56, A1c 6.0 urine drug screen negative Echocardiogram left ventricular ejection fraction 65 to 70%.  No wall motion abnormalities.  Mild mitral valve regurgitation no evidence of mitral stenosis.  Moderately calcified aortic valve. CT angiogram of the head and neck no emergent large vessel occlusion or high-grade stenosis of the intracranial arteries.  Rate narrowing of the left vertebral artery V4 segment due to atherosclerotic calcification.  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 MRI brain multiple 3-5 punctate foci of abnormal diffusion restriction predominantly within the right hemisphere in the watershed distribution.  Findings of chronic ischemic microangiopathy generalized atrophy with multiple old infarcts.  Once cleared by speech therapy continue aspirin 325 mg daily.  #2 hyperlipidemia on Crestor 20 mg daily at home.    Estimated body mass index is 28.12 kg/m as calculated from the following:   Height as of this encounter: 5\' 5"  (1.651 m).   Weight as of this encounter: 76.7 kg.  DVT prophylaxis: Lovenox Code Status: Full code Family Communication: Discussed with husband at the bedside Disposition Plan:  Status  is: Inpatient  Dispo: The patient is from: Home              Anticipated d/c is to: Unknown PT and OT and speech therapy is pending              Anticipated d/c date is: 1 to 2 days              Patient currently is not medically stable to d/c.  Patient  admitted with new acute stroke with history of multiple strokes in the past neurology work-up is pending PT OT speech eval is pending    Consultants:   Neurology  Procedures: None Antimicrobials: None  Subjective: Patient resting in bed husband by the bedside she reports she is fine she reports her swallowing is fine her speech is fine however she does have left upper and lower extremity weakness and left facial droop.  Objective: Vitals:   01/19/20 0400 01/19/20 0441 01/19/20 0600 01/19/20 0756  BP:  (!) 126/56 (!) 144/60 (!) 159/66  Pulse: 73 (!) 58 62 64  Resp: 16 20 20 16   Temp:  97.7 F (36.5 C)  98.2 F (36.8 C)  TempSrc:    Oral  SpO2: 99% 93% 97% 100%  Weight:      Height:       No intake or output data in the 24 hours ending 01/19/20 1049 Filed Weights   01/11/2020 0757  Weight: 76.7 kg    Examination:  General exam: Appears calm and comfortable  Respiratory system: Clear to auscultation. Respiratory effort normal. Cardiovascular system: S1 & S2 heard, RRR. No JVD, murmurs, rubs, gallops or clicks. No pedal edema. Gastrointestinal system: Abdomen is nondistended, soft and nontender. No organomegaly or masses felt. Normal bowel sounds heard. Central nervous system: Awake oriented to hospital 3 x 5 to left upper upper extremity and left lower extremity.  Left facial droop noted. extremities: Symmetric 5 x 5 power. Skin: No rashes, lesions or ulcers Psychiatry: Judgement and insight appear normal. Mood & affect appropriate.     Data Reviewed: I have personally reviewed following labs and imaging studies  CBC: Recent Labs  Lab 01/19/2020 1653  WBC 9.2  NEUTROABS 5.5  HGB 11.1*  HCT 37.6  MCV 79.7*  PLT 95*   Basic Metabolic Panel: Recent Labs  Lab 01/13/2020 1653  NA 142  K 4.3  CL 102  CO2 26  GLUCOSE 90  BUN 15  CREATININE 1.10*  CALCIUM 9.9   GFR: Estimated Creatinine Clearance: 45.3 mL/min (A) (by C-G formula based on SCr of 1.1 mg/dL  (H)). Liver Function Tests: Recent Labs  Lab 01/26/2020 1653  AST 23  ALT 16  ALKPHOS 86  BILITOT 0.9  PROT 7.9  ALBUMIN 4.1   No results for input(s): LIPASE, AMYLASE in the last 168 hours. No results for input(s): AMMONIA in the last 168 hours. Coagulation Profile: Recent Labs  Lab 01/07/2020 1653  INR 1.2   Cardiac Enzymes: No results for input(s): CKTOTAL, CKMB, CKMBINDEX, TROPONINI in the last 168 hours. BNP (last 3 results) No results for input(s): PROBNP in the last 8760 hours. HbA1C: Recent Labs    01/19/20 0445  HGBA1C 6.0*   CBG: No results for input(s): GLUCAP in the last 168 hours. Lipid Profile: Recent Labs    01/19/20 0445  CHOL 114  HDL 38*  LDLCALC 56  TRIG 99  CHOLHDL 3.0   Thyroid Function Tests: No results for input(s): TSH, T4TOTAL, FREET4, T3FREE, THYROIDAB in  the last 72 hours. Anemia Panel: No results for input(s): VITAMINB12, FOLATE, FERRITIN, TIBC, IRON, RETICCTPCT in the last 72 hours. Sepsis Labs: No results for input(s): PROCALCITON, LATICACIDVEN in the last 168 hours.  Recent Results (from the past 240 hour(s))  SARS Coronavirus 2 by RT PCR (hospital order, performed in [facility] hospital lab) Nasopharyngeal Nasopharyngeal Swab     Status: None   Collection Time: 01/11/2020  9:02 PM   Specimen: Nasopharyngeal Swab  Result Value Ref Range Status   SARS Coronavirus 2 NEGATIVE NEGATIVE Final    Comment: (NOTE) SARS-CoV-2 target nucleic acids are NOT DETECTED.  The SARS-CoV-2 RNA is generally detectable in upper and lower respiratory specimens during the acute phase of infection. The lowest concentration of SARS-CoV-2 viral copies this assay can detect is 250 copies / mL. A negative result does not preclude SARS-CoV-2 infection and should not be used as the sole basis for treatment or other patient management decisions.  A negative result may occur with improper specimen collection / handling, submission of specimen other than  nasopharyngeal swab, presence of viral mutation(s) within the areas targeted by this assay, and inadequate number of viral copies (<250 copies / mL). A negative result must be combined with clinical observations, patient history, and epidemiological information.  Fact Sheet for Patients:   StrictlyIdeas.no  Fact Sheet for Healthcare Providers: BankingDealers.co.za  This test is not yet approved or  cleared by the [place] FDA and has been authorized for detection and/or diagnosis of SARS-CoV-2 by FDA under an Emergency Use Authorization (EUA).  This EUA will remain in effect (meaning this test can be used) for the duration of the COVID-19 declaration under Section 564(b)(1) of the Act, 21 U.S.C. section 360bbb-3(b)(1), unless the authorization is terminated or revoked sooner.  Performed at [facility], [place] [address], [place], [place] [address]          Radiology Studies: CT Angio Head W or Wo Contrast  Result Date: 01/17/2020 CLINICAL DATA:  Fall, left-sided deficit. EXAM: CT ANGIOGRAPHY HEAD AND NECK TECHNIQUE: Multidetector CT imaging of the head and neck was performed using the standard protocol during bolus administration of intravenous contrast. Multiplanar CT image reconstructions and MIPs were obtained to evaluate the vascular anatomy. Carotid stenosis measurements (when applicable) are obtained utilizing NASCET criteria, using the distal internal carotid diameter as the denominator. CONTRAST:  121mL OMNIPAQUE IOHEXOL 350 MG/ML SOLN COMPARISON:  None. FINDINGS: CTA NECK FINDINGS SKELETON: There is no bony spinal canal stenosis. No lytic or blastic lesion. OTHER NECK: Normal pharynx, larynx and major salivary glands. No cervical lymphadenopathy. Unremarkable thyroid gland. UPPER CHEST: No pneumothorax or pleural effusion. No nodules or masses. AORTIC ARCH: There is mild calcific atherosclerosis of the aortic  arch. There is no aneurysm, dissection or hemodynamically significant stenosis of the visualized portion of the aorta. Conventional 3 vessel aortic branching pattern. The visualized proximal subclavian arteries are widely patent. RIGHT CAROTID SYSTEM: Normal without aneurysm, dissection or stenosis. LEFT CAROTID SYSTEM: No dissection, occlusion or aneurysm. Mild atherosclerotic calcification at the carotid bifurcation without hemodynamically significant stenosis. VERTEBRAL ARTERIES: Right dominant configuration. Both origins are clearly patent. There is no dissection, occlusion or flow-limiting stenosis to the skull base (V1-V3 segments). CTA HEAD FINDINGS POSTERIOR CIRCULATION: --Vertebral arteries: Moderate narrowing of the left V4 segment due to atherosclerotic calcification. Normal right V4 segment. --Inferior cerebellar arteries: Normal. --Basilar artery: Normal. --Superior cerebellar arteries: Normal. --Posterior cerebral arteries (PCA): Normal. ANTERIOR CIRCULATION: --Intracranial internal carotid arteries: Fusiform aneurysm  of the left carotid terminus measures 7 mm. --Anterior cerebral arteries (ACA): Normal. Both A1 segments are present. Patent anterior communicating artery (a-comm). --Middle cerebral arteries (MCA): Normal. VENOUS SINUSES: As permitted by contrast timing, patent. ANATOMIC VARIANTS: Fetal origins of both posterior cerebral arteries. Review of the MIP images confirms the above findings. IMPRESSION: 1. No emergent large vessel occlusion or high-grade stenosis of the intracranial arteries. 2. 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 3. Moderate narrowing of the left vertebral artery V4 segment due to atherosclerotic calcification. 4. Aortic Atherosclerosis (ICD10-I70.0). Electronically Signed   By: Ulyses Jarred M.D.   On: 01/08/2020 23:24   DG Lumbar Spine Complete  Result Date: 01/01/2020 CLINICAL DATA:  Fall with low back pain EXAM: LUMBAR SPINE - COMPLETE 4+ VIEW  COMPARISON:  10/24/2019 FINDINGS: Alignment within normal limits. Vertebral body heights are grossly maintained. Mild degenerative changes most evident at L5-S1. Mild facet degenerative change at the lower lumbar spine. Aortic atherosclerosis. IMPRESSION: No acute osseous abnormality. Electronically Signed   By: Donavan Foil M.D.   On: 01/16/2020 20:20   CT HEAD WO CONTRAST  Result Date: 01/12/2020 CLINICAL DATA:  Fall EXAM: CT HEAD WITHOUT CONTRAST TECHNIQUE: Contiguous axial images were obtained from the base of the skull through the vertex without intravenous contrast. COMPARISON:  CT 06/08/2018 FINDINGS: Brain: Stable regions of encephalomalacia throughout the right frontal parietal and occipital lobe. Few thin cortical calcifications along the right frontal lobe likely reflecting sequela of prior hemorrhage. New areas of cortically based hypoattenuation are seen involving the anterosuperior left frontal lobe. These are age indeterminate, favor chronic though new since December of 2019. No evidence of hemorrhage, hydrocephalus, extra-axial collection or mass lesion/mass effect. Symmetric prominence of the ventricles, cisterns and sulci compatible with parenchymal volume loss. Patchy areas of white matter hypoattenuation are most compatible with chronic microvascular angiopathy. Vascular: Intracranial atherosclerosis. Redemonstration of the supraclinoid left ICA aneurysm without evidence of acute hemorrhage or rupture measuring up to 9 mm in diameter. Slight dolichoectasia of the right supraclinoid ICA as well. No abnormal hyperdense vessel. Skull: No calvarial fracture or suspicious osseous lesion. No scalp swelling or hematoma. Sinuses/Orbits: Paranasal sinuses and mastoid air cells are predominantly clear. Included orbital structures are unremarkable. Other: Debris in the external auditory canals. IMPRESSION: 1. New areas of cortically based hypoattenuation involving the anterosuperior left frontal lobe.  Age indeterminate though favor chronic though new since December of 2019. If there is concern for acute ischemia, MRI could be obtained for further evaluation. 2. Additional stable regions of encephalomalacia throughout the right frontal parietal and occipital lobe compatible with prior infarct and remote right frontal lobe hemorrhage. 3. Redemonstration of the supraclinoid left ICA aneurysm without evidence of acute hemorrhage or rupture measuring up to 9 mm in diameter. 4. Slight dolichoectasia of the right supraclinoid ICA as well. Electronically Signed   By: Lovena Le M.D.   On: 01/04/2020 18:29   CT Angio Neck W and/or Wo Contrast  Result Date: 01/04/2020 CLINICAL DATA:  Fall, left-sided deficit. EXAM: CT ANGIOGRAPHY HEAD AND NECK TECHNIQUE: Multidetector CT imaging of the head and neck was performed using the standard protocol during bolus administration of intravenous contrast. Multiplanar CT image reconstructions and MIPs were obtained to evaluate the vascular anatomy. Carotid stenosis measurements (when applicable) are obtained utilizing NASCET criteria, using the distal internal carotid diameter as the denominator. CONTRAST:  159mL OMNIPAQUE IOHEXOL 350 MG/ML SOLN COMPARISON:  None. FINDINGS: CTA NECK FINDINGS SKELETON: There is no  bony spinal canal stenosis. No lytic or blastic lesion. OTHER NECK: Normal pharynx, larynx and major salivary glands. No cervical lymphadenopathy. Unremarkable thyroid gland. UPPER CHEST: No pneumothorax or pleural effusion. No nodules or masses. AORTIC ARCH: There is mild calcific atherosclerosis of the aortic arch. There is no aneurysm, dissection or hemodynamically significant stenosis of the visualized portion of the aorta. Conventional 3 vessel aortic branching pattern. The visualized proximal subclavian arteries are widely patent. RIGHT CAROTID SYSTEM: Normal without aneurysm, dissection or stenosis. LEFT CAROTID SYSTEM: No dissection, occlusion or aneurysm. Mild  atherosclerotic calcification at the carotid bifurcation without hemodynamically significant stenosis. VERTEBRAL ARTERIES: Right dominant configuration. Both origins are clearly patent. There is no dissection, occlusion or flow-limiting stenosis to the skull base (V1-V3 segments). CTA HEAD FINDINGS POSTERIOR CIRCULATION: --Vertebral arteries: Moderate narrowing of the left V4 segment due to atherosclerotic calcification. Normal right V4 segment. --Inferior cerebellar arteries: Normal. --Basilar artery: Normal. --Superior cerebellar arteries: Normal. --Posterior cerebral arteries (PCA): Normal. 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 --Anterior cerebral arteries (ACA): Normal. Both A1 segments are present. Patent anterior communicating artery (a-comm). --Middle cerebral arteries (MCA): Normal. VENOUS SINUSES: As permitted by contrast timing, patent. ANATOMIC VARIANTS: Fetal origins of both posterior cerebral arteries. Review of the MIP images confirms the above findings. IMPRESSION: 1. No emergent large vessel occlusion or high-grade stenosis of the intracranial arteries. 2. 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 3. Moderate narrowing of the left vertebral artery V4 segment due to atherosclerotic calcification. 4. Aortic Atherosclerosis (ICD10-I70.0). Electronically Signed   By: Ulyses Jarred M.D.   On: 01/09/2020 23:24   MR BRAIN WO CONTRAST  Result Date: 01/20/2020 CLINICAL DATA:  Left-sided neurologic deficits.  Recent fall. EXAM: MRI HEAD WITHOUT CONTRAST TECHNIQUE: Multiplanar, multiecho pulse sequences of the brain and surrounding structures were obtained without intravenous contrast. COMPARISON:  Head CT 01/13/2020 FINDINGS: Brain: Multiple punctate foci abnormal diffusion restriction, predominantly within the right hemisphere in a watershed distribution. On the left, there are small foci of  diffusion restriction in the left temporal lobe and left frontal white matter. Diffuse confluent hyperintense T2-weighted signal within the periventricular, deep and juxtacortical white matter, most commonly due to chronic ischemic microangiopathy. There are multiple old infarcts within both hemispheres, right worse than left. There is generalized atrophy without lobar predilection. Normal midline structures. Hemosiderin deposition nodes are much of the right hemisphere. No acute hemorrhage. No midline shift or other mass effect. Vascular: There is flow voids are maintained. 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 Skull and upper cervical spine: Normal marrow signal. Sinuses/Orbits: Negative. Other: None IMPRESSION: 1. Multiple (3-5) punctate foci of abnormal diffusion restriction, predominantly within the right hemisphere in a watershed distribution. 2. Findings of chronic ischemic microangiopathy and generalized atrophy with multiple old infarcts. Electronically Signed   By: Ulyses Jarred M.D.   On: 01/22/2020 19:38        Scheduled Meds: .  stroke: mapping our early stages of recovery book   Does not apply Once  . aspirin EC  325 mg Oral Daily   Or  . aspirin  300 mg Rectal Daily   Continuous Infusions: . sodium chloride 125 mL/hr at 01/19/20 1044     LOS: 1 day     Georgette Shell, MD 01/19/2020, 10:49 AM

## 2020-01-19 NOTE — ED Notes (Signed)
purewick and chux changed, pt repositioned in bed

## 2020-01-19 NOTE — Progress Notes (Signed)
ASHANTI RATTI [id number] Admission Data: 01/19/2020 11:32 AM Attending Provider: Oren Binet*  HRC:BULAGTX, Fransico Him, MD Consults/ Treatment Team: Treatment Team:  Catarina Hartshorn, MD  Verlene FRONIE HOLSTEIN is a 73 y.o. female patient admitted from ED awake, alert  & orientated  X 3,  Full Code, VSS - Blood pressure (!) 175/88, pulse 64, temperature 98.3 F (36.8 C), temperature source Axillary, resp. rate 16, height 5\' 5"  (1.651 m), weight 68.3 kg, SpO2 100 %., O2  no c/o shortness of breath, no c/o chest pain, no distress noted. Tele # 12 placed and pt is currently running:normal sinus rhythm.   IV site WDL:  upper arm left, condition patent and no redness with a transparent dsg that's clean dry and intact.  Allergies:  Pt orientation to unit, room and routine. Information packet given to patient/family and safety video watched.  Admission INP armband ID verified with patient/family, and in place. SR up x 2, fall risk assessment complete with Patient and family verbalizing understanding of risks associated with falls. Pt verbalizes an understanding of how to use the call bell and to call for help before getting out of bed.  Skin, clean-dry- intact without evidence of bruising, or skin tears.   No evidence of skin break down noted on exam.    Will cont to monitor and assist as needed.  Anamae Rochelle Shelda Pal, RN 01/19/2020 11:32 AM

## 2020-01-19 NOTE — ED Notes (Signed)
Report attempted x1 at [facility].

## 2020-01-19 NOTE — Progress Notes (Signed)
PT Cancellation Note  Patient Details Name: ELICA ALMAS MRN: [id number] DOB: Jan 15, 1946   Cancelled Treatment:    Reason Eval/Treat Not Completed: Other (comment)Patient  Transferred to [facility]. Will follow.    Claretha Cooper 01/19/2020, 10:47 AM [place] Pager [phone] Office [phone]

## 2020-01-19 NOTE — Consult Note (Signed)
Neurology Consultation Reason for Consult: Stroke Referring Physician: Jonelle Sidle, M  CC: Increased facial weakness  History is obtained from:patient, chart  HPI: Andrea Keller is a 74 y.o. female with a history of multiple previous strokes including what was likely a hemorrhagic infarct in 2017 who presents with increased facial weakness.  She first noticed it on Sunday.  Due to persistence of symptoms, she then sought care in the [facility] emergency department.  An MRI was performed, which demonstrates multifocal areas of cortical ischemia.    LKW: 7/17 prior to bed tpa given?: no, outside window    ROS: A 14 point ROS was performed and is negative except as noted in the HPI.   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 She has never used smokeless tobacco. She reports that she does not drink alcohol and does not use drugs.   Exam: Current vital signs: BP (!) 126/56    Pulse (!) 58    Temp 97.7 F (36.5 C)    Resp 20    Ht 5\' 5"  (1.651 m)    Wt 76.7 kg    SpO2 93%    BMI 28.12 kg/m  Vital signs in last 24 hours: Temp:  [97.3 F (36.3 C)-98.2 F (36.8 C)] 97.7 F (36.5 C) (07/20 0441) Pulse Rate:  [58-75] 58 (07/20 0441) Resp:  [11-24] 20 (07/20 0441) BP: (126-194)/(41-102) 126/56 (07/20 0441) SpO2:  [93 %-100 %] 93 % (07/20 0441) Weight:  [76.7 kg] 76.7 kg (07/19 0757)   Physical Exam  Constitutional: Appears well-developed and well-nourished.  Psych: Affect appropriate to situation Eyes: No scleral injection HENT:  No OP obstrucion MSK: no joint deformities.  Cardiovascular: Normal rate and regular rhythm.  Respiratory: Effort normal, non-labored breathing GI: Soft.  No distension. There is no tenderness.  Skin: WDI  Neuro: Mental Status: Patient is awake, alert, oriented to person, place,  year, and situation. Gives month as april, but when I ask about recent holidays, corrects herself to July.  Patient is able to give a clear and coherent history. No signs of aphasia or neglect Cranial Nerves: II: Counts fingers in all visual fields. Pupils are equal, round, and reactive to light.   III,IV, VI: EOMI without ptosis or diploplia.  V: Facial sensation is symmetric to temperature VII: Facial movement with left facial weakness.   VIII: hearing is intact to voice X: Uvula elevates symmetrically XI: Shoulder shrug is symmetric. XII: tongue is midline without atrophy or fasciculations.  Motor: Tone is normal. Bulk is normal. 5/5 strength was present on the right. On the left, she has a mild left arm > leg spastic paresis.  Sensory: Sensation is symmetric to light touch and temperature in the arms and legs. Cerebellar: FNF intact on right, consistent with weakness on left.     I have reviewed labs in epic and the results pertinent to this consultation are: LDL 56 UDS  negative   I have reviewed the images obtained: MRI brain-multifocal predominantly cortical areas of punctate ischemia, concerning for cardioembolic versus watershed.  Previous MRI from March 2017 reviewed with more clearly cardioembolic distribution.  Impression: 74 year old female with new multifocal areas of acute ischemia.  Though there is a possibility that it could be in a watershed distribution, with her history of cardioembolic appearing phenomena, I would favor a thorough cardiac evaluation as well.  Recommendations: - HgbA1c, fasting lipid panel - Frequent neuro checks - Echocardiogram - Prophylactic  therapy-Antiplatelet med: Aspirin - 325mg  daily - Risk factor modification - Telemetry monitoring - PT consult, OT consult, Speech consult - Stroke team to follow   Roland Rack, MD [facility] [phone]  If 7pm- 7am, please page neurology on call as listed in [place].

## 2020-01-19 NOTE — ED Notes (Signed)
Spoke with pharmacy regarding stroke education book, states it is not needed at this time and will be addressed once patient is transferred to [facility].

## 2020-01-19 NOTE — ED Notes (Signed)
Pts husband took belongings home

## 2020-01-20 DIAGNOSIS — I10 Essential (primary) hypertension: Secondary | ICD-10-CM

## 2020-01-20 DIAGNOSIS — I6930 Unspecified sequelae of cerebral infarction: Secondary | ICD-10-CM | POA: Diagnosis not present

## 2020-01-20 DIAGNOSIS — R7303 Prediabetes: Secondary | ICD-10-CM

## 2020-01-20 DIAGNOSIS — D696 Thrombocytopenia, unspecified: Secondary | ICD-10-CM

## 2020-01-20 DIAGNOSIS — E782 Mixed hyperlipidemia: Secondary | ICD-10-CM

## 2020-01-20 DIAGNOSIS — I6340 Cerebral infarction due to embolism of unspecified cerebral artery: Secondary | ICD-10-CM

## 2020-01-20 MED ORDER — ASPIRIN EC 81 MG PO TBEC
81.0000 mg | DELAYED_RELEASE_TABLET | Freq: Every day | ORAL | Status: DC
  Administered 2020-01-21 – 2020-01-23 (×3): 81 mg via ORAL
  Filled 2020-01-20 (×3): qty 1

## 2020-01-20 MED ORDER — CLOPIDOGREL BISULFATE 75 MG PO TABS
75.0000 mg | ORAL_TABLET | Freq: Every day | ORAL | Status: DC
  Administered 2020-01-20 – 2020-01-23 (×4): 75 mg via ORAL
  Filled 2020-01-20 (×4): qty 1

## 2020-01-20 MED ORDER — ROSUVASTATIN CALCIUM 5 MG PO TABS
20.0000 mg | ORAL_TABLET | Freq: Every day | ORAL | Status: DC
  Administered 2020-01-20 – 2020-01-23 (×4): 20 mg via ORAL
  Filled 2020-01-20 (×4): qty 1

## 2020-01-20 MED ORDER — ENOXAPARIN SODIUM 40 MG/0.4ML ~~LOC~~ SOLN
40.0000 mg | SUBCUTANEOUS | Status: DC
  Administered 2020-01-20 – 2020-01-23 (×4): 40 mg via SUBCUTANEOUS
  Filled 2020-01-20 (×4): qty 0.4

## 2020-01-20 NOTE — Progress Notes (Signed)
Occupational Therapy Evaluation Patient Details Name: Andrea Keller MRN: [id number] DOB: Jul 10, 1945 Today's Date: 01/20/2020    History of Present Illness 74 y.o. 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 She presented to [facility] ED 01/24/2020 after several days of symptoms. An MRI was performed, which demonstrates multifocal areas of cortical ischemia. PMHx- HTN, OA, PVD, CVAs   Clinical Impression   Patient lives with husband and son.  According to case worker's note husband was helping with all ADLs and patient was able to walk independently prior to admission.  Patient not able to give history due to poor STM, awareness, attention.  Patient following one step commands inconsistently, even with verbal and tactile cueing.  L UE presenting in a flexed position with increased spasticity.  She did have 2-/5 strength in shoulder and 2/5 in hand/wrist.  Requiring max assist with UB ADLs and total with LB at this time.  Appears to have some L inattention and possible L field cut (difficult to tell as patient not following commands).  Recommending CIR as patient shows good potential and family involvement.  Will continue to follow with OT acutely to address the deficits listed below.       Follow Up Recommendations  CIR;Supervision/Assistance - 24 hour (pending family assist and more accurate PLOF)    Equipment Recommendations  Other (comment) (defer to next venue)    Recommendations for Other Services       Precautions / Restrictions Precautions Precautions: Fall;Other (comment) Precaution Comments: goal SBP <180 Restrictions Weight Bearing Restrictions: No      Mobility Bed Mobility Overal bed mobility: Needs Assistance Bed Mobility: Rolling Rolling: Min assist         General bed mobility comments: Able to bridge briefly  Transfers                  General transfer comment: did not attempt this session    Balance Overall balance assessment: Needs assistance                                         ADL either performed or assessed with clinical judgement   ADL Overall ADL's : Needs assistance/impaired Eating/Feeding: Maximal assistance   Grooming: Maximal assistance;Sitting   Upper Body Bathing: Maximal assistance;Sitting   Lower Body Bathing: Total assistance;Bed level   Upper Body Dressing : Maximal assistance;Sitting   Lower Body Dressing: Total assistance;Bed level   Toilet Transfer: Total assistance;+2 for physical assistance           Functional mobility during ADLs: Moderate assistance (bed mobility)       Vision   Vision Assessment?: Vision impaired- to be further tested in functional context Additional Comments: Patient not able to follow commands to complete formal vision assessment..  Having increased difficulty with left gaze, there is a R preference.  Possible L field cut, some neglect to left side.      Perception     Praxis      Pertinent Vitals/Pain Pain Assessment: No/denies pain     Hand Dominance Right   Extremity/Trunk Assessment Upper Extremity Assessment Upper Extremity Assessment: LUE deficits/detail;Generalized weakness LUE Deficits / Details: Patient with 2-/5 strength in shoulder, biceps. Hand/finger flexion 2/5.  Patient resting in flexed posture with increased spasticity.  Light  touch impaired "feels different". SOme inattention to L  LUE Sensation: decreased light touch;decreased proprioception LUE Coordination: decreased fine motor;decreased gross motor   Lower Extremity Assessment Lower Extremity Assessment: Defer to PT evaluation       Communication Communication Communication: Expressive difficulties (some slurring of speech)   Cognition Arousal/Alertness: Awake/alert Behavior During Therapy: WFL for tasks  assessed/performed Overall Cognitive Status: No family/caregiver present to determine baseline cognitive functioning Area of Impairment: Orientation;Attention;Memory;Following commands;Awareness;Problem solving                 Orientation Level: Disoriented to;Situation;Time (July 14 ) Current Attention Level: Sustained Memory: Decreased short-term memory Following Commands: Follows one step commands inconsistently;Follows one step commands with increased time   Awareness: Intellectual Problem Solving: Slow processing;Difficulty sequencing;Requires verbal cues;Requires tactile cues General Comments: Not aware of deficits.  Reaching R arm up to touch therapists face consistently during session.  She is very sweet but confused.  Poor motor planning.  Not able to move arm when asked but could spontaneously    General Comments  BP 173/74     Exercises Exercises: General Upper Extremity General Exercises - Upper Extremity Shoulder Flexion: PROM;AROM;Left;5 reps Elbow Extension: PROM;Left;5 reps   Shoulder Instructions      Home Living Family/patient expects to be discharged to:: Private residence Living Arrangements: Spouse/significant other;Children (son) Available Help at Discharge: Family;Available PRN/intermittently (son goes to dialysis; husband runs errands) Type of Home: House Home Access: Stairs to enter [employer] of Steps: 3 Entrance Stairs-Rails: None Home Layout: One level     Bathroom Shower/Tub: Teacher, early years/pre: Standard Bathroom Accessibility: Yes   Home Equipment: Environmental consultant - 2 wheels;Kasandra Knudsen - single point   Additional Comments: Pt confused; information from 2017 admission  Lives With: Spouse;Son    Prior Functioning/Environment Level of Independence: Needs assistance  Gait / Transfers Assistance Needed: Ambulatory w/cane ADL's / Homemaking Assistance Needed: Husband helps with all ADLs   Comments: PLOF according to case  worker's discussion with husband        OT Problem List: Decreased strength;Decreased range of motion;Decreased activity tolerance;Impaired balance (sitting and/or standing);Impaired vision/perception;Decreased coordination;Decreased cognition;Decreased safety awareness;Decreased knowledge of use of DME or AE;Impaired sensation;Impaired tone;Impaired UE functional use      OT Treatment/Interventions: Self-care/ADL training;Therapeutic exercise;Neuromuscular education;Energy conservation;DME and/or AE instruction;Therapeutic activities;Cognitive remediation/compensation;Visual/perceptual remediation/compensation;Patient/family education;Balance training    OT Goals(Current goals can be found in the care plan section) Acute Rehab OT Goals Patient Stated Goal: to walk OT Goal Formulation: With patient Time For Goal Achievement: 02/03/20 Potential to Achieve Goals: Good  OT Frequency: Min 2X/week   Barriers to D/C:            Co-evaluation              AM-PAC OT "6 Clicks" Daily Activity     Outcome Measure Help from another person eating meals?: A Lot Help from another person taking care of personal grooming?: A Lot Help from another person toileting, which includes using toliet, bedpan, or urinal?: Total Help from another person bathing (including washing, rinsing, drying)?: Total Help from another person to put on and taking off regular upper body clothing?: A Lot Help from another person to put on and taking off regular lower body clothing?: Total 6 Click Score: 9   End of Session Nurse Communication: Mobility status  Activity Tolerance: Patient tolerated treatment well Patient left: in bed;with call bell/phone within reach;with bed alarm set  OT Visit Diagnosis: Unsteadiness on feet (R26.81);Muscle  weakness (generalized) (M62.81);Feeding difficulties (R63.3);Other symptoms and signs involving the nervous system (R29.898);Other symptoms and signs involving cognitive  function;Hemiplegia and hemiparesis Hemiplegia - Right/Left: Left Hemiplegia - dominant/non-dominant: Non-Dominant                Time: 1425-1441 OT Time Calculation (min): 16 min Charges:  OT General Charges $OT Visit: 1 Visit OT Evaluation $OT Eval Moderate Complexity: 1 [address] OTR/L   Phylliss Bob 01/20/2020, 3:34 PM

## 2020-01-20 NOTE — Progress Notes (Signed)
Rehab Admissions Coordinator Note:  Patient was screened by Cleatrice Burke for appropriateness for an Inpatient Acute Rehab Consult per therapy recs. .  At this time, we are recommending Inpatient Rehab consult. I will place order per protocol.  Cleatrice Burke RN MSN 01/20/2020, 11:00 AM  I can be reached at [phone].

## 2020-01-20 NOTE — Evaluation (Addendum)
Physical Therapy Evaluation Patient Details Name: Andrea Keller MRN: [id number] DOB: 02-23-1946 Today's Date: 01/20/2020   History of Present Illness  74 y.o. 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 She presented to [facility] ED 01/06/2020 after several days of symptoms. An MRI was performed, which demonstrates multifocal areas of cortical ischemia. PMHx- HTN, OA, PVD, CVAs  Clinical Impression   Pt admitted with above diagnosis. Patient with impaired cognition with no family present. After oriented by PT, she could retain some information (or figure it out with simple cues), however would revert to thinking she was at home. Noted she had haldol overnight due to agitation/confusion. Unable to reach pt's family (per RN just recently left) to confirm home set-up, pt's prior functional status, and caregiver availability upon discharge. Patient currently required 4 attempts to stand from EOB (successful x 2 with moderate assistance). She was not able to ambulate due to left lean in standing.   Pt currently with functional limitations due to the deficits listed below (see PT Problem List). Pt will benefit from skilled PT to increase their independence and safety with mobility to allow discharge to the venue listed below.       Follow Up Recommendations CIR (if cognition improves and family can provide necessary assist)    Equipment Recommendations  Other (comment) (TBA at next venue)    Recommendations for Other Services Rehab consult;OT consult;Speech consult     Precautions / Restrictions Precautions Precautions: Fall;Other (comment) Precaution Comments: goal SBP <180      Mobility  Bed Mobility Overal bed mobility: Needs Assistance Bed Mobility: Rolling;Sidelying to Sit;Sit to Sidelying Rolling: Min assist (to left with rail) Sidelying to sit: Mod  assist;HOB elevated     Sit to sidelying: Max assist General bed mobility comments: slow processing, loses track of task; generally weak  Transfers Overall transfer level: Needs assistance Equipment used: Rolling walker (2 wheeled) Transfers: Sit to/from Stand Sit to Stand: Mod assist         General transfer comment: RW anchored to allow pt to pull to stand (she could not process how to push off bed); initial2 attempts achieved 1/2 standing with left lean and pt stopped and returned to sitting; 3-4 trials achieved standing with posterior and left lean with pt requiring max assist progressing to min assist to maintain standing)  Ambulation/Gait             General Gait Details: unable  Stairs            Wheelchair Mobility    Modified Rankin (Stroke Patients Only) Modified Rankin (Stroke Patients Only) Pre-Morbid Rankin Score: Moderately severe disability Modified Rankin: Severe disability     Balance Overall balance assessment: Needs assistance Sitting-balance support: No upper extremity supported;Feet unsupported Sitting balance-Leahy Scale: Fair Sitting balance - Comments: maintains midline; with attempts to functionally use RUE begins to lean left   Standing balance support: Bilateral upper extremity supported Standing balance-Leahy Scale: Zero Standing balance comment: braces back of legs against bed frame; posterior and left lean with poorability to correct                             Pertinent Vitals/Pain Pain Assessment: No/denies pain    Home Living Family/patient expects to be discharged to:: Private residence Living Arrangements: Spouse/significant other;Children (son) Available Help at  Discharge: Family;Available PRN/intermittently (son goes to dialysis; husband runs errands) Type of Home: House Home Access: Stairs to enter Entrance Stairs-Rails: None Entrance Stairs-Number of Steps: 3 Home Layout: One level Home Equipment:  Environmental consultant - 2 wheels;Cane - single point Additional Comments: Pt confused; information from 2017 admission    Prior Function Level of Independence: Needs assistance   Gait / Transfers Assistance Needed: states she walks with cane independently (?reliaable)     Comments: no family present     Hand Dominance   Dominant Hand: Right    Extremity/Trunk Assessment   Upper Extremity Assessment Upper Extremity Assessment: Defer to OT evaluation (LUE spastic and held in flexion; could not fully extend )    Lower Extremity Assessment Lower Extremity Assessment: Generalized weakness;LLE deficits/detail LLE Deficits / Details: slow AROM; AAROM WFL; <3/5 knee extension, but also impaired due to flexor tone    Cervical / Trunk Assessment Cervical / Trunk Assessment: Kyphotic  Communication   Communication: No difficulties  Cognition Arousal/Alertness: Awake/alert Behavior During Therapy: WFL for tasks assessed/performed Overall Cognitive Status: No family/caregiver present to determine baseline cognitive functioning Area of Impairment: Orientation;Attention;Memory;Following commands;Awareness;Problem solving                 Orientation Level: Disoriented to;Place;Time;Situation Current Attention Level: Sustained Memory: Decreased short-term memory Following Commands: Follows one step commands inconsistently;Follows one step commands with increased time   Awareness:  (pre-intellectual) Problem Solving: Slow processing;Difficulty sequencing;Requires verbal cues;Requires tactile cues General Comments: pt re-oriented several times during session that she was not at home (she then states "oh, i'm in the hospital"); September 2014; came in because my lips were swollen; easily distracted and loses track of task she was asked to complete; required max cues for scooting to put feet on the floor and could not process/complete; repeatedly pulled on RW to try to stand up, it would tip, she would  move her hand to the bed and immediately reach to pull on the RW again--repeated multiple times)      General Comments      Exercises     Assessment/Plan    PT Assessment Patient needs continued PT services  PT Problem List Decreased strength;Decreased activity tolerance;Decreased range of motion;Decreased balance;Decreased mobility;Decreased cognition;Decreased knowledge of use of DME;Decreased safety awareness;Impaired tone       PT Treatment Interventions DME instruction;Gait training;Functional mobility training;Stair training;Therapeutic activities;Balance training;Neuromuscular re-education;Cognitive remediation;Patient/family education    PT Goals (Current goals can be found in the Care Plan section)  Acute Rehab PT Goals Patient Stated Goal: to walk PT Goal Formulation: With patient Time For Goal Achievement: 02/03/20 Potential to Achieve Goals: Good    Frequency Min 4X/week   Barriers to discharge Other (comment) (unknown caregiver support (pt confused))      Co-evaluation               AM-PAC PT "6 Clicks" Mobility  Outcome Measure Help needed turning from your back to your side while in a flat bed without using bedrails?: A Little Help needed moving from lying on your back to sitting on the side of a flat bed without using bedrails?: A Lot Help needed moving to and from a bed to a chair (including a wheelchair)?: Total Help needed standing up from a chair using your arms (e.g., wheelchair or bedside chair)?: A Lot Help needed to walk in hospital room?: Total Help needed climbing 3-5 steps with a railing? : Total 6 Click Score: 10    End of Session Equipment  Utilized During Treatment: Gait belt Activity Tolerance: Patient limited by fatigue Patient left: in bed;with call bell/phone within reach;with bed alarm set;with SCD's reapplied Nurse Communication: Mobility status PT Visit Diagnosis: Other symptoms and signs involving the nervous system  (R29.898);Hemiplegia and hemiparesis;Other abnormalities of gait and mobility (R26.89) Hemiplegia - Right/Left: Left Hemiplegia - dominant/non-dominant: Non-dominant Hemiplegia - caused by: Cerebral infarction    Time: 0915-1004 PT Time Calculation (min) (ACUTE ONLY): 49 min   Charges:   PT Evaluation $PT Eval Moderate Complexity: 1 Mod PT Treatments $Therapeutic Activity: 23-37 mins         [name], PT Pager [phone]   [name] 01/20/2020, 10:45 AM

## 2020-01-20 NOTE — Evaluation (Signed)
Speech Language Pathology Evaluation Patient Details Name: Andrea Keller MRN: [id number] DOB: Feb 28, 1946 Today's Date: 01/20/2020 Time: 1135-1205 SLP Time Calculation (min) (ACUTE ONLY): 30 min  Problem List:  Patient Active Problem List   Diagnosis Date Noted  . Acute CVA (cerebrovascular accident) ([place]) 01/27/2020  . Cerebrovascular accident (CVA) due to embolism of cerebral artery ([place]) 12/16/2015  . Foot swelling 12/16/2015  . Palpitations 10/21/2015  . CVA (cerebral vascular accident) ([place]) 10/21/2015  . 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 ICH (intracerebral hemorrhage) ([place]) 09/14/2015  . Transient alteration of awareness   . Other vascular headache   . Aneurysm, cerebral, nonruptured   . Benign essential HTN   . Acute blood loss anemia   . Elevated troponin   . Essential hypertension   . HLD (hyperlipidemia)   . Malignant neoplasm of colon ([place])   . Stroke (cerebrum) ([place]) 09/11/2015  . Intraparenchymal hemorrhage of brain ([place]) 09/10/2015  . Microcytosis   . PVD (peripheral vascular disease) ([place])   . Hypertension   . 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 Thrombocytopenia ([place]) 11/08/2014  . Acute appendicitis 10/28/2014  . Perforated appendicitis 10/28/2014   Past Medical History:  Past Medical History:  Diagnosis Date  . Diverticulosis   . 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 Hyperplastic colon polyp   . Hypertension   . Insomnia   . Microcytosis   . Osteoarthritis   . PVD (peripheral vascular disease) ([place])   . Stroke [facility])    Past Surgical History:  Past Surgical History:  Procedure Laterality Date  . ABDOMINAL HYSTERECTOMY  2009  . APPENDECTOMY  10/28/2014  . CYST REMOVAL NECK    . IR GENERIC HISTORICAL  02/20/2016   IR RADIOLOGIST EVAL & MGMT 02/20/2016 [facility]  . IR GENERIC HISTORICAL  06/13/2016   IR RADIOLOGIST EVAL & MGMT 06/13/2016 [facility]  . LAPAROSCOPIC APPENDECTOMY N/A 10/28/2014   Procedure: APPENDECTOMY  LAPAROSCOPIC;  Surgeon: Erroll Luna, MD;  Location: [place];  Service: General;  Laterality: N/A;   HPI:      Assessment / Plan / Recommendation Clinical Impression  Pt presents with cognitive linguistic deficits, facial & hypoglossal nerve deficits *at least* resulting in left facial asymmetry and mild dysarthria.  Portion of SLUMS administered to pt due to have cognitive linguistic and visual deficits full eval unable to be completed.  She answered 2/7 questions correctly of SLUMS.  Pt was oriented to self and state of [place] and able to verbalize her basic needs with prompting cues.  Largest difficulty was recall of basic information due to decreased sustained attention.  She followed 1 of 2 step directions but did not recall calling RN for eye burning discomfort.  No family present to establish baseline function but pt does admit to worsening memory abilites.  Recommend pt have follow up SLP for cognitive linguistic treatment.    SLP Assessment  SLP Recommendation/Assessment: Patient needs continued Speech Lanaguage Pathology Services SLP Visit Diagnosis: Attention and concentration deficit;Cognitive communication deficit (R41.841)    Follow Up Recommendations  Inpatient Rehab    Frequency and Duration min 1 x/week  1 week      SLP Evaluation Cognition  Overall Cognitive Status: No family/caregiver present to determine baseline cognitive functioning Arousal/Alertness: Awake/alert Orientation Level: Oriented to person;Disoriented to place;Disoriented to time;Disoriented to situation (stated 2014 and "[place]" for place, stated hospital with verbal choice of 2) Attention: Focused;Sustained Focused Attention: Appears intact Sustained Attention: Impaired Memory: Impaired (pt did not recall calling  RN for eye burning after five minutes with two trials) Memory Impairment: Storage deficit;Retrieval deficit;Decreased recall of new information;Decreased short term memory;Prospective  memory Decreased Short Term Memory: Verbal basic Awareness: Impaired Awareness Impairment: Intellectual impairment Problem Solving: Impaired Problem Solving Impairment: Functional basic (locating call bell in bed- pt did not look to her lap despite being informed x3- inattention also impacting her) Safety/Judgment: Impaired       Comprehension  Auditory Comprehension Overall Auditory Comprehension: Impaired Yes/No Questions: Not tested Commands: Impaired One Step Basic Commands: 75-100% accurate Two Step Basic Commands: 0-24% accurate Conversation: Simple Interfering Components: Attention;Visual impairments;Working Field seismologist: Conservation officer, nature: Exceptions to [facility] (suspect severe inattention to left and severe vision deficits impacting pt negatively despite having glasses on) Reading Comprehension Reading Status: Not tested (pt not pointing to objects on paper - pointed above paper)    Expression Expression Primary Mode of Expression: Verbal Verbal Expression Initiation: No impairment Level of Generative/Spontaneous Verbalization: Phrase Repetition: No impairment Naming: Impairment (visual impairment suspected, able to verbalize correct object name x1 independently, x2 with responsive naming) Confrontation:  (see above) Pragmatics: No impairment Non-Verbal Means of Communication: Not applicable Written Expression Dominant Hand: Right Written Expression: Unable to assess (comment) (pt attempting to write an "X" on triangle on paper, placed it to the left side)   Oral / Motor  Oral Motor/Sensory Function Overall Oral Motor/Sensory Function: Moderate impairment Facial ROM: Reduced left Facial Symmetry: Abnormal symmetry left Facial Strength: Reduced left Facial Sensation: Reduced left Lingual ROM: Reduced left Lingual Symmetry: Abnormal symmetry right Lingual Strength: Suspected CN XII  (hypoglossal) dysfunction Lingual Sensation: Suspected CN VII (facial) dysfunction-anterior 2/3 tongue Velum: Within Functional Limits Mandible: Other (Comment) Motor Speech Phonation: Low vocal intensity Resonance: Within functional limits Articulation: Impaired Level of Impairment: Phrase (mild dysarthria but pt is intelligible) Intelligibility: Intelligible Motor Planning: Witnin functional limits Interfering Components: Anatomical limitations;Inadequate dentition;Premorbid status   GO                    Macario Golds 01/20/2020, 12:17 PM  Kathleen Lime, MS [place] Office [phone]

## 2020-01-20 NOTE — Consult Note (Signed)
Physical Medicine and Rehabilitation Consult Reason for Consult: Increased facial weakness Referring Physician: [facility]   HPI: Andrea Keller is a 74 y.o. 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  History taken from chart review and husband due to memory.  Patient lives with spouse and son.  1 level home 3 steps to entry.  Patient required assistance with all ADLs PTA.  She presented on 01/06/2020 with facial weakness. CT MRI showed multiple punctate foci of abnormal diffusion restriction predominantly within the right hemisphere in a watershed distribution.  Findings of chronic ischemic microangiopathy and generalized atrophy with multiple old infarcts.  CT angiogram of head and neck with no large vessel occlusion.  Echocardiogram with ejection fraction of 70 percent with grade 1 diastolic dysfunction.  Admission chemistries unremarkable except creatinine 1.10, urine drug screen negative, hemoglobin A1c 6.0.  Presently maintained on aspirin and Plavix for CVA prophylaxis.  Therapy evaluations completed with recommendations of physical medicine rehab consult.  Review of Systems  Constitutional: Positive for malaise/fatigue. Negative for chills and fever.  HENT: Negative for hearing loss.   Eyes: Negative for blurred vision and double vision.  Respiratory: Negative for cough and shortness of breath.   Cardiovascular: Negative for chest pain.  Gastrointestinal: Positive for constipation. Negative for heartburn, nausea and vomiting.  Genitourinary: Negative for dysuria, flank pain and hematuria.  Musculoskeletal: Positive for joint pain and myalgias.  Skin: Negative for rash.  Neurological: Positive for weakness.       Spastic left hemiparesis  Psychiatric/Behavioral:  The patient has insomnia.   All other systems reviewed and are negative.  Past Medical History:  Diagnosis Date  . Diverticulosis   . 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 Hyperplastic colon polyp   . Hypertension   . Insomnia   . Microcytosis   . Osteoarthritis   . PVD (peripheral vascular disease) ([place])   . Stroke [facility])    Past Surgical History:  Procedure Laterality Date  . ABDOMINAL HYSTERECTOMY  2009  . APPENDECTOMY  10/28/2014  . CYST REMOVAL NECK    . IR GENERIC HISTORICAL  02/20/2016   IR RADIOLOGIST EVAL & MGMT 02/20/2016 [facility]  . IR GENERIC HISTORICAL  06/13/2016   IR RADIOLOGIST EVAL & MGMT 06/13/2016 [facility]  . LAPAROSCOPIC APPENDECTOMY N/A 10/28/2014   Procedure: APPENDECTOMY LAPAROSCOPIC;  Surgeon: Erroll Luna, MD;  Location: [facility] OR;  Service: General;  Laterality: N/A;   Family History  Problem Relation Age of Onset  . Diabetes Mother   . Lung cancer Father   . Transient ischemic attack Son   . Colon cancer Brother   . Alcohol abuse Other    Social History:  reports that she has quit smoking. She has never used smokeless tobacco. She reports that she does not drink alcohol and does not use drugs. Allergies:  Allergies  Allergen Reactions  . Latex Rash   Medications Prior to Admission  Medication Sig Dispense Refill  . acetaminophen (TYLENOL) 325 MG tablet Take 650 mg by mouth daily as needed for moderate pain.     Marland Kitchen aspirin EC 81 MG tablet Take 1 tablet (81 mg total) by mouth daily.    . cholecalciferol (VITAMIN D3) 25 MCG (1000 UT) tablet Take 5,000 Units by mouth daily.    . ferrous sulfate 325 (  65 FE) MG tablet Take 1 tablet (325 mg total) by mouth daily with breakfast. 30 tablet 3  . furosemide (LASIX) 20 MG tablet Take 20 mg by mouth daily.     Marland Kitchen lidocaine (LIDODERM) 5 % Place 1 patch onto the skin daily as needed (back pain). Remove & Discard patch within 12 hours or as directed by MD    . pantoprazole (PROTONIX) 40 MG tablet Take 1  tablet (40 mg total) by mouth daily at 12 noon. 30 tablet 0  . rosuvastatin (CRESTOR) 20 MG tablet Take 20 mg by mouth daily.    Marland Kitchen HYDROcodone-acetaminophen (NORCO/VICODIN) 5-325 MG tablet Take 1 tablet by mouth every 6 (six) hours as needed. (Patient not taking: Reported on 01/30/2020) 10 tablet 0  . predniSONE (DELTASONE) 20 MG tablet Take 60 mg daily x 2 days then 40 mg daily x 2 days then 20 mg daily x 2 days (Patient not taking: Reported on 01/30/2020) 12 tablet 0  . rosuvastatin (CRESTOR) 10 MG tablet Take 1 tablet (10 mg total) by mouth daily. (Patient not taking: Reported on 06/08/2018) 30 tablet 1    Home: [place] expects to be discharged to:: Private residence Living Arrangements: Spouse/significant other, Children (son) Available Help at Discharge: Family, Available PRN/intermittently (son goes to dialysis; husband runs errands) Type of Home: House Home Access: Stairs to enter Technical brewer of Steps: 3 Entrance Stairs-Rails: None Home Layout: One level Bathroom Shower/Tub: Chiropodist: Standard Bathroom Accessibility: Yes Home Equipment: Environmental consultant - 2 wheels, Cane - single point Additional Comments: Pt confused; information from 2017 admission  Functional History: Prior Function Level of Independence: Needs assistance Gait / Transfers Assistance Needed: states she walks with cane independently (?reliaable) Comments: no family present Functional Status:  Mobility: Bed Mobility Overal bed mobility: Needs Assistance Bed Mobility: Rolling, Sidelying to Sit, Sit to Sidelying Rolling: Min assist (to left with rail) Sidelying to sit: Mod assist, HOB elevated Sit to sidelying: Max assist General bed mobility comments: slow processing, loses track of task; generally weak Transfers Overall transfer level: Needs assistance Equipment used: Rolling walker (2 wheeled) Transfers: Sit to/from Stand Sit to Stand: Mod assist General transfer  comment: RW anchored to allow pt to pull to stand (she could not process how to push off bed); initial2 attempts achieved 1/2 standing with left lean and pt stopped and returned to sitting; 3-4 trials achieved standing with posterior and left lean with pt requiring max assist progressing to min assist to maintain standing) Ambulation/Gait General Gait Details: unable    ADL:    Cognition: Cognition Overall Cognitive Status: No family/caregiver present to determine baseline cognitive functioning Orientation Level: Oriented to person, Disoriented to place, Disoriented to time, Disoriented to situation Cognition Arousal/Alertness: Awake/alert Behavior During Therapy: WFL for tasks assessed/performed Overall Cognitive Status: No family/caregiver present to determine baseline cognitive functioning Area of Impairment: Orientation, Attention, Memory, Following commands, Awareness, Problem solving Orientation Level: Disoriented to, Place, Time, Situation Current Attention Level: Sustained Memory: Decreased short-term memory Following Commands: Follows one step commands inconsistently, Follows one step commands with increased time Awareness:  (pre-intellectual) Problem Solving: Slow processing, Difficulty sequencing, Requires verbal cues, Requires tactile cues General Comments: pt re-oriented several times during session that she was not at home (she then states "oh, i'm in the hospital"); September 2014; came in because my lips were swollen; easily distracted and loses track of task she was asked to complete; required max cues for scooting to put feet on the floor  and could not process/complete; repeatedly pulled on RW to try to stand up, it would tip, she would move her hand to the bed and immediately reach to pull on the RW again--repeated multiple times)  Blood pressure (!) 191/72, pulse 84, temperature 98.3 F (36.8 C), temperature source Oral, resp. rate 17, height 5\' 5"  (1.651 m), weight 68.3  kg, SpO2 98 %. Physical Exam Vitals reviewed.  Constitutional:      Comments: Thin  HENT:     Head: Normocephalic and atraumatic.     Comments: Poor dentition    Right Ear: External ear normal.     Left Ear: External ear normal.     Nose: Nose normal.  Eyes:     General:        Right eye: No discharge.        Left eye: No discharge.     Extraocular Movements: Extraocular movements intact.  Cardiovascular:     Rate and Rhythm: Normal rate and regular rhythm.  Pulmonary:     Effort: Pulmonary effort is normal. No respiratory distress.     Breath sounds: No stridor.  Abdominal:     General: Abdomen is flat. There is no distension.     Palpations: Abdomen is soft.  Musculoskeletal:     Cervical back: Normal range of motion and neck supple.     Comments: No edema or tenderness in extremities  Skin:    General: Skin is warm and dry.  Neurological:     Mental Status: She is alert.     Comments: Patient is alert in no acute distress.   Follows simple verbal commands.   Provides her name and age needed some cues for year. Limited medical historian. Motor: RUE/RLE: 4 -/5 proximal distal LUE/LLE: 4 -/5 within available range (limited by spasticity)  Psychiatric:        Mood and Affect: Mood normal.        Behavior: Behavior normal.     No results found for this or any previous visit (from the past 24 hour(s)). CT Angio Head W or Wo Contrast  Result Date: 01/12/2020 CLINICAL DATA:  Fall, left-sided deficit. EXAM: CT ANGIOGRAPHY HEAD AND NECK TECHNIQUE: Multidetector CT imaging of the head and neck was performed using the standard protocol during bolus administration of intravenous contrast. Multiplanar CT image reconstructions and MIPs were obtained to evaluate the vascular anatomy. Carotid stenosis measurements (when applicable) are obtained utilizing NASCET criteria, using the distal internal carotid diameter as the denominator. CONTRAST:  148mL OMNIPAQUE IOHEXOL 350 MG/ML SOLN  COMPARISON:  None. FINDINGS: CTA NECK FINDINGS SKELETON: There is no bony spinal canal stenosis. No lytic or blastic lesion. OTHER NECK: Normal pharynx, larynx and major salivary glands. No cervical lymphadenopathy. Unremarkable thyroid gland. UPPER CHEST: No pneumothorax or pleural effusion. No nodules or masses. AORTIC ARCH: There is mild calcific atherosclerosis of the aortic arch. There is no aneurysm, dissection or hemodynamically significant stenosis of the visualized portion of the aorta. Conventional 3 vessel aortic branching pattern. The visualized proximal subclavian arteries are widely patent. RIGHT CAROTID SYSTEM: Normal without aneurysm, dissection or stenosis. LEFT CAROTID SYSTEM: No dissection, occlusion or aneurysm. Mild atherosclerotic calcification at the carotid bifurcation without hemodynamically significant stenosis. VERTEBRAL ARTERIES: Right dominant configuration. Both origins are clearly patent. There is no dissection, occlusion or flow-limiting stenosis to the skull base (V1-V3 segments). CTA HEAD FINDINGS POSTERIOR CIRCULATION: --Vertebral arteries: Moderate narrowing of the left V4 segment due to atherosclerotic calcification. Normal right V4 segment. --Inferior  cerebellar arteries: Normal. --Basilar artery: Normal. --Superior cerebellar arteries: Normal. --Posterior cerebral arteries (PCA): Normal. 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 --Anterior cerebral arteries (ACA): Normal. Both A1 segments are present. Patent anterior communicating artery (a-comm). --Middle cerebral arteries (MCA): Normal. VENOUS SINUSES: As permitted by contrast timing, patent. ANATOMIC VARIANTS: Fetal origins of both posterior cerebral arteries. Review of the MIP images confirms the above findings. IMPRESSION: 1. No emergent large vessel occlusion or high-grade stenosis of the intracranial arteries. 2. 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 3. Moderate narrowing of the left vertebral artery V4 segment due to atherosclerotic calcification. 4. Aortic Atherosclerosis (ICD10-I70.0). Electronically Signed   By: Ulyses Jarred M.D.   On: 01/28/2020 23:24   DG Lumbar Spine Complete  Result Date: 01/26/2020 CLINICAL DATA:  Fall with low back pain EXAM: LUMBAR SPINE - COMPLETE 4+ VIEW COMPARISON:  10/24/2019 FINDINGS: Alignment within normal limits. Vertebral body heights are grossly maintained. Mild degenerative changes most evident at L5-S1. Mild facet degenerative change at the lower lumbar spine. Aortic atherosclerosis. IMPRESSION: No acute osseous abnormality. Electronically Signed   By: Donavan Foil M.D.   On: 01/22/2020 20:20   CT HEAD WO CONTRAST  Result Date: 01/12/2020 CLINICAL DATA:  Fall EXAM: CT HEAD WITHOUT CONTRAST TECHNIQUE: Contiguous axial images were obtained from the base of the skull through the vertex without intravenous contrast. COMPARISON:  CT 06/08/2018 FINDINGS: Brain: Stable regions of encephalomalacia throughout the right frontal parietal and occipital lobe. Few thin cortical calcifications along the right frontal lobe likely reflecting sequela of prior hemorrhage. New areas of cortically based hypoattenuation are seen involving the anterosuperior left frontal lobe. These are age indeterminate, favor chronic though new since December of 2019. No evidence of hemorrhage, hydrocephalus, extra-axial collection or mass lesion/mass effect. Symmetric prominence of the ventricles, cisterns and sulci compatible with parenchymal volume loss. Patchy areas of white matter hypoattenuation are most compatible with chronic microvascular angiopathy. Vascular: Intracranial atherosclerosis. Redemonstration of the supraclinoid left ICA aneurysm without evidence of acute hemorrhage or rupture measuring up to 9 mm in diameter. Slight dolichoectasia of the right supraclinoid ICA as well. No abnormal hyperdense  vessel. Skull: No calvarial fracture or suspicious osseous lesion. No scalp swelling or hematoma. Sinuses/Orbits: Paranasal sinuses and mastoid air cells are predominantly clear. Included orbital structures are unremarkable. Other: Debris in the external auditory canals. IMPRESSION: 1. New areas of cortically based hypoattenuation involving the anterosuperior left frontal lobe. Age indeterminate though favor chronic though new since December of 2019. If there is concern for acute ischemia, MRI could be obtained for further evaluation. 2. Additional stable regions of encephalomalacia throughout the right frontal parietal and occipital lobe compatible with prior infarct and remote right frontal lobe hemorrhage. 3. Redemonstration of the supraclinoid left ICA aneurysm without evidence of acute hemorrhage or rupture measuring up to 9 mm in diameter. 4. Slight dolichoectasia of the right supraclinoid ICA as well. Electronically Signed   By: Lovena Le M.D.   On: 01/24/2020 18:29   CT Angio Neck W and/or Wo Contrast  Result Date: 01/03/2020 CLINICAL DATA:  Fall, left-sided deficit. EXAM: CT ANGIOGRAPHY HEAD AND NECK TECHNIQUE: Multidetector CT imaging of the head and neck was performed using the standard protocol during bolus administration of intravenous contrast. Multiplanar CT image reconstructions and MIPs were obtained to evaluate the vascular anatomy. Carotid stenosis measurements (when applicable) are obtained utilizing NASCET criteria, using the distal internal carotid  diameter as the denominator. CONTRAST:  154mL OMNIPAQUE IOHEXOL 350 MG/ML SOLN COMPARISON:  None. FINDINGS: CTA NECK FINDINGS SKELETON: There is no bony spinal canal stenosis. No lytic or blastic lesion. OTHER NECK: Normal pharynx, larynx and major salivary glands. No cervical lymphadenopathy. Unremarkable thyroid gland. UPPER CHEST: No pneumothorax or pleural effusion. No nodules or masses. AORTIC ARCH: There is mild calcific atherosclerosis  of the aortic arch. There is no aneurysm, dissection or hemodynamically significant stenosis of the visualized portion of the aorta. Conventional 3 vessel aortic branching pattern. The visualized proximal subclavian arteries are widely patent. RIGHT CAROTID SYSTEM: Normal without aneurysm, dissection or stenosis. LEFT CAROTID SYSTEM: No dissection, occlusion or aneurysm. Mild atherosclerotic calcification at the carotid bifurcation without hemodynamically significant stenosis. VERTEBRAL ARTERIES: Right dominant configuration. Both origins are clearly patent. There is no dissection, occlusion or flow-limiting stenosis to the skull base (V1-V3 segments). CTA HEAD FINDINGS POSTERIOR CIRCULATION: --Vertebral arteries: Moderate narrowing of the left V4 segment due to atherosclerotic calcification. Normal right V4 segment. --Inferior cerebellar arteries: Normal. --Basilar artery: Normal. --Superior cerebellar arteries: Normal. --Posterior cerebral arteries (PCA): Normal. 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 --Anterior cerebral arteries (ACA): Normal. Both A1 segments are present. Patent anterior communicating artery (a-comm). --Middle cerebral arteries (MCA): Normal. VENOUS SINUSES: As permitted by contrast timing, patent. ANATOMIC VARIANTS: Fetal origins of both posterior cerebral arteries. Review of the MIP images confirms the above findings. IMPRESSION: 1. No emergent large vessel occlusion or high-grade stenosis of the intracranial arteries. 2. 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 3. Moderate narrowing of the left vertebral artery V4 segment due to atherosclerotic calcification. 4. Aortic Atherosclerosis (ICD10-I70.0). Electronically Signed   By: Ulyses Jarred M.D.   On: 01/27/2020 23:24   MR BRAIN WO CONTRAST  Result Date: 01/11/2020 CLINICAL DATA:  Left-sided neurologic deficits.  Recent fall. EXAM:  MRI HEAD WITHOUT CONTRAST TECHNIQUE: Multiplanar, multiecho pulse sequences of the brain and surrounding structures were obtained without intravenous contrast. COMPARISON:  Head CT 01/01/2020 FINDINGS: Brain: Multiple punctate foci abnormal diffusion restriction, predominantly within the right hemisphere in a watershed distribution. On the left, there are small foci of diffusion restriction in the left temporal lobe and left frontal white matter. Diffuse confluent hyperintense T2-weighted signal within the periventricular, deep and juxtacortical white matter, most commonly due to chronic ischemic microangiopathy. There are multiple old infarcts within both hemispheres, right worse than left. There is generalized atrophy without lobar predilection. Normal midline structures. Hemosiderin deposition nodes are much of the right hemisphere. No acute hemorrhage. No midline shift or other mass effect. Vascular: There is flow voids are maintained. 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 Skull and upper cervical spine: Normal marrow signal. Sinuses/Orbits: Negative. Other: None IMPRESSION: 1. Multiple (3-5) punctate foci of abnormal diffusion restriction, predominantly within the right hemisphere in a watershed distribution. 2. Findings of chronic ischemic microangiopathy and generalized atrophy with multiple old infarcts. Electronically Signed   By: Ulyses Jarred M.D.   On: 01/02/2020 19:38   ECHOCARDIOGRAM COMPLETE  Result Date: 01/19/2020    ECHOCARDIOGRAM REPORT   Patient Name:   ILINA XU Date of Exam: 01/19/2020 Medical Rec #:  [id number]       Height:       65.0 in Accession #:    [id number]      Weight:       169.0 lb Date of Birth:  [birth year]  BSA:          1.841 m Patient Age:    22 years        BP:           152/61 mmHg Patient Gender: F               HR:           62 bpm. Exam Location:  Inpatient Procedure: 2D Echo, Color Doppler and Cardiac Doppler Indications:      Stroke i163.9  History:         Patient has prior history of Echocardiogram examinations, most                  recent 09/13/2015. Risk Factors:Hypertension and Dyslipidemia.  Sonographer:     Raquel Sarna Senior RDCS Referring Phys:  [place] Diagnosing Phys: Eleonore Chiquito MD IMPRESSIONS  1. Left ventricular ejection fraction, by estimation, is 65 to 70%. The left ventricle has normal function. The left ventricle has no regional wall motion abnormalities. Left ventricular diastolic parameters are consistent with Grade I diastolic dysfunction (impaired relaxation).  2. Right ventricular systolic function is normal. The right ventricular size is normal. Tricuspid regurgitation signal is inadequate for assessing PA pressure.  3. The mitral valve is grossly normal. Mild mitral valve regurgitation. No evidence of mitral stenosis.  4. The aortic valve is moderately calcified (predominant leaflet tip inovlement) with eccentric AI that is directed toward the mitral valve. The regurgitation is mild. Similar in description to the study from 2017 but unable to view images. The aortic valve is tricuspid. Aortic valve regurgitation is mild. Mild to moderate aortic valve sclerosis/calcification is present, without any evidence of aortic stenosis.  5. The inferior vena cava is dilated in size with <50% respiratory variability, suggesting right atrial pressure of 15 mmHg. Comparison(s): No significant change from prior study. Conclusion(s)/Recommendation(s): No intracardiac source of embolism detected on this transthoracic study. A transesophageal echocardiogram is recommended to exclude cardiac source of embolism if clinically indicated. FINDINGS  Left Ventricle: Left ventricular ejection fraction, by estimation, is 65 to 70%. The left ventricle has normal function. The left ventricle has no regional wall motion abnormalities. The left ventricular internal cavity size was normal in size. There is  no left ventricular  hypertrophy. Left ventricular diastolic parameters are consistent with Grade I diastolic dysfunction (impaired relaxation). Normal left ventricular filling pressure. Right Ventricle: The right ventricular size is normal. No increase in right ventricular wall thickness. Right ventricular systolic function is normal. Tricuspid regurgitation signal is inadequate for assessing PA pressure. Left Atrium: Left atrial size was normal in size. Right Atrium: Right atrial size was normal in size. Pericardium: Trivial pericardial effusion is present. Mitral Valve: The mitral valve is grossly normal. Mild mitral valve regurgitation. No evidence of mitral valve stenosis. Tricuspid Valve: The tricuspid valve is grossly normal. Tricuspid valve regurgitation is trivial. No evidence of tricuspid stenosis. Aortic Valve: The aortic valve is moderately calcified (predominant leaflet tip inovlement) with eccentric AI that is directed toward the mitral valve. The regurgitation is mild. Similar in description to the study from 2017 but unable to view images. The aortic valve is tricuspid. . There is moderate thickening and moderate calcification of the aortic valve. Aortic valve regurgitation is mild. Aortic regurgitation PHT measures 572 msec. Mild to moderate aortic valve sclerosis/calcification is present, without any evidence of aortic stenosis. There is moderate thickening of the aortic valve. There is moderate calcification of the aortic valve.  Pulmonic Valve: The pulmonic valve was grossly normal. Pulmonic valve regurgitation is not visualized. No evidence of pulmonic stenosis. Aorta: The aortic root and ascending aorta are structurally normal, with no evidence of dilitation. Venous: The inferior vena cava is dilated in size with less than 50% respiratory variability, suggesting right atrial pressure of 15 mmHg. IAS/Shunts: The atrial septum is grossly normal.  LEFT VENTRICLE PLAX 2D LVIDd:         3.70 cm  Diastology LVIDs:          2.50 cm  LV e' lateral:   7.83 cm/s LV PW:         1.10 cm  LV E/e' lateral: 8.8 LV IVS:        1.10 cm  LV e' medial:    5.22 cm/s LVOT diam:     1.90 cm  LV E/e' medial:  13.1 LV SV:         79 LV SV Index:   43 LVOT Area:     2.84 cm  RIGHT VENTRICLE RV S prime:     14.80 cm/s TAPSE (M-mode): 2.1 cm LEFT ATRIUM             Index       RIGHT ATRIUM           Index LA diam:        2.80 cm 1.52 cm/m  RA Area:     13.30 cm LA Vol (A2C):   63.4 ml 34.43 ml/m RA Volume:   29.00 ml  15.75 ml/m LA Vol (A4C):   31.7 ml 17.21 ml/m LA Biplane Vol: 45.1 ml 24.49 ml/m  AORTIC VALVE LVOT Vmax:   131.00 cm/s LVOT Vmean:  78.600 cm/s LVOT VTI:    0.278 m AI PHT:      572 msec  AORTA Ao Root diam: 3.60 cm Ao Asc diam:  3.10 cm MITRAL VALVE MV Area (PHT): 2.60 cm    SHUNTS MV Decel Time: 292 msec    Systemic VTI:  0.28 m MV E velocity: 68.60 cm/s  Systemic Diam: 1.90 cm MV A velocity: 98.10 cm/s MV E/A ratio:  0.70 Eleonore Chiquito MD Electronically signed by Eleonore Chiquito MD Signature Date/Time: 01/19/2020/12:04:40 PM    Final (Updated)     Assessment/Plan: Diagnosis: 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  Records reviewed and summated above.  1. Does the need for close, 24 hr/day medical supervision in concert with the patient's rehab needs make it unreasonable for this patient to be served in a less intensive setting? Potentially  2. 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 Due to bladder management, bowel management, safety, skin/wound care, disease management, medication administration and patient education, does the patient  require 24 hr/day rehab nursing? Potentially 4. Does the patient require coordinated care of a physician, rehab nurse, therapy disciplines of PT/OT/SLP to address physical and functional deficits in the context of the above medical diagnosis(es)? Potentially Addressing deficits in the following areas: balance, endurance, locomotion, strength, transferring, bowel/bladder control, bathing, dressing, feeding, grooming, toileting, cognition and psychosocial support 5. Can the patient actively participate in an intensive therapy program of at least 3 hrs of therapy per day at least 5 days per week? Yes  6. The potential for patient to make measurable gains while on inpatient rehab is TBD 7. Anticipated functional outcomes upon discharge from inpatient rehab are min assist and mod assist  with PT, min assist and mod assist with OT, min assist and mod assist with SLP. 8. Estimated rehab length of stay to reach the above functional goals is: 3-5 days. 9. Anticipated discharge destination: Home 10. Overall Rehab/Functional Prognosis: good and fair  RECOMMENDATIONS: This patient's condition is appropriate for continued rehabilitative care in the following setting: Potentially CIR.  Husband indicates that patient is at baseline level of functioning needing assistance for all ADLs. If patient significantly far from baseline would recommend CIR, otherwise home with HH. Patient has agreed to participate in recommended program. Potentially Note that insurance prior authorization may be required for reimbursement for recommended care.  Comment: Rehab Admissions Coordinator to follow up.  I have personally performed a face to face diagnostic evaluation, including, but not limited to relevant history and physical exam findings, of this patient and developed relevant assessment and plan.  Additionally, I have reviewed and concur with the physician assistant's documentation above.   Delice Lesch, MD, ABPMR Lavon Paganini  Angiulli, PA-C 01/20/2020

## 2020-01-20 NOTE — Progress Notes (Signed)
   01/20/20 2132  Assess: MEWS Score  Temp 99.1 F (37.3 C)  BP (!) 150/66  Pulse Rate (!) 109  ECG Heart Rate (!) 108  Resp (!) 24  Level of Consciousness Alert  SpO2 100 %  O2 Device Room Air  Patient Activity (if Appropriate) In bed  Assess: MEWS Score  MEWS Temp 0  MEWS Systolic 0  MEWS Pulse 1  MEWS RR 1  MEWS LOC 0  MEWS Score 2  MEWS Score Color Yellow  Assess: if the MEWS score is Yellow or Red  Were vital signs taken at a resting state? Yes  Focused Assessment Change from prior assessment (see assessment flowsheet)  Early Detection of Sepsis Score *See Row Information* Low  MEWS guidelines implemented *See Row Information* Yes  Treat  MEWS Interventions Other (Comment) (Getting cleaned up)  Pain Scale 0-10  Pain Score 0  Take Vital Signs  Increase Vital Sign Frequency  Yellow: Q 2hr X 2 then Q 4hr X 2, if remains yellow, continue Q 4hrs  Escalate  MEWS: Escalate Yellow: discuss with charge nurse/RN and consider discussing with provider and RRT  Notify: Charge Nurse/RN  Name of Charge Nurse/RN Notified Ronalee Belts RN  Date Charge Nurse/RN Notified 01/20/20  Time Charge Nurse/RN Notified 2136  Document  Patient Outcome Other (Comment) (Resting in bed)

## 2020-01-20 NOTE — Progress Notes (Signed)
STROKE TEAM PROGRESS NOTE   INTERVAL HISTORY I have personally reviewed history of presenting illness with the patient, electronic medical records and imaging films in PACS.  She has past medical history of stroke with spastic left hemiplegia presented with increased left facial weakness.  MRI shows multifocal tiny embolic-looking infarcts right hemisphere.  CT angiogram showed no large vessel stenosis or occlusion but showed 7 mm terminal left ICA aneurysm.  LDL cholesterol 56 mg percent and hemoglobin A1c 6.0.  Urine drug screen is negative.  Vitals:   01/20/20 0216 01/20/20 0310 01/20/20 0406 01/20/20 0734  BP: (!) 192/80 (!) 141/74 (!) 154/66 (!) 191/72  Pulse: 80 84 78 84  Resp: 15 11 12 17   Temp:  98.1 F (36.7 C) 97.8 F (36.6 C) 98.3 F (36.8 C)  TempSrc:  Oral Oral Oral  SpO2: 92% 98% 99% 98%  Weight:      Height:       CBC:  Recent Labs  Lab 01/24/2020 1653  WBC 9.2  NEUTROABS 5.5  HGB 11.1*  HCT 37.6  MCV 79.7*  PLT 95*   Basic Metabolic Panel:  Recent Labs  Lab 01/13/2020 1653  NA 142  K 4.3  CL 102  CO2 26  GLUCOSE 90  BUN 15  CREATININE 1.10*  CALCIUM 9.9   Lipid Panel:  Recent Labs  Lab 01/19/20 0445  CHOL 114  TRIG 99  HDL 38*  CHOLHDL 3.0  VLDL 20  LDLCALC 56   HgbA1c:  Recent Labs  Lab 01/19/20 0445  HGBA1C 6.0*   Urine Drug Screen:  Recent Labs  Lab 01/26/2020 2105  LABOPIA NONE DETECTED  COCAINSCRNUR NONE DETECTED  LABBENZ NONE DETECTED  AMPHETMU NONE DETECTED  THCU NONE DETECTED  LABBARB NONE DETECTED    Alcohol Level  Recent Labs  Lab 01/23/2020 1653  ETH <10    IMAGING past 24 hours No results found.  PHYSICAL EXAM Elderly African-American lady not in distress. . Afebrile. Head is nontraumatic. Neck is supple without bruit.    Cardiac exam no murmur or gallop. Lungs are clear to auscultation. Distal pulses are well felt. Neurological Exam :  She is awake alert oriented time place and person.  Speech is clear without  dysarthria or aphasia.  Extraocular movements are full range without nystagmus.  She blinks to threat bilaterally.  Moderate left lower facial weakness.  Tongue midline.  Motor system exam shows spastic left hemiparesis with with 3/5 left upper extremity and 4/5 left lower extremity strength with weakness of left grip and intrinsic hand muscles..  Tone is increased on the left compared to the right.  Deep tendon flexes are brisker on the left than the right.  Sensation is symmetric.  Plantars are downgoing.  Gait not tested.  ASSESSMENT/PLAN Andrea Keller is a 74 y.o. female with history of multiple previous strokes including what was likely a hemorrhagic infarct in 2017, HTN, PVD, OA  presenting with increased facial weakness.   Stroke:   Multiple R punctate infarcts embolic secondary to unknown source, not an AC given prior ICH, so will not pursue additional embolic workup  CT head New hypoattenutation L frontal lobe. Stable old infarcts R frontal parietal, occipital. Old R frontal lobe hemorrhage. L supraclinoid aneurysm.   MRI  Multiple punctate R brain infarcts in watershed distribution. Small vessel disease. Atrophy.   CTA head & neck no LVO. L ICA fusiform aneurysm. Moderate narrowing L V4. Aortic atherosclerosis.   2D Echo EF 60-65%.  No source of embolus   2017 30 d monitor neg for AF  LDL 56  HgbA1c 6.0  VTE prophylaxis - SCDs   aspirin 81 mg daily prior to admission, now on aspirin 325 mg daily. Decrease aspirin to 81 and add plavix 75. Continue DAPT x 3 weeks then plavix alone    Therapy recommendations:  CIR  Disposition:  pending   paraclinoid aneurysm  7x16mm  Unchanged since 2017  Evaluated by Dr. [name] - mcerebral angiogram on 10/18/2015 which showed 11x9 fusiform left ICA near PCOM aneurysm as well as 2 mm aneurysm of right ICA cavernous segmencerebral angiogram on 10/18/2015 which showed 11x9 fusiform left ICA near PCOM aneurysm as well as 2 mm aneurysm  of right ICA cavernous segment  Follow up recommended w/ Deveshwar but not documentation this ever occurred  Hypertension  Stable on the high side . Permissive hypertension (OK if < 220/120) but gradually normalize in 5-7 days . 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  Discussed with Dr. Donalee Citrin.  Greater than 50% time during the 35-minute visit was spent in counseling and coordination of care about her embolic strokes and aneurysm answering questions. Antony Contras, MD  To contact Stroke Continuity provider, please refer to [email or web address] After hours, contact General Neurology

## 2020-01-20 NOTE — Progress Notes (Signed)
PROGRESS NOTE        PATIENT DETAILS Name: Andrea Keller Age: 74 y.o. Sex: female Date of Birth: [birth year] Admit Date: 01/17/2020 Admitting Physician Elwyn Reach, MD FIE:PPIRJJO, Fransico Him, MD  Brief Narrative: Patient is a 74 y.o. female with history of prior CVA-chronic left-sided hemiparesis-presenting with left facial droop-MRI brain with acute infarct.  Admitted to the hospitalist service for further evaluation and treatment.  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  Discussed with Dr. Halford Chessman lacunar infarct.  Recommendations are for aspirin/Plavix for 3 weeks followed by Plavix alone.  Remains on statin.  Evaluated by PT with recommendations for CIR-we will await further work to see if patient is appropriate CIR candidate.  HLD: Continue statin   Diet: Diet Order            Diet Heart Room service appropriate? Yes; Fluid consistency: Thin  Diet effective now                  Code Status: Full code   Family Communication: Spouse at bedside  Disposition Plan: Status is: Inpatient  Remains inpatient appropriate because:Inpatient level of care appropriate due to severity of  illness   Dispo: The patient is from: Home              Anticipated d/c is to: CIR vs HHPT              Anticipated d/c date is: 1 day              Patient currently is medically stable to d/c.   Barriers to Discharge: Await PT/CIR evaluation today to determine safe disposition  Antimicrobial agents: Anti-infectives (From admission, onward)   None       Time spent: 25- minutes-Greater than 50% of this time was spent in counseling, explanation of diagnosis, planning of further management, and coordination of care.  MEDICATIONS: Scheduled Meds: .  stroke: mapping our early stages of recovery book   Does not apply Once  . [START ON 01/21/2020] aspirin EC  81 mg Oral Daily  . clopidogrel  75 mg Oral Daily  . rosuvastatin  20 mg Oral Daily   Continuous Infusions: . sodium chloride 10 mL/hr at 01/20/20 0918   PRN Meds:.acetaminophen **OR** acetaminophen (TYLENOL) oral liquid 160 mg/5 mL **OR** acetaminophen, senna-docusate   PHYSICAL EXAM: Vital signs: Vitals:   01/20/20 0310 01/20/20 0406 01/20/20 0734 01/20/20 1201  BP: (!) 141/74 (!) 154/66 (!) 191/72 (!) 190/69  Pulse: 84 78 84 85  Resp: 11 12 17 19   Temp: 98.1 F (36.7 C) 97.8 F (36.6 C) 98.3 F (36.8 C) 97.7 F (36.5 C)  TempSrc: Oral Oral Oral Axillary  SpO2: 98% 99% 98% 100%  Weight:      Height:       Filed Weights   01/24/2020 0757 01/19/20 1051  Weight: 76.7 kg 68.3 kg   Body mass index is 25.06 kg/m.   Gen Exam:Alert awake-not in any distress HEENT:atraumatic, normocephalic Chest: B/L clear to auscultation anteriorly CVS:S1S2 regular Abdomen:soft non tender, non distended Extremities:no edema Neurology: Left facial droop+, chronic left-sided hemiparesis-unchanged Skin: no rash  I have personally reviewed following labs and imaging studies  LABORATORY DATA: CBC: Recent Labs  Lab 01/11/2020 1653  WBC 9.2  NEUTROABS 5.5  HGB 11.1*  HCT 37.6  MCV 79.7*  PLT 95*    Basic Metabolic  Panel: Recent Labs  Lab 01/07/2020 1653  NA 142  K 4.3  CL 102  CO2 26  GLUCOSE 90  BUN 15  CREATININE 1.10*  CALCIUM 9.9    GFR: Estimated Creatinine Clearance: 39.8 mL/min (A) (by C-G formula based on SCr of 1.1 mg/dL (H)).  Liver Function Tests: Recent Labs  Lab 01/03/2020 1653  AST 23  ALT 16  ALKPHOS 86  BILITOT 0.9  PROT 7.9  ALBUMIN 4.1   No results for input(s): LIPASE, AMYLASE in the last 168 hours. No results for input(s): AMMONIA in the last 168 hours.  Coagulation Profile: Recent Labs  Lab 01/13/2020 1653  INR 1.2    Cardiac Enzymes: No results for input(s): CKTOTAL, CKMB, CKMBINDEX, TROPONINI in the last 168 hours.  BNP (last 3 results) No results for input(s): PROBNP in the last 8760 hours.  Lipid Profile: Recent Labs    01/19/20 0445  CHOL 114  HDL 38*  LDLCALC 56  TRIG 99  CHOLHDL 3.0    Thyroid Function Tests: No results for input(s): TSH, T4TOTAL, FREET4, T3FREE, THYROIDAB in the last 72 hours.  Anemia Panel: No results for input(s): VITAMINB12, FOLATE, FERRITIN, TIBC, IRON, RETICCTPCT in the last 72 hours.  Urine analysis:    Component Value Date/Time   COLORURINE STRAW (A) 01/26/2020 2105   APPEARANCEUR CLEAR 01/14/2020 2105   LABSPEC 1.005 01/09/2020 2105   PHURINE 7.0 01/23/2020 2105   GLUCOSEU NEGATIVE 01/17/2020 2105   HGBUR SMALL (A) 01/30/2020 2105   BILIRUBINUR NEGATIVE 01/24/2020 2105   KETONESUR NEGATIVE 01/14/2020 2105   PROTEINUR NEGATIVE 01/23/2020 2105   UROBILINOGEN 0.2 10/28/2014 0830   NITRITE NEGATIVE 01/03/2020 2105   LEUKOCYTESUR NEGATIVE 01/02/2020 2105    Sepsis Labs: Lactic Acid, Venous No results found for: LATICACIDVEN  MICROBIOLOGY: Recent Results (from the past 240 hour(s))  SARS Coronavirus 2 by RT PCR (hospital order, performed in [place] hospital lab) Nasopharyngeal Nasopharyngeal Swab     Status: None   Collection Time: 01/03/2020  9:02 PM   Specimen: Nasopharyngeal Swab  Result Value  Ref Range Status   SARS Coronavirus 2 NEGATIVE NEGATIVE Final    Comment: (NOTE) SARS-CoV-2 target nucleic acids are NOT DETECTED.  The SARS-CoV-2 RNA is generally detectable in upper and lower respiratory specimens during the acute phase of infection. The lowest concentration of SARS-CoV-2 viral copies this assay can detect is 250 copies / mL. A negative result does not preclude SARS-CoV-2 infection and should not be used as the sole basis for treatment or other patient management decisions.  A negative result may occur with improper specimen collection / handling, submission of specimen other than nasopharyngeal swab, presence of viral mutation(s) within the areas targeted by this assay, and inadequate number of viral copies (<250 copies / mL). A negative  result must be combined with clinical observations, patient history, and epidemiological information.  Fact Sheet for Patients:   StrictlyIdeas.no  Fact Sheet for Healthcare Providers: BankingDealers.co.za  This test is not yet approved or  cleared by the [place] FDA and has been authorized for detection and/or diagnosis of SARS-CoV-2 by FDA under an Emergency Use Authorization (EUA).  This EUA will remain in effect (meaning this test can be used) for the duration of the COVID-19 declaration under Section 564(b)(1) of the Act, 21 U.S.C. section 360bbb-3(b)(1), unless the authorization is terminated or revoked sooner.  Performed at [facility], [place] [address], [place], [place] [address]     RADIOLOGY STUDIES/RESULTS: CT Angio Head W or Wo Contrast  Result Date: 01/13/2020 CLINICAL DATA:  Fall, left-sided deficit. EXAM: CT ANGIOGRAPHY HEAD AND NECK TECHNIQUE: Multidetector CT imaging of the head and neck was performed using the standard protocol during bolus administration of intravenous contrast. Multiplanar CT image reconstructions and MIPs were obtained  to evaluate the vascular anatomy. Carotid stenosis measurements (when applicable) are obtained utilizing NASCET criteria, using the distal internal carotid diameter as the denominator. CONTRAST:  171mL OMNIPAQUE IOHEXOL 350 MG/ML SOLN COMPARISON:  None. FINDINGS: CTA NECK FINDINGS SKELETON: There is no bony spinal canal stenosis. No lytic or blastic lesion. OTHER NECK: Normal pharynx, larynx and major salivary glands. No cervical lymphadenopathy. Unremarkable thyroid gland. UPPER CHEST: No pneumothorax or pleural effusion. No nodules or masses. AORTIC ARCH: There is mild calcific atherosclerosis of the aortic arch. There is no aneurysm, dissection or hemodynamically significant stenosis of the visualized portion of the aorta. Conventional 3 vessel aortic branching pattern. The visualized proximal subclavian arteries are widely patent. RIGHT CAROTID SYSTEM: Normal without aneurysm, dissection or stenosis. LEFT CAROTID SYSTEM: No dissection, occlusion or aneurysm. Mild atherosclerotic calcification at the carotid bifurcation without hemodynamically significant stenosis. VERTEBRAL ARTERIES: Right dominant configuration. Both origins are clearly patent. There is no dissection, occlusion or flow-limiting stenosis to the skull base (V1-V3 segments). CTA HEAD FINDINGS POSTERIOR CIRCULATION: --Vertebral arteries: Moderate narrowing of the left V4 segment due to atherosclerotic calcification. Normal right V4 segment. --Inferior cerebellar arteries: Normal. --Basilar artery: Normal. --Superior cerebellar arteries: Normal. --Posterior cerebral arteries (PCA): Normal. 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 --Anterior cerebral arteries (ACA): Normal. Both A1 segments are present. Patent anterior communicating artery (a-comm). --Middle cerebral arteries (MCA): Normal. VENOUS SINUSES: As permitted by contrast timing, patent. ANATOMIC VARIANTS:  Fetal origins of both posterior cerebral arteries. Review of the MIP images confirms the above findings. IMPRESSION: 1. No emergent large vessel occlusion or high-grade stenosis of the intracranial arteries. 2. 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 3. Moderate narrowing of the left vertebral artery V4 segment due to atherosclerotic calcification. 4. Aortic Atherosclerosis (ICD10-I70.0). Electronically Signed   By: Ulyses Jarred M.D.   On: 01/29/2020 23:24   DG Lumbar Spine Complete  Result Date: 01/24/2020 CLINICAL DATA:  Fall with low back pain EXAM: LUMBAR SPINE - COMPLETE 4+ VIEW COMPARISON:  10/24/2019 FINDINGS: Alignment within normal limits. Vertebral body heights are grossly maintained. Mild degenerative changes most evident at L5-S1. Mild facet degenerative change at the lower lumbar spine. Aortic atherosclerosis. IMPRESSION: No acute osseous abnormality. Electronically Signed   By: Donavan Foil M.D.   On: 01/06/2020 20:20   CT HEAD WO CONTRAST  Result Date: 01/08/2020 CLINICAL DATA:  Fall EXAM: CT HEAD WITHOUT CONTRAST TECHNIQUE: Contiguous axial images were  obtained from the base of the skull through the vertex without intravenous contrast. COMPARISON:  CT 06/08/2018 FINDINGS: Brain: Stable regions of encephalomalacia throughout the right frontal parietal and occipital lobe. Few thin cortical calcifications along the right frontal lobe likely reflecting sequela of prior hemorrhage. New areas of cortically based hypoattenuation are seen involving the anterosuperior left frontal lobe. These are age indeterminate, favor chronic though new since December of 2019. No evidence of hemorrhage, hydrocephalus, extra-axial collection or mass lesion/mass effect. Symmetric prominence of the ventricles, cisterns and sulci compatible with parenchymal volume loss. Patchy areas of white matter hypoattenuation are most compatible with chronic microvascular angiopathy. Vascular: Intracranial  atherosclerosis. Redemonstration of the supraclinoid left ICA aneurysm without evidence of acute hemorrhage or rupture measuring up to 9 mm in diameter. Slight dolichoectasia of the right supraclinoid ICA as well. No abnormal hyperdense vessel. Skull: No calvarial fracture or suspicious osseous lesion. No scalp swelling or hematoma. Sinuses/Orbits: Paranasal sinuses and mastoid air cells are predominantly clear. Included orbital structures are unremarkable. Other: Debris in the external auditory canals. IMPRESSION: 1. New areas of cortically based hypoattenuation involving the anterosuperior left frontal lobe. Age indeterminate though favor chronic though new since December of 2019. If there is concern for acute ischemia, MRI could be obtained for further evaluation. 2. Additional stable regions of encephalomalacia throughout the right frontal parietal and occipital lobe compatible with prior infarct and remote right frontal lobe hemorrhage. 3. Redemonstration of the supraclinoid left ICA aneurysm without evidence of acute hemorrhage or rupture measuring up to 9 mm in diameter. 4. Slight dolichoectasia of the right supraclinoid ICA as well. Electronically Signed   By: Lovena Le M.D.   On: 01/08/2020 18:29   CT Angio Neck W and/or Wo Contrast  Result Date: 01/01/2020 CLINICAL DATA:  Fall, left-sided deficit. EXAM: CT ANGIOGRAPHY HEAD AND NECK TECHNIQUE: Multidetector CT imaging of the head and neck was performed using the standard protocol during bolus administration of intravenous contrast. Multiplanar CT image reconstructions and MIPs were obtained to evaluate the vascular anatomy. Carotid stenosis measurements (when applicable) are obtained utilizing NASCET criteria, using the distal internal carotid diameter as the denominator. CONTRAST:  134mL OMNIPAQUE IOHEXOL 350 MG/ML SOLN COMPARISON:  None. FINDINGS: CTA NECK FINDINGS SKELETON: There is no bony spinal canal stenosis. No lytic or blastic lesion. OTHER  NECK: Normal pharynx, larynx and major salivary glands. No cervical lymphadenopathy. Unremarkable thyroid gland. UPPER CHEST: No pneumothorax or pleural effusion. No nodules or masses. AORTIC ARCH: There is mild calcific atherosclerosis of the aortic arch. There is no aneurysm, dissection or hemodynamically significant stenosis of the visualized portion of the aorta. Conventional 3 vessel aortic branching pattern. The visualized proximal subclavian arteries are widely patent. RIGHT CAROTID SYSTEM: Normal without aneurysm, dissection or stenosis. LEFT CAROTID SYSTEM: No dissection, occlusion or aneurysm. Mild atherosclerotic calcification at the carotid bifurcation without hemodynamically significant stenosis. VERTEBRAL ARTERIES: Right dominant configuration. Both origins are clearly patent. There is no dissection, occlusion or flow-limiting stenosis to the skull base (V1-V3 segments). CTA HEAD FINDINGS POSTERIOR CIRCULATION: --Vertebral arteries: Moderate narrowing of the left V4 segment due to atherosclerotic calcification. Normal right V4 segment. --Inferior cerebellar arteries: Normal. --Basilar artery: Normal. --Superior cerebellar arteries: Normal. --Posterior cerebral arteries (PCA): Normal. 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 --Anterior cerebral arteries (ACA): Normal. Both A1 segments are present. Patent anterior communicating artery (a-comm). --Middle cerebral arteries (MCA): Normal. VENOUS SINUSES: As permitted by contrast timing, patent. ANATOMIC  VARIANTS: Fetal origins of both posterior cerebral arteries. Review of the MIP images confirms the above findings. IMPRESSION: 1. No emergent large vessel occlusion or high-grade stenosis of the intracranial arteries. 2. 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 3. Moderate narrowing of the left vertebral artery V4 segment due to atherosclerotic  calcification. 4. Aortic Atherosclerosis (ICD10-I70.0). Electronically Signed   By: Ulyses Jarred M.D.   On: 01/04/2020 23:24   MR BRAIN WO CONTRAST  Result Date: 01/17/2020 CLINICAL DATA:  Left-sided neurologic deficits.  Recent fall. EXAM: MRI HEAD WITHOUT CONTRAST TECHNIQUE: Multiplanar, multiecho pulse sequences of the brain and surrounding structures were obtained without intravenous contrast. COMPARISON:  Head CT 01/23/2020 FINDINGS: Brain: Multiple punctate foci abnormal diffusion restriction, predominantly within the right hemisphere in a watershed distribution. On the left, there are small foci of diffusion restriction in the left temporal lobe and left frontal white matter. Diffuse confluent hyperintense T2-weighted signal within the periventricular, deep and juxtacortical white matter, most commonly due to chronic ischemic microangiopathy. There are multiple old infarcts within both hemispheres, right worse than left. There is generalized atrophy without lobar predilection. Normal midline structures. Hemosiderin deposition nodes are much of the right hemisphere. No acute hemorrhage. No midline shift or other mass effect. Vascular: There is flow voids are maintained. 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 Skull and upper cervical spine: Normal marrow signal. Sinuses/Orbits: Negative. Other: None IMPRESSION: 1. Multiple (3-5) punctate foci of abnormal diffusion restriction, predominantly within the right hemisphere in a watershed distribution. 2. Findings of chronic ischemic microangiopathy and generalized atrophy with multiple old infarcts. Electronically Signed   By: Ulyses Jarred M.D.   On: 01/26/2020 19:38   ECHOCARDIOGRAM COMPLETE  Result Date: 01/19/2020    ECHOCARDIOGRAM REPORT   Patient Name:   Andrea Keller Date of Exam: 01/19/2020 Medical Rec #:  [id number]       Height:       65.0 in Accession #:    [id number]      Weight:       169.0 lb Date of Birth:   Jul 19, 1945        BSA:          1.841 m Patient Age:    26 years        BP:           152/61 mmHg Patient Gender: F               HR:           62 bpm. Exam Location:  Inpatient Procedure: 2D Echo, Color Doppler and Cardiac Doppler Indications:     Stroke i163.9  History:         Patient has prior history of Echocardiogram examinations, most                  recent 09/13/2015. Risk Factors:Hypertension and Dyslipidemia.  Sonographer:     Raquel Sarna Senior RDCS Referring Phys:  [place] Diagnosing Phys: Eleonore Chiquito MD IMPRESSIONS  1. Left ventricular ejection fraction, by estimation, is 65 to 70%. The left ventricle has normal function. The left ventricle has no regional wall motion abnormalities. Left ventricular diastolic parameters are consistent with Grade I diastolic dysfunction (impaired relaxation).  2. Right ventricular systolic function is normal. The right ventricular size is normal. Tricuspid regurgitation signal is inadequate for assessing PA pressure.  3. The mitral valve is grossly normal. Mild mitral valve regurgitation. No evidence of  mitral stenosis.  4. The aortic valve is moderately calcified (predominant leaflet tip inovlement) with eccentric AI that is directed toward the mitral valve. The regurgitation is mild. Similar in description to the study from 2017 but unable to view images. The aortic valve is tricuspid. Aortic valve regurgitation is mild. Mild to moderate aortic valve sclerosis/calcification is present, without any evidence of aortic stenosis.  5. The inferior vena cava is dilated in size with <50% respiratory variability, suggesting right atrial pressure of 15 mmHg. Comparison(s): No significant change from prior study. Conclusion(s)/Recommendation(s): No intracardiac source of embolism detected on this transthoracic study. A transesophageal echocardiogram is recommended to exclude cardiac source of embolism if clinically indicated. FINDINGS  Left Ventricle: Left ventricular  ejection fraction, by estimation, is 65 to 70%. The left ventricle has normal function. The left ventricle has no regional wall motion abnormalities. The left ventricular internal cavity size was normal in size. There is  no left ventricular hypertrophy. Left ventricular diastolic parameters are consistent with Grade I diastolic dysfunction (impaired relaxation). Normal left ventricular filling pressure. Right Ventricle: The right ventricular size is normal. No increase in right ventricular wall thickness. Right ventricular systolic function is normal. Tricuspid regurgitation signal is inadequate for assessing PA pressure. Left Atrium: Left atrial size was normal in size. Right Atrium: Right atrial size was normal in size. Pericardium: Trivial pericardial effusion is present. Mitral Valve: The mitral valve is grossly normal. Mild mitral valve regurgitation. No evidence of mitral valve stenosis. Tricuspid Valve: The tricuspid valve is grossly normal. Tricuspid valve regurgitation is trivial. No evidence of tricuspid stenosis. Aortic Valve: The aortic valve is moderately calcified (predominant leaflet tip inovlement) with eccentric AI that is directed toward the mitral valve. The regurgitation is mild. Similar in description to the study from 2017 but unable to view images. The aortic valve is tricuspid. . There is moderate thickening and moderate calcification of the aortic valve. Aortic valve regurgitation is mild. Aortic regurgitation PHT measures 572 msec. Mild to moderate aortic valve sclerosis/calcification is present, without any evidence of aortic stenosis. There is moderate thickening of the aortic valve. There is moderate calcification of the aortic valve. Pulmonic Valve: The pulmonic valve was grossly normal. Pulmonic valve regurgitation is not visualized. No evidence of pulmonic stenosis. Aorta: The aortic root and ascending aorta are structurally normal, with no evidence of dilitation. Venous: The  inferior vena cava is dilated in size with less than 50% respiratory variability, suggesting right atrial pressure of 15 mmHg. IAS/Shunts: The atrial septum is grossly normal.  LEFT VENTRICLE PLAX 2D LVIDd:         3.70 cm  Diastology LVIDs:         2.50 cm  LV e' lateral:   7.83 cm/s LV PW:         1.10 cm  LV E/e' lateral: 8.8 LV IVS:        1.10 cm  LV e' medial:    5.22 cm/s LVOT diam:     1.90 cm  LV E/e' medial:  13.1 LV SV:         79 LV SV Index:   43 LVOT Area:     2.84 cm  RIGHT VENTRICLE RV S prime:     14.80 cm/s TAPSE (M-mode): 2.1 cm LEFT ATRIUM             Index       RIGHT ATRIUM           Index LA diam:  2.80 cm 1.52 cm/m  RA Area:     13.30 cm LA Vol (A2C):   63.4 ml 34.43 ml/m RA Volume:   29.00 ml  15.75 ml/m LA Vol (A4C):   31.7 ml 17.21 ml/m LA Biplane Vol: 45.1 ml 24.49 ml/m  AORTIC VALVE LVOT Vmax:   131.00 cm/s LVOT Vmean:  78.600 cm/s LVOT VTI:    0.278 m AI PHT:      572 msec  AORTA Ao Root diam: 3.60 cm Ao Asc diam:  3.10 cm MITRAL VALVE MV Area (PHT): 2.60 cm    SHUNTS MV Decel Time: 292 msec    Systemic VTI:  0.28 m MV E velocity: 68.60 cm/s  Systemic Diam: 1.90 cm MV A velocity: 98.10 cm/s MV E/A ratio:  0.70 Eleonore Chiquito MD Electronically signed by Eleonore Chiquito MD Signature Date/Time: 01/19/2020/12:04:40 PM    Final (Updated)      LOS: 2 days   Oren Binet, MD  [facility]    To contact the attending provider between 7A-7P or the covering provider during after hours 7P-7A, please log into the web site [email or web address] and access using universal [place] password for that web site. If you do not have the password, please call the hospital operator.  01/20/2020, 2:16 PM

## 2020-01-20 NOTE — Plan of Care (Signed)
  Problem: Education: Goal: Knowledge of secondary prevention will improve Outcome: Progressing Goal: Knowledge of patient specific risk factors addressed and post discharge goals established will improve Outcome: Progressing Goal: Individualized Educational Video(s) Outcome: Progressing   Problem: Education: Goal: Knowledge of General Education information will improve Description: Including pain rating scale, medication(s)/side effects and non-pharmacologic comfort measures Outcome: Progressing   Problem: Health Behavior/Discharge Planning: Goal: Ability to manage health-related needs will improve Outcome: Progressing   Problem: Clinical Measurements: Goal: Ability to maintain clinical measurements within normal limits will improve Outcome: Progressing Goal: Will remain free from infection Outcome: Progressing Goal: Diagnostic test results will improve Outcome: Progressing Goal: Respiratory complications will improve Outcome: Progressing Goal: Cardiovascular complication will be avoided Outcome: Progressing   Problem: Activity: Goal: Risk for activity intolerance will decrease Outcome: Progressing   Problem: Nutrition: Goal: Adequate nutrition will be maintained Outcome: Progressing   Problem: Coping: Goal: Level of anxiety will decrease Outcome: Progressing   Problem: Elimination: Goal: Will not experience complications related to bowel motility Outcome: Progressing Goal: Will not experience complications related to urinary retention Outcome: Progressing   Problem: Pain Managment: Goal: General experience of comfort will improve Outcome: Progressing   Problem: Safety: Goal: Ability to remain free from injury will improve Outcome: Progressing   Problem: Skin Integrity: Goal: Risk for impaired skin integrity will decrease Outcome: Progressing   

## 2020-01-20 NOTE — Progress Notes (Signed)
Inpatient Rehabilitation Admissions Coordinator  I met with patient and her spouse at bedside We discussed goals and expectations of a possible CIR admit. Spouse states patient was ambulatory pta but he had to assist with all her adls. He is able to provide 24/7 assist and would like me to pursue CIR admit.I will begin insurance authorization with [place] for it is primary to [place] per the pre service center.  Danne Baxter, RN, MSN Rehab Admissions Coordinator [phone] 01/20/2020 3:27 PM

## 2020-01-20 NOTE — Plan of Care (Signed)
  Problem: Education: Goal: Knowledge of secondary prevention will improve Outcome: Progressing Goal: Knowledge of patient specific risk factors addressed and post discharge goals established will improve Outcome: Progressing   Problem: Education: Goal: Knowledge of General Education information will improve Description: Including pain rating scale, medication(s)/side effects and non-pharmacologic comfort measures Outcome: Progressing   Problem: Health Behavior/Discharge Planning: Goal: Ability to manage health-related needs will improve Outcome: Progressing   Problem: Clinical Measurements: Goal: Will remain free from infection Outcome: Progressing Goal: Diagnostic test results will improve Outcome: Progressing

## 2020-01-21 NOTE — Progress Notes (Signed)
Pt with trigeminy pvcs. Provider Ghimire made aware. No further orders. Pt asymptomatic. Will continue to monitor pt.

## 2020-01-21 NOTE — Progress Notes (Signed)
Dr. Tonie Griffith made aware of this nurses previous note/assessment. No new orders received at this time.

## 2020-01-21 NOTE — TOC Progression Note (Signed)
Transition of Care [facility]) - Progression Note    Patient Details  Name: Andrea Keller MRN: [id number] Date of Birth: [birth year]  Transition of Care [facility]) CM/SW [place], LCSW Phone Number: 01/21/2020, 11:23 AM  Clinical Narrative:    CSW spoke with patient's spouse to discuss Discharge plan. Due to CIR likely not hearing back for a few days from insurance, CSW explored possibility of sending patient to SNF instead. Spouse reported that he prefers CIR but will speak with his daughter who will arrive tomorrow morning. He is in agreement with SNF if CIR is denied. CSW will complete SNF workup.   Expected Discharge Plan: IP Rehab Facility Barriers to Discharge: Insurance Authorization  Expected Discharge Plan and Services Expected Discharge Plan: [place]   Discharge Planning Services: CM Consult Post Acute Care Choice: IP Rehab Living arrangements for the past 2 months: Single Family Home                                       Social Determinants of Health (SDOH) Interventions    Readmission Risk Interventions No flowsheet data found.

## 2020-01-21 NOTE — TOC Initial Note (Addendum)
Transition of Care [facility]) - Initial/Assessment Note    Patient Details  Name: Andrea Keller MRN: [id number] Date of Birth: 12-Sep-1945  Transition of Care [facility]) CM/SW Contact:    Verdell Carmine, RN Phone Number: 01/21/2020, 9:01 AM  Clinical Narrative:                 Patient on day 2 on admit, CVA at home does ambulated, husband helps perform ADLS. Will be going to CIR as recommended by PT/OT, however they project that authorization will take through Monday.  Patient is medically ready to be transferred to inpatient rehabilitation. Awaiting authorization. PT/OT will continue to follow. It is noted that husband can provide 24/7 assistance for patient on return home.   Expected Discharge Plan: IP Rehab Facility Barriers to Discharge: Insurance Authorization   Patient Goals and CMS Choice Patient states their goals for this hospitalization and ongoing recovery are:: Rehabilitation and then go home      Expected Discharge Plan and Services Expected Discharge Plan: [place]   Discharge Planning Services: CM Consult Post Acute Care Choice: IP Rehab Living arrangements for the past 2 months: Single Family Home                                      Prior Living Arrangements/Services Living arrangements for the past 2 months: Single Family Home Lives with:: Spouse Patient language and need for interpreter reviewed:: Yes Do you feel safe going back to the place where you live?: Yes      Need for Family Participation in Patient Care: Yes (Comment) Care giver support system in place?: Yes (comment)   Criminal Activity/Legal Involvement Pertinent to Current Situation/Hospitalization: No - Comment as needed  Activities of Daily Living      Permission Sought/Granted      Share Information with NAME: Husband Mr Erker           Emotional Assessment Appearance:: Appears stated age       Alcohol / Substance Use: Not Applicable Psych Involvement: No  (comment)  Admission diagnosis:  Left-sided face pain [R51.9] Acute CVA (cerebrovascular accident) [facility]) [I63.9] Patient Active Problem List   Diagnosis Date Noted  . History of CVA with residual deficit   . Prediabetes   . Acute CVA (cerebrovascular accident) ([place]) 01/17/2020  . Cerebrovascular accident (CVA) due to embolism of cerebral artery ([place]) 12/16/2015  . Foot swelling 12/16/2015  . Palpitations 10/21/2015  . CVA (cerebral vascular accident) ([place]) 10/21/2015  . 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 ICH (intracerebral hemorrhage) ([place]) 09/14/2015  . Transient alteration of awareness   . Other vascular headache   . Aneurysm, cerebral, nonruptured   . Benign essential HTN   . Acute blood loss anemia   . Elevated troponin   . Essential hypertension   . HLD (hyperlipidemia)   . Malignant neoplasm of colon ([place])   . Stroke (cerebrum) ([place]) 09/11/2015  . Intraparenchymal hemorrhage of brain ([place]) 09/10/2015  . Microcytosis   . PVD (peripheral vascular disease) ([place])   . Hypertension   . 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 Thrombocytopenia ([place]) 11/08/2014  . Acute appendicitis 10/28/2014  . Perforated appendicitis 10/28/2014   PCP:  Haywood Pao, MD Pharmacy:   [facility] [place], [place] - [address] [place] [address] Phone: [phone] Fax: [phone]  Social Determinants of Health (SDOH) Interventions    Readmission Risk Interventions No flowsheet data found.

## 2020-01-21 NOTE — Plan of Care (Signed)
  Problem: Education: Goal: Knowledge of secondary prevention will improve 01/21/2020 1121 by Ethel Rana, RN Outcome: Progressing 01/21/2020 1120 by Ethel Rana, RN Outcome: Progressing

## 2020-01-21 NOTE — Progress Notes (Signed)
Patient was asleep at start of shift. Awake and talkative at this time. States she is in the hospital, can follow commands. Patient with left sided weakness and facial droop. Pulled up in bed and repositionedx2 assist.  When asked patient to follow finger from left to right and vice versa, unable to. When holding 3 fingers in front of patient, patient stated there were 15. Wen holding one finger, patient again stated fifteen. Noted PT's note that patients visual field was questionable as she was unable to identify objects and did not see bed rail for reaching. VSS. SR on telemetry. This nurse does not have baseline to compare assessment to, so provider paged to make aware. Awaiting response.

## 2020-01-21 NOTE — Progress Notes (Signed)
Physical Therapy Treatment Patient Details Name: Andrea Keller MRN: [id number] DOB: 08/09/1945 Today's Date: 01/21/2020    History of Present Illness 74 y.o. 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 She presented to [facility] ED 01/26/2020 after several days of symptoms. An MRI was performed, which demonstrates multifocal areas of cortical ischemia. PMHx- HTN, OA, PVD, CVAs    PT Comments    Patient more confused than when evaluated on 01/20/20. She was slower to respond to requests and required incr assist to complete tasks. ?if her vision is more impaired as pt did not appear to see bed rail as assist to reach for rail (whether in her rt or lt field of vision). She could not properly identify objects in her field of vision.    Follow Up Recommendations  CIR     Equipment Recommendations  Other (comment) (TBA at next venue)    Recommendations for Other Services Rehab consult;OT consult;Speech consult     Precautions / Restrictions Precautions Precautions: Fall;Other (comment) Precaution Comments: goal SBP <180    Mobility  Bed Mobility Overal bed mobility: Needs Assistance Bed Mobility: Rolling Rolling: Mod assist Sidelying to sit: Mod assist;+2 for physical assistance       General bed mobility comments: rolling rt and lt for placing pad prior to sit EOB; max cues and facilitation to get pt to turn head and reach/grasp rail; incr assist to raise torso to sitting  Transfers Overall transfer level: Needs assistance Equipment used: 2 person hand held assist Transfers: Sit to/from [employer] Transfers Sit to Stand: Mod assist;+2 physical assistance; repeated x 2 Stand pivot transfers: Mod assist;+2 physical assistance       General transfer comment: attempted side-stepping from bed to chair and pt unable to advance either  LE  Ambulation/Gait             General Gait Details: unable; appears fearful   Stairs             Wheelchair Mobility    Modified Rankin (Stroke Patients Only) Modified Rankin (Stroke Patients Only) Pre-Morbid Rankin Score: Moderately severe disability Modified Rankin: Severe disability     Balance Overall balance assessment: Needs assistance Sitting-balance support: No upper extremity supported;Feet unsupported Sitting balance-Leahy Scale: Fair Sitting balance - Comments: maintains midline; with attempts to functionally use RUE begins to lean left   Standing balance support: Bilateral upper extremity supported Standing balance-Leahy Scale: Zero Standing balance comment: braces back of legs against bed frame; posterior and left lean with poorability to correct                            Cognition Arousal/Alertness: Awake/alert Behavior During Therapy: WFL for tasks assessed/performed Overall Cognitive Status: Impaired/Different from baseline Area of Impairment: Orientation;Attention;Memory;Following commands;Awareness;Problem solving;Safety/judgement                 Orientation Level: Disoriented to;Situation;Time;Place (July 74 ) Current Attention Level: Sustained Memory: Decreased short-term memory Following Commands: Follows one step commands inconsistently;Follows one step commands with increased time Safety/Judgement: Decreased awareness of safety;Decreased awareness of deficits Awareness: Intellectual Problem Solving: Slow processing;Difficulty sequencing;Requires verbal cues;Requires tactile cues General Comments: Not aware of deficits.  Reaching R arm up to touch therapists face consistently during session.  She is very sweet but confused.  Poor motor planning.  Not able to move  arm when asked but could spontaneously       Exercises      General Comments General comments (skin integrity, edema, etc.): Husband initially present and  left to go get his son      Pertinent Vitals/Pain Pain Assessment: No/denies pain    Home Living                      Prior Function            PT Goals (current goals can now be found in the care plan section) Acute Rehab PT Goals Patient Stated Goal: to walk Time For Goal Achievement: 02/03/20 Potential to Achieve Goals: Good Progress towards PT goals: Progressing toward goals    Frequency    Min 4X/week      PT Plan Current plan remains appropriate    Co-evaluation              AM-PAC PT "6 Clicks" Mobility   Outcome Measure  Help needed turning from your back to your side while in a flat bed without using bedrails?: A Lot Help needed moving from lying on your back to sitting on the side of a flat bed without using bedrails?: A Lot Help needed moving to and from a bed to a chair (including a wheelchair)?: A Lot Help needed standing up from a chair using your arms (e.g., wheelchair or bedside chair)?: A Lot Help needed to walk in hospital room?: Total Help needed climbing 3-5 steps with a railing? : Total 6 Click Score: 10    End of Session Equipment Utilized During Treatment: Gait belt Activity Tolerance: Other (comment) (limited by fear of falling) Patient left: with call bell/phone within reach;in chair;with chair alarm set Nurse Communication: Mobility status;Other (comment) (husband left) PT Visit Diagnosis: Other symptoms and signs involving the nervous system (R29.898);Hemiplegia and hemiparesis;Other abnormalities of gait and mobility (R26.89) Hemiplegia - Right/Left: Left Hemiplegia - dominant/non-dominant: Non-dominant Hemiplegia - caused by: Cerebral infarction     Time: 2979-8921 PT Time Calculation (min) (ACUTE ONLY): 29 min  Charges:  $Therapeutic Activity: 23-37 mins                      [name], PT Pager [phone]    [name] 01/21/2020, 5:32 PM

## 2020-01-21 NOTE — Progress Notes (Signed)
Discussed with pt's husband regarding bruising to pt. Pt with a bruise to her arm and lower leg. Per spouse " pt bruises easily". Explained to spouse that pt will bruise easier on the blood thinners that she has to take with her a. Fib. No further questions from spouse.

## 2020-01-21 NOTE — Plan of Care (Signed)
  Problem: Education: Goal: Knowledge of secondary prevention will improve Outcome: Progressing

## 2020-01-21 NOTE — Progress Notes (Signed)
PROGRESS NOTE        PATIENT DETAILS Name: Andrea Keller Age: 74 y.o. Sex: female Date of Birth: 1945/08/07 Admit Date: 01/09/2020 Admitting Physician Elwyn Reach, MD [id number], Fransico Him, MD  Brief Narrative: Patient is a 74 y.o. female with history of prior CVA-chronic left-sided hemiparesis-presenting with left facial droop-MRI brain with acute infarct.  Admitted to the hospitalist service for further evaluation and treatment.  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  Assessment/Plan: Acute CVA: Continues to have left-sided facial droop-but upper/lower extremity weakness appears to be at baseline.  Per neurology-likely tiny embolic infarcts-but given prior history of intracerebral hemorrhage-not a candidate for anticoagulation.  Recommendations are for aspirin/Plavix for 3 weeks followed by Plavix alone.  Remains on statin.  Evaluated by PT with recommendations for CIR  HLD: Continue statin   Diet: Diet Order            Diet Heart Room service appropriate? Yes; Fluid consistency: Thin  Diet effective now                  Code Status: Full code   Family Communication: None at bedside had updated family on 7/21-we will update over the next few days.  Disposition Plan: Status is: Inpatient  Remains inpatient  appropriate because:Inpatient level of care appropriate due to severity of illness   Dispo: The patient is from: Home              Anticipated d/c is to: CIR vs HHPT              Anticipated d/c date is: 1 day              Patient currently is medically stable to d/c.   Barriers to Discharge: Await CIR bed/insurance authorization  Antimicrobial agents: Anti-infectives (From admission, onward)   None       Time spent: 15- minutes-Greater than 50% of this time was spent in counseling, explanation of diagnosis, planning of further management, and coordination of care.  MEDICATIONS: Scheduled Meds: .  stroke: mapping our early stages of recovery book   Does not apply Once  . aspirin EC  81 mg Oral Daily  . clopidogrel  75 mg Oral Daily  . enoxaparin (LOVENOX) injection  40 mg Subcutaneous Q24H  . rosuvastatin  20 mg Oral Daily   Continuous Infusions: . sodium chloride 10 mL/hr at 01/21/20 0517   PRN Meds:.acetaminophen **OR** acetaminophen (TYLENOL) oral liquid 160 mg/5 mL **OR** acetaminophen, senna-docusate   PHYSICAL EXAM: Vital signs: Vitals:   01/21/20 0729 01/21/20 0946 01/21/20 1050 01/21/20 1200  BP: (!) 167/73 (!) 143/68 (!) 161/62 (!) 176/83  Pulse: 82 79 73 76  Resp: 18 18 12 14   Temp: 98 F (36.7 C)  98.2 F (36.8 C) 98.6  F (37 C)  TempSrc: Oral  Oral Oral  SpO2: 100% 100% 100% 100%  Weight:      Height:       Filed Weights   01/21/2020 0757 01/19/20 1051  Weight: 76.7 kg 68.3 kg   Body mass index is 25.06 kg/m.   Gen Exam:Alert awake-not in any distress HEENT:atraumatic, normocephalic Chest: B/L clear to auscultation anteriorly CVS:S1S2 regular Abdomen:soft non tender, non distended Extremities:no edema Neurology: Left facial droop+, chronic left-sided hemiparesis-unchanged Skin: no rash  I have personally reviewed following labs and imaging studies  LABORATORY DATA: CBC: Recent Labs  Lab 01/03/2020 1653  WBC 9.2  NEUTROABS 5.5  HGB  11.1*  HCT 37.6  MCV 79.7*  PLT 95*    Basic Metabolic Panel: Recent Labs  Lab 01/16/2020 1653  NA 142  K 4.3  CL 102  CO2 26  GLUCOSE 90  BUN 15  CREATININE 1.10*  CALCIUM 9.9    GFR: Estimated Creatinine Clearance: 39.8 mL/min (A) (by C-G formula based on SCr of 1.1 mg/dL (H)).  Liver Function Tests: Recent Labs  Lab 01/12/2020 1653  AST 23  ALT 16  ALKPHOS 86  BILITOT 0.9  PROT 7.9  ALBUMIN 4.1   No results for input(s): LIPASE, AMYLASE in the last 168 hours. No results for input(s): AMMONIA in the last 168 hours.  Coagulation Profile: Recent Labs  Lab 01/05/2020 1653  INR 1.2    Cardiac Enzymes: No results for input(s): CKTOTAL, CKMB, CKMBINDEX, TROPONINI in the last 168 hours.  BNP (last 3 results) No results for input(s): PROBNP in the last 8760 hours.  Lipid Profile: Recent Labs    01/19/20 0445  CHOL 114  HDL 38*  LDLCALC 56  TRIG 99  CHOLHDL 3.0    Thyroid Function Tests: No results for input(s): TSH, T4TOTAL, FREET4, T3FREE, THYROIDAB in the last 72 hours.  Anemia Panel: No results for input(s): VITAMINB12, FOLATE, FERRITIN, TIBC, IRON, RETICCTPCT in the last 72 hours.  Urine analysis:    Component Value Date/Time   COLORURINE STRAW (A) 01/09/2020 2105   APPEARANCEUR CLEAR 01/02/2020 2105   LABSPEC 1.005 01/08/2020 2105   PHURINE 7.0 01/24/2020 2105   GLUCOSEU NEGATIVE 01/21/2020 2105   HGBUR SMALL (A) 01/07/2020 2105   BILIRUBINUR NEGATIVE 01/22/2020 2105   KETONESUR NEGATIVE 01/19/2020 2105   PROTEINUR NEGATIVE 01/12/2020 2105   UROBILINOGEN 0.2 10/28/2014 0830   NITRITE NEGATIVE 01/21/2020 2105   LEUKOCYTESUR NEGATIVE 01/29/2020 2105    Sepsis Labs: Lactic Acid, Venous No results found for: LATICACIDVEN  MICROBIOLOGY: Recent Results (from the past 240 hour(s))  SARS Coronavirus 2 by RT PCR (hospital order, performed in [place] hospital lab) Nasopharyngeal Nasopharyngeal Swab     Status: None   Collection Time:  01/06/2020  9:02 PM   Specimen: Nasopharyngeal Swab  Result Value Ref Range Status   SARS Coronavirus 2 NEGATIVE NEGATIVE Final    Comment: (NOTE) SARS-CoV-2 target nucleic acids are NOT DETECTED.  The SARS-CoV-2 RNA is generally detectable in upper and lower respiratory specimens during the acute phase of infection. The lowest concentration of SARS-CoV-2 viral copies this assay can detect is 250 copies / mL. A negative result does not preclude SARS-CoV-2 infection and should not be used as the sole basis for treatment or other patient management decisions.  A negative result may occur with improper specimen collection / handling, submission of specimen other than nasopharyngeal swab, presence of viral mutation(s) within the areas targeted by this assay, and inadequate  number of viral copies (<250 copies / mL). A negative result must be combined with clinical observations, patient history, and epidemiological information.  Fact Sheet for Patients:   StrictlyIdeas.no  Fact Sheet for Healthcare Providers: BankingDealers.co.za  This test is not yet approved or  cleared by the [place] FDA and has been authorized for detection and/or diagnosis of SARS-CoV-2 by FDA under an Emergency Use Authorization (EUA).  This EUA will remain in effect (meaning this test can be used) for the duration of the COVID-19 declaration under Section 564(b)(1) of the Act, 21 U.S.C. section 360bbb-3(b)(1), unless the authorization is terminated or revoked sooner.  Performed at [facility], [place] [address] [place], [place] [address]     RADIOLOGY STUDIES/RESULTS: No results found.   LOS: 3 days   Oren Binet, MD  [facility]    To contact the attending provider between 7A-7P or the covering provider during after hours 7P-7A, please log into the web site [email or web address] and access using universal [place] password for  that web site. If you do not have the password, please call the hospital operator.  01/21/2020, 2:55 PM

## 2020-01-22 ENCOUNTER — Inpatient Hospital Stay (HOSPITAL_COMMUNITY): Payer: Medicare Other

## 2020-01-22 LAB — GLUCOSE, CAPILLARY
Glucose-Capillary: 93 mg/dL (ref 70–99)
Glucose-Capillary: 86 mg/dL (ref 70–99)
Glucose-Capillary: 60 mg/dL — ABNORMAL LOW (ref 70–99)
Glucose-Capillary: 69 mg/dL — ABNORMAL LOW (ref 70–99)
Glucose-Capillary: 81 mg/dL (ref 70–99)
Glucose-Capillary: 68 mg/dL — ABNORMAL LOW (ref 70–99)
Glucose-Capillary: 178 mg/dL — ABNORMAL HIGH (ref 70–99)

## 2020-01-22 LAB — SARS CORONAVIRUS 2 BY RT PCR (HOSPITAL ORDER, PERFORMED IN ~~LOC~~ HOSPITAL LAB): SARS Coronavirus 2: NEGATIVE

## 2020-01-22 MED ORDER — DEXTROSE 50 % IV SOLN
1.0000 | Freq: Once | INTRAVENOUS | Status: AC
  Administered 2020-01-22: 50 mL via INTRAVENOUS

## 2020-01-22 MED ORDER — DEXTROSE 50 % IV SOLN
INTRAVENOUS | Status: AC
  Filled 2020-01-22: qty 50

## 2020-01-22 NOTE — Progress Notes (Addendum)
Physical Therapy Treatment Patient Details Name: Andrea Keller MRN: [id number] DOB: 02-21-1946 Today's Date: 01/22/2020    History of Present Illness Pt is a 74 y.o. female with h/o multiple previous strokes (including likely hemorrhagic infarct in 2017 with residual left spastic hemiparesis) who presents 01/29/2020 with increased facial weakness and several days of symptoms. CT/MRI showed multiple punctate foci of abnormal diffusion restriction predominantly within R hemisphere in a watershed distribution; chronic ischemic microangiopathy, generalized atrophy with multiple old infarcts. PMH includes HTN, OA, PVD, CVAs.   PT Comments    Pt progressing with mobility. Pleasant and agreeable to participate. A&Ox0 this session, requiring assist to state her name as she kept repeating this PT's first name; then able to state husband and daughter's name. Pt requires mod-maxA for bed mobility. Unable to achieve fully upright standing this session despite maxA secondary to fearful of falling. Tolerated prolonged sitting EOB activity with min guard to supervision for safety. Recommend maximove lift for OOB transfers with nursing staff. Continue to recommend intensive CIR-level therapies to maximize functional mobility and independence prior to return home.   Follow Up Recommendations  CIR     Equipment Recommendations   (defer)    Recommendations for Other Services      Precautions / Restrictions Precautions Precautions: Fall;Other (comment) Precaution Comments: R-side inattention; SBP goal <180 Restrictions Weight Bearing Restrictions: No    Mobility  Bed Mobility Overal bed mobility: Needs Assistance Bed Mobility: Supine to Sit;Sit to Supine     Supine to sit: Mod assist Sit to supine: Max assist   General bed mobility comments: ModA to assist trunk elevation and scooting hips to EOB; maxA for return to supine and repositioning in bed. Pt attempting to bridge hips multiple times without  cues, modA to completely offload buttocks  Transfers Overall transfer level: Needs assistance Equipment used: 1 person hand held assist             General transfer comment: Attempted 3x sit<>stand from varying bed level height, pt actively resisting assist to stand, trialled providing assist from both R & L sides; initiation improved when assisting on L-side within field of vision; pt reports fear of falling and ultimately unable to stand or transfer OOB. Recommend maximove lift with nursing staff  Ambulation/Gait                 Stairs             Wheelchair Mobility    Modified Rankin (Stroke Patients Only) Modified Rankin (Stroke Patients Only) Pre-Morbid Rankin Score: Moderately severe disability Modified Rankin: Severe disability     Balance Overall balance assessment: Needs assistance Sitting-balance support: No upper extremity supported;Feet unsupported Sitting balance-Leahy Scale: Fair Sitting balance - Comments: Prolonged sitting EOB; good automatic anterior weight translation to scoot sideways or backwards, but posterior lean/actively resisting attempt to stand Postural control: Left lateral lean                                  Cognition Arousal/Alertness: Awake/alert Behavior During Therapy: WFL for tasks assessed/performed Overall Cognitive Status: No family/caregiver present to determine baseline cognitive functioning Area of Impairment: Orientation;Attention;Memory;Following commands;Awareness;Problem solving;Safety/judgement                 Orientation Level: Disoriented to;Person;Place;Time;Situation Current Attention Level: Focused;Sustained Memory: Decreased short-term memory Following Commands: Follows one step commands inconsistently;Follows one step commands with increased time Safety/Judgement: Decreased awareness of  safety;Decreased awareness of deficits Awareness: Intellectual Problem Solving: Slow  processing;Difficulty sequencing;Requires verbal cues;Requires tactile cues;Decreased initiation General Comments: Pt repeating back PT's name instead of stating her own name, reminded of her first name then able to state full name; able to state husband and daughter's name. Following simple commands <50% of session. Per nursing, cognition has waxed/waned throughout day. By end of session, pt with much less verbalizations and command following      Exercises Other Exercises Other Exercises: L elbow/wrist/digit PROM, pt able to actively move extremities, but limited doing so to command    General Comments General comments (skin integrity, edema, etc.): Husband initially present then left to visit with daughter downstairs      Pertinent Vitals/Pain Pain Assessment: Faces Faces Pain Scale: Hurts a little bit Pain Location: LUE with PROM Pain Descriptors / Indicators: Grimacing;Discomfort Pain Intervention(s): Monitored during session    Home Living                      Prior Function            PT Goals (current goals can now be found in the care plan section) Acute Rehab PT Goals Patient Stated Goal: None stated this session Progress towards PT goals: Progressing toward goals    Frequency    Min 4X/week      PT Plan Current plan remains appropriate    Co-evaluation              AM-PAC PT "6 Clicks" Mobility   Outcome Measure  Help needed turning from your back to your side while in a flat bed without using bedrails?: A Lot Help needed moving from lying on your back to sitting on the side of a flat bed without using bedrails?: A Lot Help needed moving to and from a bed to a chair (including a wheelchair)?: Total Help needed standing up from a chair using your arms (e.g., wheelchair or bedside chair)?: Total Help needed to walk in hospital room?: Total Help needed climbing 3-5 steps with a railing? : Total 6 Click Score: 8    End of Session Equipment  Utilized During Treatment: Gait belt Activity Tolerance: Other (comment) (limited by fear of falling) Patient left: in bed;with call bell/phone within reach;with bed alarm set Nurse Communication: Mobility status;Need for lift equipment PT Visit Diagnosis: Other symptoms and signs involving the nervous system (R29.898);Hemiplegia and hemiparesis;Other abnormalities of gait and mobility (R26.89) Hemiplegia - Right/Left: Left Hemiplegia - dominant/non-dominant: Non-dominant Hemiplegia - caused by: Cerebral infarction     Time: 3295-1884 PT Time Calculation (min) (ACUTE ONLY): 36 min  Charges:  $Therapeutic Activity: 23-37 mins                    Mabeline Caras, PT, DPT Acute Rehabilitation Services  Pager [phone] Office [place] 01/22/2020, 5:13 PM

## 2020-01-22 NOTE — Plan of Care (Signed)
°  Problem: Education: Goal: Knowledge of secondary prevention will improve Outcome: Not Progressing   Problem: Education: Goal: Knowledge of General Education information will improve Description: Including pain rating scale, medication(s)/side effects and non-pharmacologic comfort measures Outcome: Not Progressing

## 2020-01-22 NOTE — Progress Notes (Signed)
Hypoglycemic Event  CBG: 60  Treatment: One 4oz orange juice  Symptoms: Asymptomatic  Follow-up CBG: Time:1650 CBG Result:69  Possible Reasons for Event: Poor intake  Comments/MD notified:N/A    Vidal Lampkins  Kathlen [place]

## 2020-01-22 NOTE — Progress Notes (Signed)
Subjective: I was called because of transient worsening of her symptoms.  Nurse reports that she was completely flaccid on the left side on her initial evaluation earlier.  Due to this, a stat head CT was performed which does not show significant change.  Exam: Vitals:   01/22/20 1951 01/22/20 2000  BP: (!) 193/93 (!) 182/116  Pulse: 84 97  Resp: 21 19  Temp:  98.2 F (36.8 C)  SpO2: 100% 100%   Gen: In bed, NAD Resp: non-labored breathing, no acute distress Abd: soft, nt  Neuro: MS: Awake, confused, states it is the fifth month, has some perseveration on yes CN: Does not blink to threat from the right, appears to have a left gaze preference though she does cross midline to the right, left facial droop Motor: She has a left spastic hemiparesis, but is able to hold all extremities aloft, 4 -/5 in the left upper extremity 4/5 in the left lower extremity, lifts both right arm and leg well Sensory: She endorses sensation bilaterally   Impression: 74 year old female with history of right frontal ICH as well as recurrent ischemic events.  I am concerned that this represents another ischemic event, and I will order a repeat MRI, but I am not sure if this changes the risk-benefit calculation regarding anticoagulation.  Recommendations: 1) MRI brain 2) stroke team to follow  Roland Rack, MD [facility] [phone]  If 7pm- 7am, please page neurology on call as listed in [place].

## 2020-01-22 NOTE — Progress Notes (Signed)
No UOP documented for today. Noted 200cc tea colored urine in suction cannister, unknown from how long ago. Patient bladder scanned. 67ml noted. Monitoring.

## 2020-01-22 NOTE — TOC Progression Note (Addendum)
Transition of Care [facility]) - Progression Note    Patient Details  Name: Andrea Keller MRN: [id number] Date of Birth: October 13, 1945  Transition of Care [facility]) CM/SW Accident, LCSW Phone Number: 01/22/2020, 11:01 AM  Clinical Narrative:    11am-CSW received call from patient's spouse. He expressed concern due to hospital only allowing the same two visitors and his daughter is coming to town today and wants to see the patient. CSW explained that is hospital policy. He states he will call CSW when daughter arrives and we can meet outside to discuss discharge plan.   2:30pm-CSW met with patient's spouse, daughter, and brother in the lobby. CSW went over discharge options in the event that CIR is denied by insurance. CSW provided Medicare SNF list and described insurance approval process. CSW also answered questions about home health services. They requested a wheelchair if patient discharges home. Will continue to follow.   3:35pm-CSW received notice that insurance had denied CIR and MD peer to peer was also denied. Patient's spouse would like to proceed with SNF placement. CSW sent referral for review.  3:50pm-CSW spoke with patient's spouse and daughter. They are requesting [facility]. CSW spoke with Bethena Roys at [facility] and requested they review the referral. Patient has not received the COVID vaccines and will go intp 14 day isolation with no visitors at [facility]. Family aware.   4:30pm-[facility] able to accept patient Saturday pending insurance approval. Insurance clinicals sent for review. Weekend CSW to contact [place] at [phone]. COVID test ordered.    Expected Discharge Plan: IP Rehab Facility Barriers to Discharge: Insurance Authorization  Expected Discharge Plan and Services Expected Discharge Plan: [place]   Discharge Planning Services: CM Consult Post Acute Care Choice: IP Rehab Living arrangements for the past 2 months: Single Family Home                                        Social Determinants of Health (SDOH) Interventions    Readmission Risk Interventions No flowsheet data found.

## 2020-01-22 NOTE — Progress Notes (Signed)
Inpatient Rehabilitation Admissions Coordinator  I do not have a determination from [facility] for a possible CIR admit. I have spoken with pt's spouse and he is aware.LIkely will not hear before Monday.  Danne Baxter, RN, MSN Rehab Admissions Coordinator [phone] 01/22/2020 11:51 AM

## 2020-01-22 NOTE — NC FL2 (Signed)
[place] LEVEL OF CARE SCREENING TOOL     IDENTIFICATION  Patient Name: Andrea Keller Birthdate: 08/09/1945 Sex: female Admission Date (Current Location): 01/12/2020  [facility] and [place] Number:  Herbalist and Address:  [facility]. [facility], [place] [address], [place], [place] [address]      Provider Number: [id number]  Attending Physician Name and Address:  Jonetta Osgood, MD  Relative Name and Phone Number:  Jenny Reichmann, spouse, [phone]    Current Level of Care: Hospital Recommended Level of Care: [place] Prior Approval Number:    Date Approved/Denied:   PASRR Number: [id number]  Discharge Plan: SNF    Current Diagnoses: Patient Active Problem List   Diagnosis Date Noted  . History of CVA with residual deficit   . Prediabetes   . Acute CVA (cerebrovascular accident) ([place]) 01/20/2020  . Cerebrovascular accident (CVA) due to embolism of cerebral artery ([place]) 12/16/2015  . Foot swelling 12/16/2015  . Palpitations 10/21/2015  . CVA (cerebral vascular accident) ([place]) 10/21/2015  . 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 ICH (intracerebral hemorrhage) ([place]) 09/14/2015  . Transient alteration of awareness   . Other vascular headache   . Aneurysm, cerebral, nonruptured   . Benign essential HTN   . Acute blood loss anemia   . Elevated troponin   . Essential hypertension   . HLD (hyperlipidemia)   . Malignant neoplasm of colon ([place])   . Stroke (cerebrum) ([place]) 09/11/2015  . Intraparenchymal hemorrhage of brain ([place]) 09/10/2015  . Microcytosis   . PVD (peripheral vascular disease) ([place])   . Hypertension   . 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 Thrombocytopenia ([place]) 11/08/2014  . Acute appendicitis 10/28/2014  . Perforated appendicitis 10/28/2014    Orientation RESPIRATION BLADDER Height & Weight     Self  Normal Incontinent, External catheter Weight: 150 lb 9.2 oz (68.3 kg) Height:  5\' 5"   (165.1 cm)  BEHAVIORAL SYMPTOMS/MOOD NEUROLOGICAL BOWEL NUTRITION STATUS      Continent Diet (Please see DC Summary)  AMBULATORY STATUS COMMUNICATION OF NEEDS Skin   Extensive Assist Verbally Normal                       Personal Care Assistance Level of Assistance  Bathing, Feeding, Dressing Bathing Assistance: Maximum assistance Feeding assistance: Limited assistance Dressing Assistance: Maximum assistance     Functional Limitations Info  Sight, Hearing, Speech Sight Info: Adequate Hearing Info: Adequate Speech Info: Adequate    SPECIAL CARE FACTORS FREQUENCY  PT (By licensed PT), OT (By licensed OT)     PT Frequency: 5x/week OT Frequency: 5x/week            Contractures Contractures Info: Not present    Additional Factors Info  Code Status, Allergies Code Status Info: Full Allergies Info: Latex           Current Medications (01/22/2020):  This is the current hospital active medication list Current Facility-Administered Medications  Medication Dose Route Frequency Provider Last Rate Last Admin  .  stroke: mapping our early stages of recovery book   Does not apply Once Gala Romney L, MD      . 0.9 %  sodium chloride infusion   Intravenous Continuous Jonetta Osgood, MD 10 mL/hr at 01/21/20 0517 New Bag at 01/21/20 0517  . acetaminophen (TYLENOL) tablet 650 mg  650 mg Oral Q4H PRN Elwyn Reach, MD       Or  .  acetaminophen (TYLENOL) 160 MG/5ML solution 650 mg  650 mg Per Tube Q4H PRN Elwyn Reach, MD       Or  . acetaminophen (TYLENOL) suppository 650 mg  650 mg Rectal Q4H PRN Elwyn Reach, MD   650 mg at 01/20/20 0238  . aspirin EC tablet 81 mg  81 mg Oral Daily Burnetta Sabin L, NP   81 mg at 01/22/20 1100  . clopidogrel (PLAVIX) tablet 75 mg  75 mg Oral Daily Burnetta Sabin L, NP   75 mg at 01/22/20 1058  . enoxaparin (LOVENOX) injection 40 mg  40 mg Subcutaneous Q24H Jonetta Osgood, MD   40 mg at 01/21/20 1617  . rosuvastatin  (CRESTOR) tablet 20 mg  20 mg Oral Daily Burnetta Sabin L, NP   20 mg at 01/22/20 1100  . senna-docusate (Senokot-S) tablet 1 tablet  1 tablet Oral QHS PRN Elwyn Reach, MD   1 tablet at 01/20/20 1530     Discharge Medications: Please see discharge summary for a list of discharge medications.  Relevant Imaging Results:  Relevant Lab Results:   Additional Information SSN: [id number]  [place], [place]

## 2020-01-22 NOTE — Progress Notes (Signed)
Hypoglycemic Event  CBG: 68   Treatment: D50 50 mL (25 gm)  Symptoms: Vision changes  Follow-up CBG: Time:2034 CBG Result: 178  Possible Reasons for Event: Inadequate meal intake and Unknown  Comments/MD notified: RN was on phone with Dr.Chotiner ([facility] hospital[facility]) when hypoglycemic episode occurred for another issue.  Verbal orders obtained for glucose ampule.  Hospitalist is aware of situation.  Neurology/Dr.Kirkpatrick also aware of situation, neurology was notified due to worsening NHIS score.  MD was at bedside, and notified of drop in glucose and the interventions applied to patient.  Rennie Plowman

## 2020-01-22 NOTE — Progress Notes (Signed)
Patient back in room from CT and MRI on monitored bed, accompanied by RN.

## 2020-01-22 NOTE — Progress Notes (Signed)
PROGRESS NOTE        PATIENT DETAILS Name: Andrea Keller Age: 74 y.o. Sex: female Date of Birth: 1945-07-08 Admit Date: 01/04/2020 Admitting Physician Elwyn Reach, MD [id number], Fransico Him, MD  Brief Narrative: Patient is a 74 y.o. female with history of prior CVA-chronic left-sided hemiparesis-presenting with left facial droop-MRI brain with acute infarct.  Admitted to the hospitalist service for further evaluation and treatment.  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  Assessment/Plan: Acute CVA: Continues to have left-sided facial droop-but upper/lower extremity weakness appears to be at baseline.  Per neurology-likely tiny embolic infarcts-but given prior history of intracerebral hemorrhage-not a candidate for anticoagulation.  Recommendations are for aspirin/Plavix for 3 weeks followed by Plavix alone.  Remains on statin.  Evaluated by PT with recommendations for CIR-however denied by insurance-with recommendations to proceed to SNF.  Social worker/case management following will await further recommendations.  HLD: Continue statin  Diet: Diet Order            Diet Heart Room service appropriate? Yes; Fluid consistency: Thin  Diet effective now                  Code Status: Full code   Family Communication: None  at bedside had updated family on 7/21-we will update over the next few days.  Disposition Plan: Status is: Inpatient  Remains inpatient appropriate because:Inpatient level of care appropriate due to severity of illness  Dispo: The patient is from: Home              Anticipated d/c is to: CIR vs HHPT vs SNF              Anticipated d/c date is: 1 day              Patient currently is medically stable to d/c.   Barriers to Discharge: Awaiting safe disposition-denied by insurance to go to CIR-social work/CM will follow to determine SNF or home health.  Antimicrobial agents: Anti-infectives (From admission, onward)   None       Time spent: 15- minutes-Greater than 50% of this time was spent in counseling, explanation of diagnosis, planning of further management, and coordination of care.  MEDICATIONS: Scheduled Meds: .  stroke: mapping our early stages of recovery book   Does not apply Once  . aspirin EC  81 mg Oral Daily  . clopidogrel  75 mg Oral Daily  . enoxaparin (LOVENOX) injection  40 mg Subcutaneous Q24H  . rosuvastatin  20 mg Oral Daily   Continuous Infusions: . sodium chloride 10 mL/hr at 01/21/20 0517   PRN Meds:.acetaminophen **OR** acetaminophen (TYLENOL) oral liquid 160 mg/5 mL **OR** acetaminophen, senna-docusate   PHYSICAL EXAM: Vital signs: Vitals:   01/22/20 0415 01/22/20 0746 01/22/20 0800 01/22/20 1200  BP: Marland Kitchen)  153/75  (!) 143/68 (!) 141/109  Pulse: 84 72  78  Resp: 20 15  18   Temp: 97.8 F (36.6 C) 97.8 F (36.6 C)  98 F (36.7 C)  TempSrc: Oral Oral  Oral  SpO2: 100% 100% 100% 99%  Weight:      Height:       Filed Weights   01/08/2020 0757 01/19/20 1051  Weight: 76.7 kg 68.3 kg   Body mass index is 25.06 kg/m.   Gen Exam:Alert awake-not in any distress HEENT:atraumatic, normocephalic Chest: B/L clear to auscultation anteriorly CVS:S1S2 regular Abdomen:soft non tender, non distended Extremities:no edema Neurology: Left facial droop+,  chronic left-sided hemiparesis-unchanged Skin: no rash  I have personally reviewed following labs and imaging studies  LABORATORY DATA: CBC: Recent Labs  Lab 01/01/2020 1653  WBC 9.2  NEUTROABS 5.5  HGB 11.1*  HCT 37.6  MCV 79.7*  PLT 95*    Basic Metabolic Panel: Recent Labs  Lab 01/02/2020 1653  NA 142  K 4.3  CL 102  CO2 26  GLUCOSE 90  BUN 15  CREATININE 1.10*  CALCIUM 9.9    GFR: Estimated Creatinine Clearance: 39.8 mL/min (A) (by C-G formula based on SCr of 1.1 mg/dL (H)).  Liver Function Tests: Recent Labs  Lab 01/19/2020 1653  AST 23  ALT 16  ALKPHOS 86  BILITOT 0.9  PROT 7.9  ALBUMIN 4.1   No results for input(s): LIPASE, AMYLASE in the last 168 hours. No results for input(s): AMMONIA in the last 168 hours.  Coagulation Profile: Recent Labs  Lab 01/15/2020 1653  INR 1.2    Cardiac Enzymes: No results for input(s): CKTOTAL, CKMB, CKMBINDEX, TROPONINI in the last 168 hours.  BNP (last 3 results) No results for input(s): PROBNP in the last 8760 hours.  Lipid Profile: No results for input(s): CHOL, HDL, LDLCALC, TRIG, CHOLHDL, LDLDIRECT in the last 72 hours.  Thyroid Function Tests: No results for input(s): TSH, T4TOTAL, FREET4, T3FREE, THYROIDAB in the last 72 hours.  Anemia Panel: No results for input(s): VITAMINB12, FOLATE, FERRITIN, TIBC, IRON, RETICCTPCT in the last 72 hours.  Urine analysis:    Component Value Date/Time   COLORURINE STRAW (A) 01/02/2020 2105   APPEARANCEUR CLEAR 01/23/2020 2105   LABSPEC 1.005 01/23/2020 2105   PHURINE 7.0 01/10/2020 2105   GLUCOSEU NEGATIVE 01/20/2020 2105   HGBUR SMALL (A) 01/22/2020 2105   BILIRUBINUR NEGATIVE 01/17/2020 2105   KETONESUR NEGATIVE 01/17/2020 2105   PROTEINUR NEGATIVE 01/23/2020 2105   UROBILINOGEN 0.2 10/28/2014 0830   NITRITE NEGATIVE 01/26/2020 2105   LEUKOCYTESUR NEGATIVE 01/05/2020 2105    Sepsis Labs: Lactic Acid, Venous No results found for:  LATICACIDVEN  MICROBIOLOGY: Recent Results (from the past 240 hour(s))  SARS Coronavirus 2 by RT PCR (hospital order, performed in [place] hospital lab) Nasopharyngeal Nasopharyngeal Swab     Status: None   Collection Time: 01/17/2020  9:02 PM   Specimen: Nasopharyngeal Swab  Result Value Ref Range Status   SARS Coronavirus 2 NEGATIVE NEGATIVE Final    Comment: (NOTE) SARS-CoV-2 target nucleic acids are NOT DETECTED.  The SARS-CoV-2 RNA is generally detectable in upper and lower respiratory specimens during the acute phase of infection. The lowest concentration of SARS-CoV-2 viral copies this assay can detect is 250 copies / mL. A negative result does not preclude SARS-CoV-2 infection and should not be used as the sole basis for treatment or other patient management decisions.  A negative result may occur with improper specimen collection /  handling, submission of specimen other than nasopharyngeal swab, presence of viral mutation(s) within the areas targeted by this assay, and inadequate number of viral copies (<250 copies / mL). A negative result must be combined with clinical observations, patient history, and epidemiological information.  Fact Sheet for Patients:   StrictlyIdeas.no  Fact Sheet for Healthcare Providers: BankingDealers.co.za  This test is not yet approved or  cleared by the [place] FDA and has been authorized for detection and/or diagnosis of SARS-CoV-2 by FDA under an Emergency Use Authorization (EUA).  This EUA will remain in effect (meaning this test can be used) for the duration of the COVID-19 declaration under Section 564(b)(1) of the Act, 21 U.S.C. section 360bbb-3(b)(1), unless the authorization is terminated or revoked sooner.  Performed at [facility], [place] [address], [place], [place] [address]     RADIOLOGY STUDIES/RESULTS: No results found.   LOS: 4 days   Oren Binet, MD  [facility]    To contact the attending provider between 7A-7P or the covering provider during after hours 7P-7A, please log into the web site [email or web address] and access using universal [place] password for that web site. If you do not have the password, please call the hospital operator.  01/22/2020, 3:08 PM

## 2020-01-22 NOTE — Progress Notes (Signed)
Occupational Therapy Treatment Patient Details Name: Andrea Keller MRN: [id number] DOB: [birth year] Today's Date: 01/22/2020    History of present illness 74 y.o. 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 She presented to [facility] ED 01/19/2020 after several days of symptoms. An MRI was performed, which demonstrates multifocal areas of cortical ischemia. PMHx- HTN, OA, PVD, CVAs   OT comments  Patient continues to be confused but very sweet.  Having trouble assessing vision as she has difficulty answering questions.  She is able to make eye contact if cued.  When asked what color therapist's purple clothes were she responded "green".  Unclear if this is due to cognition or vision.  Completed full body bath this session with total assist at bed level, but able to roll with mod assist.  Patient following simple commands better than previous session.  Worked on PROM and AROM with L UE.  She is not able to initiate L UE movement on command as opposed to only spontaneously.  Will continue to follow with OT acutely to address the deficits listed below.    Follow Up Recommendations  CIR;Supervision/Assistance - 24 hour    Equipment Recommendations  Other (comment) (defer to next venue)    Recommendations for Other Services      Precautions / Restrictions Precautions Precautions: Fall;Other (comment) Precaution Comments: goal SBP <180       Mobility Bed Mobility Overal bed mobility: Needs Assistance Bed Mobility: Rolling Rolling: Mod assist         General bed mobility comments: Rolling right and left for bath and dressing  Transfers                 General transfer comment: did not attempt this session    Balance Overall balance assessment: Needs assistance                                         ADL either performed  or assessed with clinical judgement   ADL Overall ADL's : Needs assistance/impaired         Upper Body Bathing: Total assistance;Bed level   Lower Body Bathing: Total assistance;Bed level   Upper Body Dressing : Maximal assistance;Bed level   Lower Body Dressing: Total assistance;Bed level               Functional mobility during ADLs: Moderate assistance (rolling)       Vision       Perception     Praxis      Cognition Arousal/Alertness: Awake/alert Behavior During Therapy: WFL for tasks assessed/performed Overall Cognitive Status: Impaired/Different from baseline Area of Impairment: Orientation;Attention;Memory;Following commands;Awareness;Problem solving;Safety/judgement                 Orientation Level: Disoriented to;Situation;Time;Place Current Attention Level: Sustained Memory: Decreased short-term memory Following Commands: Follows one step commands inconsistently;Follows one step commands with increased time Safety/Judgement: Decreased awareness of safety;Decreased awareness of deficits Awareness: Intellectual Problem Solving: Slow processing;Difficulty sequencing;Requires verbal cues;Requires tactile cues General Comments: Following commands has improved to ~75% of trials.  Laughs at jokes and is very sweet, but still confused        Exercises Exercises: General Upper Extremity General Exercises - Upper Extremity Shoulder Flexion: PROM;AROM;Left;5 reps Elbow Extension: PROM;Left;5 reps Composite Extension: PROM;Left;10 reps  Shoulder Instructions       General Comments      Pertinent Vitals/ Pain       Pain Assessment: Faces Faces Pain Scale: Hurts little more Pain Location: L shoulder with rolling Pain Descriptors / Indicators: Grimacing;Discomfort Pain Intervention(s): Limited activity within patient's tolerance;Monitored during session;Repositioned  Home Living                                           Prior Functioning/Environment              Frequency  Min 2X/week        Progress Toward Goals  OT Goals(current goals can now be found in the care plan section)  Progress towards OT goals: Progressing toward goals  Acute Rehab OT Goals Patient Stated Goal: to walk OT Goal Formulation: With patient Time For Goal Achievement: 02/03/20 Potential to Achieve Goals: Good  Plan Discharge plan remains appropriate    Co-evaluation                 AM-PAC OT "6 Clicks" Daily Activity     Outcome Measure   Help from another person eating meals?: A Lot Help from another person taking care of personal grooming?: A Lot Help from another person toileting, which includes using toliet, bedpan, or urinal?: Total Help from another person bathing (including washing, rinsing, drying)?: Total Help from another person to put on and taking off regular upper body clothing?: A Lot Help from another person to put on and taking off regular lower body clothing?: Total 6 Click Score: 9    End of Session    OT Visit Diagnosis: Unsteadiness on feet (R26.81);Muscle weakness (generalized) (M62.81);Feeding difficulties (R63.3);Other symptoms and signs involving the nervous system (R29.898);Other symptoms and signs involving cognitive function;Hemiplegia and hemiparesis Hemiplegia - Right/Left: Left Hemiplegia - dominant/non-dominant: Non-Dominant   Activity Tolerance Patient tolerated treatment well   Patient Left in bed;with call bell/phone within reach;with bed alarm set   Nurse Communication Mobility status        Time: 0383-3383 OT Time Calculation (min): 31 min  Charges: OT General Charges $OT Visit: 1 Visit OT Treatments $Self Care/Home Management : 23-37 mins  August Luz, OTR/L    Phylliss Bob 01/22/2020, 11:15 AM

## 2020-01-22 NOTE — Progress Notes (Signed)
Inpatient Rehabilitation Admissions Coordinator  Insurance has denied request for Cir. I have informed TOC team and then spoke with spouse by phone. We will sign off at this time.  Danne Baxter, RN, MSN Rehab Admissions Coordinator [phone] 01/22/2020 3:20 PM

## 2020-01-22 NOTE — Progress Notes (Signed)
Hypoglycemic Event  CBG: 69  Treatment: One 4oz orange juice  Symptoms: Asymptomatic  Follow-up CBG: Time:1706 CBG Result:81  Possible Reasons for Event: poor intake  Comments/MD notified: N/A   Andrea Keller  Andrea Keller

## 2020-01-23 ENCOUNTER — Inpatient Hospital Stay (HOSPITAL_COMMUNITY): Payer: Medicare Other

## 2020-01-23 ENCOUNTER — Inpatient Hospital Stay (HOSPITAL_COMMUNITY): Payer: Medicare Other | Admitting: Registered Nurse

## 2020-01-23 DIAGNOSIS — I639 Cerebral infarction, unspecified: Secondary | ICD-10-CM

## 2020-01-23 DIAGNOSIS — R569 Unspecified convulsions: Secondary | ICD-10-CM

## 2020-01-23 DIAGNOSIS — J9601 Acute respiratory failure with hypoxia: Secondary | ICD-10-CM

## 2020-01-23 DIAGNOSIS — R400 Somnolence: Secondary | ICD-10-CM

## 2020-01-23 LAB — DIC (DISSEMINATED INTRAVASCULAR COAGULATION)PANEL
D-Dimer, Quant: 5.68 ug/mL-FEU — ABNORMAL HIGH (ref 0.00–0.50)
Fibrinogen: 570 mg/dL — ABNORMAL HIGH (ref 210–475)
INR: 1.3 — ABNORMAL HIGH (ref 0.8–1.2)
Platelets: 8 10*3/uL — CL (ref 150–400)
Prothrombin Time: 15.3 seconds — ABNORMAL HIGH (ref 11.4–15.2)
aPTT: 40 seconds — ABNORMAL HIGH (ref 24–36)

## 2020-01-23 LAB — POCT I-STAT 7, (LYTES, BLD GAS, ICA,H+H)
Acid-Base Excess: 1 mmol/L (ref 0.0–2.0)
Bicarbonate: 24.8 mmol/L (ref 20.0–28.0)
Calcium, Ion: 1.22 mmol/L (ref 1.15–1.40)
HCT: 32 % — ABNORMAL LOW (ref 36.0–46.0)
Hemoglobin: 10.9 g/dL — ABNORMAL LOW (ref 12.0–15.0)
O2 Saturation: 100 %
Patient temperature: 97.7
Potassium: 3.1 mmol/L — ABNORMAL LOW (ref 3.5–5.1)
Sodium: 144 mmol/L (ref 135–145)
TCO2: 26 mmol/L (ref 22–32)
pCO2 arterial: 35.6 mmHg (ref 32.0–48.0)
pH, Arterial: 7.449 (ref 7.350–7.450)
pO2, Arterial: 418 mmHg — ABNORMAL HIGH (ref 83.0–108.0)

## 2020-01-23 LAB — DIRECT ANTIGLOBULIN TEST (NOT AT ARMC)
DAT, IgG: NEGATIVE
DAT, complement: NEGATIVE

## 2020-01-23 LAB — BASIC METABOLIC PANEL
Anion gap: 11 (ref 5–15)
BUN: 28 mg/dL — ABNORMAL HIGH (ref 8–23)
CO2: 21 mmol/L — ABNORMAL LOW (ref 22–32)
Calcium: 9.5 mg/dL (ref 8.9–10.3)
Chloride: 112 mmol/L — ABNORMAL HIGH (ref 98–111)
Creatinine, Ser: 1.57 mg/dL — ABNORMAL HIGH (ref 0.44–1.00)
GFR calc Af Amer: 37 mL/min — ABNORMAL LOW (ref >60)
GFR calc non Af Amer: 32 mL/min — ABNORMAL LOW (ref >60)
Glucose, Bld: 112 mg/dL — ABNORMAL HIGH (ref 70–99)
Potassium: 3.6 mmol/L (ref 3.5–5.1)
Sodium: 144 mmol/L (ref 135–145)

## 2020-01-23 LAB — RETICULOCYTES
Immature Retic Fract: 27.4 % — ABNORMAL HIGH (ref 2.3–15.9)
RBC.: 3.38 MIL/uL — ABNORMAL LOW (ref 3.87–5.11)
Retic Count, Absolute: 130.1 10*3/uL (ref 19.0–186.0)
Retic Ct Pct: 3.9 % — ABNORMAL HIGH (ref 0.4–3.1)

## 2020-01-23 LAB — GLUCOSE, CAPILLARY
Glucose-Capillary: 95 mg/dL (ref 70–99)
Glucose-Capillary: 100 mg/dL — ABNORMAL HIGH (ref 70–99)
Glucose-Capillary: 139 mg/dL — ABNORMAL HIGH (ref 70–99)

## 2020-01-23 LAB — LACTATE DEHYDROGENASE
LDH: 972 U/L — ABNORMAL HIGH (ref 98–192)
LDH: 983 U/L — ABNORMAL HIGH (ref 98–192)

## 2020-01-23 LAB — SAVE SMEAR(SSMR), FOR PROVIDER SLIDE REVIEW

## 2020-01-23 LAB — BILIRUBIN, TOTAL: Total Bilirubin: 1.6 mg/dL — ABNORMAL HIGH (ref 0.3–1.2)

## 2020-01-23 MED ORDER — FENTANYL CITRATE (PF) 100 MCG/2ML IJ SOLN
INTRAMUSCULAR | Status: AC
  Filled 2020-01-23: qty 2

## 2020-01-23 MED ORDER — SODIUM CHLORIDE 0.9 % IV SOLN: 250.0000 mL | INTRAVENOUS | Status: DC | PRN

## 2020-01-23 MED ORDER — DOCUSATE SODIUM 50 MG/5ML PO LIQD: 100.0000 mg | Freq: Two times a day (BID) | ORAL | Status: DC

## 2020-01-23 MED ORDER — MIDAZOLAM HCL 2 MG/2ML IJ SOLN
INTRAMUSCULAR | Status: AC
  Filled 2020-01-23: qty 4

## 2020-01-23 MED ORDER — FENTANYL CITRATE (PF) 100 MCG/2ML IJ SOLN: 25.0000 ug | INTRAMUSCULAR | Status: DC | PRN

## 2020-01-23 MED ORDER — FENTANYL CITRATE (PF) 100 MCG/2ML IJ SOLN: 50.0000 ug | Freq: Once | INTRAMUSCULAR | Status: DC

## 2020-01-23 MED ORDER — LEVETIRACETAM IN NACL 1500 MG/100ML IV SOLN
1500.0000 mg | Freq: Two times a day (BID) | INTRAVENOUS | Status: DC
  Administered 2020-01-23 – 2020-01-24 (×2): 1500 mg via INTRAVENOUS
  Filled 2020-01-23 (×3): qty 100

## 2020-01-23 MED ORDER — SODIUM CHLORIDE 0.9% FLUSH: 3.0000 mL | INTRAVENOUS | Status: DC | PRN

## 2020-01-23 MED ORDER — ROCURONIUM BROMIDE 50 MG/5ML IV SOLN
50.0000 mg | Freq: Once | INTRAVENOUS | Status: DC
  Filled 2020-01-23: qty 5

## 2020-01-23 MED ORDER — AMLODIPINE BESYLATE 5 MG PO TABS
5.0000 mg | ORAL_TABLET | Freq: Every day | ORAL | Status: DC
  Administered 2020-01-23: 5 mg via ORAL
  Filled 2020-01-23: qty 1

## 2020-01-23 MED ORDER — SODIUM CHLORIDE 0.9% FLUSH
3.0000 mL | Freq: Two times a day (BID) | INTRAVENOUS | Status: DC
  Administered 2020-01-23 (×2): 3 mL via INTRAVENOUS

## 2020-01-23 MED ORDER — MIDAZOLAM HCL 2 MG/2ML IJ SOLN
INTRAMUSCULAR | Status: DC | PRN
Start: 2020-01-23 — End: 2020-01-23
  Administered 2020-01-23: 1 mg via INTRAVENOUS

## 2020-01-23 MED ORDER — PROPOFOL 1000 MG/100ML IV EMUL: 0.0000 ug/kg/min | INTRAVENOUS | Status: DC

## 2020-01-23 MED ORDER — CHLORHEXIDINE GLUCONATE CLOTH 2 % EX PADS
6.0000 | MEDICATED_PAD | Freq: Every day | CUTANEOUS | Status: DC
  Administered 2020-01-23: 6 via TOPICAL

## 2020-01-23 MED ORDER — FENTANYL CITRATE (PF) 100 MCG/2ML IJ SOLN
INTRAMUSCULAR | Status: DC | PRN
  Administered 2020-01-23: 50 ug via INTRAVENOUS

## 2020-01-23 MED ORDER — MIDAZOLAM HCL 2 MG/2ML IJ SOLN: 1.0000 mg | Freq: Once | INTRAMUSCULAR | Status: DC

## 2020-01-23 MED ORDER — POLYETHYLENE GLYCOL 3350 17 G PO PACK: 17.0000 g | PACK | Freq: Every day | ORAL | Status: DC

## 2020-01-23 MED ORDER — POTASSIUM CHLORIDE 20 MEQ/15ML (10%) PO SOLN
40.0000 meq | Freq: Once | ORAL | Status: AC
  Administered 2020-01-24: 40 meq
  Filled 2020-01-23: qty 30

## 2020-01-23 MED ORDER — ROCURONIUM BROMIDE 100 MG/10ML IV SOLN
INTRAVENOUS | Status: DC | PRN
  Administered 2020-01-23: 40 mg via INTRAVENOUS

## 2020-01-23 MED ORDER — PANTOPRAZOLE SODIUM 40 MG IV SOLR
40.0000 mg | INTRAVENOUS | Status: DC
  Administered 2020-01-24: 40 mg via INTRAVENOUS
  Filled 2020-01-23: qty 40

## 2020-01-23 MED ORDER — LORAZEPAM 2 MG/ML IJ SOLN
2.0000 mg | Freq: Once | INTRAMUSCULAR | Status: AC
  Administered 2020-01-23: 2 mg via INTRAVENOUS
  Filled 2020-01-23: qty 1

## 2020-01-23 MED ORDER — ETOMIDATE 2 MG/ML IV SOLN: 20.0000 mg | Freq: Once | INTRAVENOUS | Status: DC

## 2020-01-23 MED ORDER — ETOMIDATE 2 MG/ML IV SOLN
INTRAVENOUS | Status: DC | PRN
  Administered 2020-01-23: 10 mg via INTRAVENOUS

## 2020-01-23 MED FILL — Medication: Qty: 1 | Status: AC

## 2020-01-23 NOTE — Progress Notes (Signed)
[facility] Progress Note Patient Name: Andrea Keller DOB: 04-Feb-1946 MRN: [id number]   Date of Service  01/23/2020  HPI/Events of Note  Hypokalemia - K+ = 3.1 and Creatinine = 1.57 and 2. Thrombocytopenia - Platelet count = 8. Concern for TTP. Platelet transfusion can make TTP worse.   eICU Interventions  Plan: 1. Replace K+. 2. Will solicit further input from Hematology concerning the platelet count.       Intervention Category Major Interventions: Electrolyte abnormality - evaluation and management  Tannen Vandezande Eugene 01/23/2020, 11:52 PM

## 2020-01-23 NOTE — Anesthesia Procedure Notes (Signed)
Procedure Name: Intubation Date/Time: 01/23/2020 8:20 PM Performed by: Jearld Pies, CRNA Pre-anesthesia Checklist: Patient identified, Emergency Drugs available, Suction available, Patient being monitored and Timeout performed Patient Re-evaluated:Patient Re-evaluated prior to induction Oxygen Delivery Method: Ambu bag Preoxygenation: Pre-oxygenation with 100% oxygen Induction Type: IV induction, Cricoid Pressure applied and Rapid sequence Laryngoscope Size: Glidescope and 3 Grade View: Grade I Tube type: Oral Tube size: 7.5 mm Number of attempts: 1 Airway Equipment and Method: Stylet and Video-laryngoscopy Placement Confirmation: ETT inserted through vocal cords under direct vision,  breath sounds checked- equal and bilateral and CO2 detector Secured at: 23 cm Tube secured with: Tape (Via Carrie RT at bedside) Dental Injury: Teeth and Oropharynx as per pre-operative assessment

## 2020-01-23 NOTE — Consult Note (Signed)
NAMECHANTIA Keller, MRN:  [id number], DOB:  06-Dec-1945, LOS: 5 ADMISSION DATE:  01/15/2020, CONSULTATION DATE:  01/23/20 REFERRING MD:  Hospitalist, CHIEF COMPLAINT:  Respiratory arrest   Brief History   74 y.o. F admitted 7/19 with L facial droop and found to have subacute cortical areas of punctate ischemia.  Pt received 2mg  Ativan for repeat head CT and then was found unresponsive and hypoxic.  No loss of pulses, she was intubated and transferred to the intensive care unit.  History of present illness   Andrea Keller is a 74 y.o. F with PMH of prior CVA, HTN, PVD, OA who was admitted 7/19 with increased L facial weakness and found to have multiple R punctate infarcts likely embolic in watershed distribution.  CTA showed no LVO and Echo with EF 60-65% and no source of emboli.  She was treated with Diona Fanti and Plavix along with statin.  During hospital course she developed worsening thrombocytopenia and was seen by hematology.    The evening of 7/24 pt received 2mg  Ativan for repeat CT, had been at baseline neurologic status prior .  Shortly after, hematology consulted rounded on patient as peripheral smear showed numerous schistocytes, pt was found unresponsive with poor respirations and oxygen saturations in the 70%'s.  Rapid response was called and patient was intubated and transported to the intensive care unit.  There was no loss of pulses.  On arrival pt is unresponsive post paralytic for intubation.  Blood pressure and oxygen saturations are stable.  Family Goals of care discussion with palliative care is pending.  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   Significant Hospital Events   7/19 Admit to hospitalists 7/24 Respiratory and mental status decline prompting intubation and ICU  txfr  Consults:  Neurology Hematology Palliative  Procedures:  7/24 ETT  Significant Diagnostic Tests:  7/19 CT head>>New areas of cortically based hypoattenuation involving the anterosuperior left frontal lobe 7/19 MRI brain>>Multiple punctate foci abnormal diffusion restriction, predominantly within the right hemisphere in a watershed distribution 7/19 CTA head/neck>>No emergent large vessel occlusion or high-grade stenosis of the intracranial arteries. 7/23 MRI brain>>New, large area of acute ischemia in the posterior left MCA territory. No hemorrhage or mass effect. 7/24 CT head>>Stable appearance of left parietal infarct without hemorrhagic conversion. 7/24 CXR>>Low lung volumes with bibasilar atelectasis.   Micro Data:  7/19, 7/23 Sars-CoV-2>>negative  Antimicrobials:    Interim history/subjective:  Pt transferred to intensive care unit with stable vital signs  Objective   Blood pressure (!) 150/80, pulse 82, temperature 97.7 F (36.5 C), temperature source Axillary, resp. rate 15, height 5\' 2"  (1.575 m), weight 68.3 kg, SpO2 100 %.        Intake/Output Summary (Last 24 hours) at 01/23/2020 2045 Last data filed at 01/23/2020 4010 Gross per 24 hour  Intake 103 ml  Output --  Net 103 ml   Filed Weights   01/28/2020 0757 01/19/20 1051  Weight: 76.7 kg 68.3 kg    General:  Elderly F intubated and sedated HEENT: MM pink/moist, ETT in place  Neuro: unresponsive, pupils 46mm and minimally responsive CV: s1s2 rrr, no m/r/g PULM:  CTAB, on full vent support GI: soft, bsx4 active  Extremities: warm/dry, no edema  Skin: no rashes or lesions   Resolved Hospital Problem list     Assessment & Plan:  Acute Hypoxic Respiratory Failure Suspect this was from AMS and suppressed respiratory drive from Benzodiazepine -continue full vent support overnight with propofol and prn fentanyl, hopefully will return to respiratory and mental baseline after Ativan clears, if not  improving may need another repeat head CT to confirm no hemorrhagic conversion  --Maintain full vent support with SAT/SBT as tolerated -titrate Vent setting to maintain SpO2 greater than or equal to 90%. -HOB elevated 30 degrees. -Plateau pressures less than 30 cm H20.  -Follow chest x-ray, ABG prn.   -Bronchial hygiene and RT/bronchodilator protocol.  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  May need plasmapheresis, would need ADAMS13 panel prior to initiation  HTN -Monitor BP for now, if continues to be hypertensive plan was to initiate Amlodipine    Best practice:  Diet: NPO Pain/Anxiety/Delirium protocol (if indicated): Fentanyl/Propofol VAP protocol (if indicated): HOB 30 degrees, suction prn DVT prophylaxis: SCD's GI prophylaxis: protonix Glucose control: SSI Mobility: bed rest Code Status: full code Family Communication: spouse updated Disposition:   Labs   CBC: Recent Labs  Lab 01/13/2020 1653 01/23/20 1400  WBC 9.2 12.1*  NEUTROABS 5.5  --   HGB 11.1* 9.5*  HCT 37.6 31.0*  MCV 79.7* 75.8*  PLT 95* 16*    Basic Metabolic Panel: Recent Labs  Lab 01/13/2020 1653 01/23/20 1400  NA 142 144  K 4.3 3.6  CL 102 112*  CO2 26 21*  GLUCOSE 90 112*  BUN 15 28*  CREATININE 1.10* 1.57*  CALCIUM 9.9 9.5   GFR: Estimated Creatinine Clearance: 28.1 mL/min (A) (by C-G formula based on SCr of 1.57 mg/dL (H)). Recent Labs  Lab 01/29/2020 1653 01/23/20 1400  WBC 9.2 12.1*    Liver Function Tests: Recent Labs  Lab 01/02/2020 1653  AST 23  ALT 16  ALKPHOS 86  BILITOT 0.9  PROT 7.9  ALBUMIN 4.1   No results for input(s): LIPASE, AMYLASE in the last 168 hours. No results for input(s):  AMMONIA in the last 168 hours.  ABG    Component Value Date/Time   TCO2 19 10/28/2014 0856     Coagulation Profile: Recent Labs  Lab 12/31/2019 1653  INR 1.2    Cardiac Enzymes: No results for input(s): CKTOTAL, CKMB, CKMBINDEX, TROPONINI in the last 168 hours.  HbA1C: Hgb A1c MFr Bld  Date/Time Value Ref Range Status  01/19/2020 04:45 AM 6.0 (H) 4.8 - 5.6 % Final    Comment:    (NOTE) Pre diabetes:          5.7%-6.4%  Diabetes:              >6.4%  Glycemic control for   <7.0% adults with diabetes   09/11/2015 05:20 AM 6.0 (H) 4.8 - 5.6 % Final    Comment:    (NOTE)         Pre-diabetes: 5.7 - 6.4         Diabetes: >6.4         Glycemic control for adults with diabetes: <7.0     CBG: Recent Labs  Lab 01/22/20 1706 01/22/20 1948 01/22/20 2033 01/23/20 0228 01/23/20 0748  GLUCAP 81 68* 178* 95 100*    Review of Systems:   Unable to obtain secondary to mental status  Past Medical  History  She,  has a past medical history of Diverticulosis, 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   Surgical History    Past Surgical History:  Procedure Laterality Date  . ABDOMINAL HYSTERECTOMY  2009  . APPENDECTOMY  10/28/2014  . CYST REMOVAL NECK    . IR GENERIC HISTORICAL  02/20/2016   IR RADIOLOGIST EVAL & MGMT 02/20/2016 [facility]  . IR GENERIC HISTORICAL  06/13/2016   IR RADIOLOGIST EVAL & MGMT 06/13/2016 [facility]  . LAPAROSCOPIC APPENDECTOMY N/A 10/28/2014   Procedure: APPENDECTOMY LAPAROSCOPIC;  Surgeon: Erroll Luna, MD;  Location: [facility];  Service: General;  Laterality: N/A;     Social History   reports that she has quit smoking. She has never used smokeless tobacco. She reports that she does not drink alcohol and does not use drugs.   Family History   Her family history includes Alcohol abuse in an other family  member; Colon cancer in her brother; Diabetes in her mother; Lung cancer in her father; Transient ischemic attack in her son.   Allergies Allergies  Allergen Reactions  . Heparin Other (See Comments)    7/24 platelets to 19 on enoxaparin, pending HIT OD and SRA   . Latex Rash     Home Medications  Prior to Admission medications   Medication Sig Start Date End Date Taking? Authorizing Provider  acetaminophen (TYLENOL) 325 MG tablet Take 650 mg by mouth daily as needed for moderate pain.    Yes [provider]  aspirin EC 81 MG tablet Take 1 tablet (81 mg total) by mouth daily. 04/16/16  Yes Rosalin Hawking, MD  cholecalciferol (VITAMIN D3) 25 MCG (1000 UT) tablet Take 5,000 Units by mouth daily.   Yes [provider]  ferrous sulfate 325 (65 FE) MG tablet Take 1 tablet (325 mg total) by mouth daily with breakfast. 09/23/15  Yes Angiulli, Lavon Paganini, PA-C  furosemide (LASIX) 20 MG tablet Take 20 mg by mouth daily.  04/11/16  Yes [provider]  lidocaine (LIDODERM) 5 % Place 1 patch onto the skin daily as needed (back pain). Remove & Discard patch within 12 hours or as directed by MD   Yes [provider]  pantoprazole (PROTONIX) 40 MG tablet Take 1 tablet (40 mg total) by mouth daily at 12 noon. 09/23/15  Yes Angiulli, Lavon Paganini, PA-C  rosuvastatin (CRESTOR) 20 MG tablet Take 20 mg by mouth daily. 09/17/16  Yes [provider]  HYDROcodone-acetaminophen (NORCO/VICODIN) 5-325 MG tablet Take 1 tablet by mouth every 6 (six) hours as needed. Patient not taking: Reported on 01/07/2020 10/26/19   Drenda Freeze, MD  predniSONE (DELTASONE) 20 MG tablet Take 60 mg daily x 2 days then 40 mg daily x 2 days then 20 mg daily x 2 days Patient not taking: Reported on 01/15/2020 10/26/19   Drenda Freeze, MD  rosuvastatin (CRESTOR) 10 MG tablet Take 1 tablet (10 mg total) by mouth daily. Patient not taking: Reported on 06/08/2018 09/23/15   Cathlyn Parsons, PA-C       Critical care time: 55 minutes     CRITICAL CARE Performed by: Otilio Carpen Anisha Starliper   Total critical care time: 55 minutes  Critical care time was exclusive of separately billable procedures and treating other patients.  Critical care was necessary to treat or prevent imminent or life-threatening deterioration.  Critical care was time spent personally by me  on the following activities: development of treatment plan with patient and/or surrogate as well as nursing, discussions with consultants, evaluation of patient's response to treatment, examination of patient, obtaining history from patient or surrogate, ordering and performing treatments and interventions, ordering and review of laboratory studies, ordering and review of radiographic studies, pulse oximetry and re-evaluation of patient's condition.  Otilio Carpen Shamiracle Gorden, PA-C

## 2020-01-23 NOTE — Progress Notes (Addendum)
PROGRESS NOTE        PATIENT DETAILS Name: Andrea Keller Age: 74 y.o. Sex: female Date of Birth: 1946-06-16 Admit Date: 01/03/2020 Admitting Physician Elwyn Reach, MD [facility]:[id number], Fransico Him, MD  Brief Narrative: Patient is a 74 y.o. female with history of prior CVA-chronic left-sided hemiparesis-presenting with left facial droop-MRI brain with acute infarct.  Admitted to the hospitalist service for further evaluation and treatment.  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 7/24>> bilateral lower extremity Doppler: Negative for DVT  Antimicrobial therapy: None   Microbiology data: None  Procedures : None  Consults: Neurology  DVT Prophylaxis : enoxaparin (LOVENOX) injection 40 mg Start: 01/20/20 1500 SCD's Start: 01/17/2020 2320   Subjective: Developed more left sided weakness yesterday-MRI brain shows new MCA infarct.  She is able to lift her upper extremities today and really does not appear that different from her prior exam.  She does however appear to have more facial weakness.  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 fib.  Developed more left-sided weakness yesterday-MRI brain repeated on 7/23 shows new large left posterior MCA territory infarct-small bilateral ischemic infarcts as  well.  On exam this morning-apart from slight increase in left facial weakness-motor exam is really not that different from the past few days.  Neurology recommending further work-up-may even require TEE-we will await further recommendations.  Per prior neurology note-not a candidate for anticoagulation given history of ICH.  For now continue with aspirin/for 3 weeks followed by Plavix alone.  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  HLD: Continue statin  HTN: Discussed with neurology-starting low-dose amlodipine-we will slowly attempt to bring down BP over the next few days.  Thrombocytopenia: Appears to be mild-awaiting repeat CBC today.  Addendum: Platelet count down to 16K! No signs of bleeding. Have stopped ASA/Plavix and Prophylactic Lovenox. Spoke with pharmacy-they will send out HIT work up per protocol-has prior hx of thrombocytopenia with negative SRA in 2016. 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 Have ordered lab to save smear. Not sure at this point whether this is HIT, ITP-have consulted Hematology-Dr Dosey to evaluate and provide further recommendations.  Palliative Discussion: Prior hx of ICH-with residual left sided weakness-dependent on family for ADL's even prior to this hospital stay. Now has had more recurrent CVA's -likely embolic-but cyrptogenic. Suspect will have even more decline in her functional status than her usual baseline. Although work up is ongoing-do not think that it will change/improve her functional status. Discussed with Neuro-Dr Xu-we both agree-that her prognoses is not good at this point. Spoke with spouse over the phone this afternoon-explained above-he will talk to his daughter regarding [place], Code status etc, but is agreeable to have  a goals of care discussion with out palliative care team.   Diet: Diet  Order            Diet Heart Room service appropriate? Yes; Fluid consistency: Thin  Diet effective now                  Code Status: Full code   Family Communication: Spouse at bedside  Disposition Plan: Status is: Inpatient  Remains inpatient appropriate because:Inpatient level of care appropriate due to severity of illness  Dispo: The patient is from: Home              Anticipated d/c is to:  SNF              Anticipated d/c date is: >3 day              Patient currently is not medically stable to d/c.   Barriers to Discharge: Recurrent CVA-Thrombocytopenia-work up in progress  Antimicrobial agents: Anti-infectives (From admission, onward)   None       Time spent: 25- minutes-Greater than 50% of this time was spent in counseling, explanation of diagnosis, planning of further management, and coordination of care.  MEDICATIONS: Scheduled Meds: .  stroke: mapping our early stages of recovery book   Does not apply Once  . amLODipine  5 mg Oral Daily  . aspirin EC  81 mg Oral Daily  . clopidogrel  75 mg Oral Daily  . enoxaparin (LOVENOX) injection  40 mg Subcutaneous Q24H  . rosuvastatin  20 mg Oral Daily   Continuous Infusions: . sodium chloride 10 mL/hr at 01/21/20 0517   PRN Meds:.acetaminophen **OR** acetaminophen (TYLENOL) oral liquid 160 mg/5 mL **OR** acetaminophen, senna-docusate   PHYSICAL EXAM: Vital signs: Vitals:   01/23/20 0050 01/23/20 0400 01/23/20 0735 01/23/20 1154  BP: (!) 183/127 (!) 168/96 (!) 186/86 (!) 150/80  Pulse:   87 82  Resp:  16 15 15   Temp: 98.5 F (36.9 C) 98.4 F (36.9 C) 97.7 F (36.5 C) 97.7 F (36.5 C)  TempSrc: Axillary Axillary Axillary Axillary  SpO2:  98% 100%   Weight:      Height:       Filed Weights   01/01/2020 0757 01/19/20 1051  Weight: 76.7 kg 68.3 kg   Body mass index is 25.06 kg/m.   Gen Exam:Alert awake-not in any distress HEENT:atraumatic, normocephalic Chest: B/L clear to auscultation  anteriorly CVS:S1S2 regular Abdomen:soft non tender, non distended Extremities:no edema Neurology: Slight increase in left facial droop-continues to have left-sided weakness that is grossly unchanged from the past few days. Skin: no rash  I have personally reviewed following labs and imaging studies  LABORATORY DATA: CBC: Recent Labs  Lab 01/16/2020 1653  WBC 9.2  NEUTROABS 5.5  HGB 11.1*  HCT 37.6  MCV 79.7*  PLT 95*    Basic Metabolic Panel: Recent Labs  Lab 12/31/2019 1653  NA 142  K 4.3  CL 102  CO2 26  GLUCOSE 90  BUN 15  CREATININE 1.10*  CALCIUM 9.9    GFR: Estimated Creatinine Clearance: 39.8 mL/min (A) (by C-G formula based on SCr of 1.1 mg/dL (H)).  Liver Function Tests: Recent Labs  Lab 01/02/2020 1653  AST 23  ALT 16  ALKPHOS 86  BILITOT 0.9  PROT 7.9  ALBUMIN 4.1   No results for input(s): LIPASE, AMYLASE in the last 168 hours. No results for input(s): AMMONIA in the last 168 hours.  Coagulation Profile: Recent Labs  Lab  01/17/2020 1653  INR 1.2    Cardiac Enzymes: No results for input(s): CKTOTAL, CKMB, CKMBINDEX, TROPONINI in the last 168 hours.  BNP (last 3 results) No results for input(s): PROBNP in the last 8760 hours.  Lipid Profile: No results for input(s): CHOL, HDL, LDLCALC, TRIG, CHOLHDL, LDLDIRECT in the last 72 hours.  Thyroid Function Tests: No results for input(s): TSH, T4TOTAL, FREET4, T3FREE, THYROIDAB in the last 72 hours.  Anemia Panel: No results for input(s): VITAMINB12, FOLATE, FERRITIN, TIBC, IRON, RETICCTPCT in the last 72 hours.  Urine analysis:    Component Value Date/Time   COLORURINE STRAW (A) 01/24/2020 2105   APPEARANCEUR CLEAR 01/26/2020 2105   LABSPEC 1.005 01/27/2020 2105   PHURINE 7.0 01/13/2020 2105   GLUCOSEU NEGATIVE 01/01/2020 2105   HGBUR SMALL (A) 01/06/2020 2105   BILIRUBINUR NEGATIVE 01/17/2020 2105   KETONESUR NEGATIVE 01/21/2020 2105   PROTEINUR NEGATIVE 01/24/2020 2105    UROBILINOGEN 0.2 10/28/2014 0830   NITRITE NEGATIVE 01/24/2020 2105   LEUKOCYTESUR NEGATIVE 01/11/2020 2105    Sepsis Labs: Lactic Acid, Venous No results found for: LATICACIDVEN  MICROBIOLOGY: Recent Results (from the past 240 hour(s))  SARS Coronavirus 2 by RT PCR (hospital order, performed in [place] hospital lab) Nasopharyngeal Nasopharyngeal Swab     Status: None   Collection Time: 01/08/2020  9:02 PM   Specimen: Nasopharyngeal Swab  Result Value Ref Range Status   SARS Coronavirus 2 NEGATIVE NEGATIVE Final    Comment: (NOTE) SARS-CoV-2 target nucleic acids are NOT DETECTED.  The SARS-CoV-2 RNA is generally detectable in upper and lower respiratory specimens during the acute phase of infection. The lowest concentration of SARS-CoV-2 viral copies this assay can detect is 250 copies / mL. A negative result does not preclude SARS-CoV-2 infection and should not be used as the sole basis for treatment or other patient management decisions.  A negative result may occur with improper specimen collection / handling, submission of specimen other than nasopharyngeal swab, presence of viral mutation(s) within the areas targeted by this assay, and inadequate number of viral copies (<250 copies / mL). A negative result must be combined with clinical observations, patient history, and epidemiological information.  Fact Sheet for Patients:   StrictlyIdeas.no  Fact Sheet for Healthcare Providers: BankingDealers.co.za  This test is not yet approved or  cleared by the [place] FDA and has been authorized for detection and/or diagnosis of SARS-CoV-2 by FDA under an Emergency Use Authorization (EUA).  This EUA will remain in effect (meaning this test can be used) for the duration of the COVID-19 declaration under Section 564(b)(1) of the Act, 21 U.S.C. section 360bbb-3(b)(1), unless the authorization is terminated or revoked  sooner.  Performed at [facility], [place] [address], [place], [place] [address]   SARS Coronavirus 2 by RT PCR (hospital order, performed in [facility] hospital lab) Nasopharyngeal Nasopharyngeal Swab     Status: None   Collection Time: 01/22/20  4:00 PM   Specimen: Nasopharyngeal Swab  Result Value Ref Range Status   SARS Coronavirus 2 NEGATIVE NEGATIVE Final    Comment: (NOTE) SARS-CoV-2 target nucleic acids are NOT DETECTED.  The SARS-CoV-2 RNA is generally detectable in upper and lower respiratory specimens during the acute phase of infection. The lowest concentration of SARS-CoV-2 viral copies this assay can detect is 250 copies / mL. A negative result does not preclude SARS-CoV-2 infection and should not be used as the sole basis for treatment or other patient management decisions.  A  negative result may occur with improper specimen collection / handling, submission of specimen other than nasopharyngeal swab, presence of viral mutation(s) within the areas targeted by this assay, and inadequate number of viral copies (<250 copies / mL). A negative result must be combined with clinical observations, patient history, and epidemiological information.  Fact Sheet for Patients:   StrictlyIdeas.no  Fact Sheet for Healthcare Providers: BankingDealers.co.za  This test is not yet approved or  cleared by the [place] FDA and has been authorized for detection and/or diagnosis of SARS-CoV-2 by FDA under an Emergency Use Authorization (EUA).  This EUA will remain in effect (meaning this test can be used) for the duration of the COVID-19 declaration under Section 564(b)(1) of the Act, 21 U.S.C. section 360bbb-3(b)(1), unless the authorization is terminated or revoked sooner.  Performed at [facility], [place] [address] [place], [place] [address]     RADIOLOGY STUDIES/RESULTS: CT HEAD WO CONTRAST  Result  Date: 01/22/2020 CLINICAL DATA:  Status post fall with oral swelling. EXAM: CT HEAD WITHOUT CONTRAST TECHNIQUE: Contiguous axial images were obtained from the base of the skull through the vertex without intravenous contrast. COMPARISON:  January 18, 2020 and June 08, 2018 FINDINGS: Brain: There is moderate severity cerebral atrophy with widening of the extra-axial spaces and ventricular dilatation. There are areas of decreased attenuation within the white matter tracts of the supratentorial brain, consistent with microvascular disease changes. Small areas of cortical encephalomalacia, with adjacent chronic white matter low attenuation, are seen within the right frontal, right frontoparietal and bilateral occipital regions. Stable areas of mild cortical hyperdensity are seen within the right frontal lobe (axial CT images 25-28, CT series number 3). Vascular: No hyperdense vessel or unexpected calcification. Skull: Normal. Negative for fracture or focal lesion. Sinuses/Orbits: No acute finding. Other: None. IMPRESSION: 1. Stable exam without evidence of acute intracranial abnormality. 2. Generalized cerebral atrophy. 3. Chronic right frontal, right frontoparietal and bilateral occipital lobe infarcts. Electronically Signed   By: Virgina Norfolk M.D.   On: 01/22/2020 20:39   MR BRAIN WO CONTRAST  Result Date: 01/22/2020 CLINICAL DATA:  Stroke follow-up EXAM: MRI HEAD WITHOUT CONTRAST TECHNIQUE: Multiplanar, multiecho pulse sequences of the brain and surrounding structures were obtained without intravenous contrast. COMPARISON:  Brain MRI 01/17/2020 FINDINGS: Brain: There is a new, large area of abnormal diffusion restriction in the posterior left MCA territory. There are multiple small areas of diffusion restriction that have increased in number within both hemispheres. Small focus of diffusion restriction in the left cerebellum is also noted. Diffuse confluent hyperintense T2-weighted signal within the  periventricular, deep and juxtacortical white matter, most commonly due to chronic ischemic microangiopathy. There is generalized atrophy without lobar predilection. Unchanged hemosiderin deposition predominantly over the right hemisphere. Normal midline structures. Vascular: Normal flow voids. Skull and upper cervical spine: Normal marrow signal. Sinuses/Orbits: Negative. Other: None. IMPRESSION: 1. New, large area of acute ischemia in the posterior left MCA territory. No hemorrhage or mass effect. 2. Increased number of multiple small foci of acute ischemia within both hemispheres and in the left cerebellum. Pattern is most consistent with embolic disease. 3. Unchanged generalized atrophy and severe chronic ischemic microangiopathy. Electronically Signed   By: Ulyses Jarred M.D.   On: 01/22/2020 23:31   VAS [place] LOWER EXTREMITY VENOUS (DVT)  Result Date: 01/23/2020  Lower Venous DVTStudy Indications: Stroke.  Risk Factors: None identified. Limitations: Poor ultrasound/tissue interface and patient positioning, patient immobility. Comparison Study: No prior studies. Performing Technologist: Oliver Hum  RVT  Examination Guidelines: A complete evaluation includes B-mode imaging, spectral Doppler, color Doppler, and power Doppler as needed of all accessible portions of each vessel. Bilateral testing is considered an integral part of a complete examination. Limited examinations for reoccurring indications may be performed as noted. The reflux portion of the exam is performed with the patient in reverse Trendelenburg.  +---------+---------------+---------+-----------+----------+--------------+ RIGHT    CompressibilityPhasicitySpontaneityPropertiesThrombus Aging +---------+---------------+---------+-----------+----------+--------------+ CFV      Full           Yes      Yes                                 +---------+---------------+---------+-----------+----------+--------------+ SFJ      Full                                                         +---------+---------------+---------+-----------+----------+--------------+ FV Prox  Full                                                        +---------+---------------+---------+-----------+----------+--------------+ FV Mid   Full                                                        +---------+---------------+---------+-----------+----------+--------------+ FV DistalFull                                                        +---------+---------------+---------+-----------+----------+--------------+ PFV      Full                                                        +---------+---------------+---------+-----------+----------+--------------+ POP      Full           Yes      Yes                                 +---------+---------------+---------+-----------+----------+--------------+ PTV      Full                                                        +---------+---------------+---------+-----------+----------+--------------+ PERO     Full                                                        +---------+---------------+---------+-----------+----------+--------------+   +---------+---------------+---------+-----------+----------+--------------+  LEFT     CompressibilityPhasicitySpontaneityPropertiesThrombus Aging +---------+---------------+---------+-----------+----------+--------------+ CFV      Full           Yes      Yes                                 +---------+---------------+---------+-----------+----------+--------------+ SFJ      Full                                                        +---------+---------------+---------+-----------+----------+--------------+ FV Prox  Full                                                        +---------+---------------+---------+-----------+----------+--------------+ FV Mid   Full                                                         +---------+---------------+---------+-----------+----------+--------------+ FV DistalFull                                                        +---------+---------------+---------+-----------+----------+--------------+ PFV      Full                                                        +---------+---------------+---------+-----------+----------+--------------+ POP      Full           Yes      Yes                                 +---------+---------------+---------+-----------+----------+--------------+ PTV      Full                                                        +---------+---------------+---------+-----------+----------+--------------+ PERO     Full                                                        +---------+---------------+---------+-----------+----------+--------------+     Summary: RIGHT: - There is no evidence of deep vein thrombosis in the lower extremity. However, portions of this examination were limited- see technologist comments above.  - No cystic structure found in the popliteal fossa.  LEFT: - There is no evidence  of deep vein thrombosis in the lower extremity. However, portions of this examination were limited- see technologist comments above.  - No cystic structure found in the popliteal fossa.  *See table(s) above for measurements and observations. Electronically signed by Monica Martinez MD on 01/23/2020 at 11:59:41 AM.    Final      LOS: 5 days   Oren Binet, MD  [facility]    To contact the attending provider between 7A-7P or the covering provider during after hours 7P-7A, please log into the web site [email or web address] and access using universal [place] password for that web site. If you do not have the password, please call the hospital operator.  01/23/2020, 1:17 PM

## 2020-01-23 NOTE — Progress Notes (Signed)
Writer was notified of critical Platelets value 16. MD paged and informed of critical value. Will continue to monitor.

## 2020-01-23 NOTE — Progress Notes (Signed)
   01/23/20 2000  Clinical Encounter Type  Visited With Health care provider  Visit Type Code  Referral From Nurse  Consult/Referral To Chaplain   Chaplain responded to code blue. No family present. Chaplains remain available for support as needs arise.   Chaplain Resident, Evelene Croon, M Div [phone] on-call pager

## 2020-01-23 NOTE — Progress Notes (Signed)
Called EEG department to inform of pt's return from [place]. No answer. Left voicemail.

## 2020-01-23 NOTE — Progress Notes (Signed)
   01/22/20 1907  NIH Stroke Scale ( + Modified Stroke Scale Criteria)   Interval Neuro change  Level of Consciousness (1a.)    0  LOC Questions (1b. )   + 2  LOC Commands (1c. )   +  2  Best Gaze (2. )  + 1  Visual (3. )  + 1  Facial Palsy (4. )     2  Motor Arm, Left (5a. )   + 3  Motor Arm, Right (5b. )   + 0  Motor Leg, Left (6a. )   + 4  Motor Leg, Right (6b. )   + 4  Limb Ataxia (7. ) 2  Sensory (8. )   + 1  Best Language (9. )   + 1  Dysarthria (10. ) 1  Extinction/Inattention (11.)   + 0  Modified SS Total  + 19  Complete NIHSS TOTAL 24   NIH stroke scale performed at bedside during shift change.  Change in NIH scale noted with deterioration in neuro noted.  Dayshift RN in room and verifies alteration in mentation from previous assessment.    Rapid response RN notified at [place] and aware of situation. Neurology notified of change in NIH score, new orders received.  [facility] notified of situation, and is aware that neurology has been notified and additional orders have been placed for patient.  Alteration in mentation noted, patient had difficulty speaking words.  Patient was only able to move right arm, and dayshift CNA had reported that patient had poor bowel control and had been oozing stool for at least 30 minutes prior to shift change.

## 2020-01-23 NOTE — Plan of Care (Signed)
  Problem: Clinical Measurements: Goal: Ability to maintain clinical measurements within normal limits will improve Outcome: Progressing Goal: Will remain free from infection Outcome: Progressing Goal: Diagnostic test results will improve Outcome: Not Progressing Note: No progress towards goals; patient required CT and MRI during shift

## 2020-01-23 NOTE — Progress Notes (Signed)
Pt has NO HEPARIN order.

## 2020-01-23 NOTE — Progress Notes (Signed)
Bilateral lower extremity venous duplex has been completed. Preliminary results can be found in CV Proc through chart review.   01/23/20 10:01 AM Carlos Levering RVT

## 2020-01-23 NOTE — Progress Notes (Signed)
Patient not available for EEG, gone to CT, will try back later today or in am.

## 2020-01-23 NOTE — Progress Notes (Signed)
Brief Interval Hematology Note  Reviewed patient's peripheral blood film and found numerous schistocytes per high-power field, far greater than 5 per high-power field.  Went to patient room to perform clinical evaluation and found the patient to be unresponsive.  Sternal rub did not arouse patient.  Blood pressure appeared soft that time with a MAP of 49 and pupils appeared dilated and fixed.  Requested full set of vitals from nurse tech who found the patient to be satting at approximately 70% and bouts of bradycardia.  Patient had bouts of rapid breathing followed by complete apnea (consistent with Cheyne Stokes breathing). Patient never lost pulse during my evaluation but given her inability to protect her airway and irregular breathing I called a rapid response.  Rapid response team arrived patient was evaluated and it was decided to proceed with intubation.  Nursing staff called the patient's husband and daughter who recommended complete code and full interventions.  Of note the patient has a palliative care and goals of care discussion planned for tomorrow at approximately 10 AM.  They note that in the interim they would like all necessary interventions in order to keep the patient alive.  At this time I have numerous concerns about her CBC abnormalities and among them are the fact that the patient had numerous schistocytes with a low platelet count.  It is possible that the patient is experiencing intracranial bleed given the antiplatelet agents of Plavix, aspirin, and her markedly low platelet count.  Additionally if the patient does have TTP that is possible she developed another stroke.  It would be reasonable to perform a head CT, however in the event the patient was found to have worsening bleed platelets would only "feed the fire" in TTP and therefore would not be recommended.  Additionally the patient was suspected of having TTP plasmapheresis would be the treatment of choice.  In order to help  confirm the diagnosis of TTP we would need to show that this is a nonimmune hemolytic anemia.  I recommended a stat DAT, LDH, haptoglobin, and reticulocyte count.  If the patient is found to be DAT negative the recommendation would be for proceeding with plasmapheresis.  I would recommend touching base with the patient's family before consideration of starting this.  #Thrombocytopenia/Concern for TTP --recommend STAT CT of the patient's head to assess for worsening ischemia vs brain bleed (given Cheyne Stokes breathing on my exam). High risk of bleed given thrombocytopenia and antiplatelet agents --findings on peripheral blood film concerning for TTP (marked increase in schistocytes). Recommend STAT LDH, haptoglobin, DAT, and reticulocytes to help confirm the diagnosis. If plasmapharesis is started, please order an ADAMTS13 panel prior to initiation.  --platelet transfusion would make TTP worse, please consider carefully before administering platelets. In the event the patient has a cerebral bleed this may present [place] with a situation where there is no viable solution moving forward --agree with goals of care discussion with the family.   Please do not hesitate to call or page me with any questions or concerns regarding this patient's care.   Ledell Peoples, MD Department of Hematology/Oncology [place] at [facility] Phone: [phone] Pager: [phone] Email: [email or web address]

## 2020-01-23 NOTE — Consult Note (Signed)
Brief Hematology Consult Note  Brief Clinical Summary: Mrs. Andrea Keller is a 74 year old female with medical history significant for prior CVA who presents for evaluation of left facial droops and was found to have an acute left parietal infarct. She initially presented on 01/12/2020 with 3 days of facial asymmetry and MRI brain was performed in the ED which showed multiple punctate foci of abnormal diffusion restriction. She was started on therapy with heparin, aspirin, and plavix. At presentation her Plt count was 95. On 01/23/2020, repeat CBC showed a Plt 16.   CBC Latest Ref Rng & Units 01/23/2020 01/09/2020 10/26/2019  WBC 4.0 - 10.5 K/uL 12.1(H) 9.2 10.3  Hemoglobin 12.0 - 15.0 g/dL 9.5(L) 11.1(L) 11.5(L)  Hematocrit 36 - 46 % 31.0(L) 37.6 38.2  Platelets 150 - 400 K/uL 16(LL) 95(L) 157    CMP Latest Ref Rng & Units 01/23/2020 01/24/2020 10/26/2019  Glucose 70 - 99 mg/dL 112(H) 90 97  BUN 8 - 23 mg/dL 28(H) 15 19  Creatinine 0.44 - 1.00 mg/dL 1.57(H) 1.10(H) 1.35(H)  Sodium 135 - 145 mmol/L 144 142 143  Potassium 3.5 - 5.1 mmol/L 3.6 4.3 4.2  Chloride 98 - 111 mmol/L 112(H) 102 107  CO2 22 - 32 mmol/L 21(L) 26 26  Calcium 8.9 - 10.3 mg/dL 9.5 9.9 9.3  Total Protein 6.5 - 8.1 g/dL - 7.9 7.3  Total Bilirubin 0.3 - 1.2 mg/dL - 0.9 0.7  Alkaline Phos 38 - 126 U/L - 86 88  AST 15 - 41 U/L - 23 16  ALT 0 - 44 U/L - 16 13    A/P:  [name] is a 74 year old female with medical history significant for for prior CVA who presents for evaluation of left facial droops and was found to have an acute left parietal infarct.  She presented with a platelet count of 95 when previously was known to have normal platelets last seen in April 2021.  From 7 19-7 24 the patient did not have any platelets ordered, but today CBC showed white blood cell count 12.1, hemoglobin 9.5, MCV 75.8, and a platelet count of 16.  The differential for this is broad and includes HIT, ITP, spurious reading, or far less  likely TTP.  At this time would recommend holding all anticoagulation and ordering a HIT panel.  Additional studies to be collected below.  Hematology service will follow up formally with the patient tomorrow morning.  4T score calculated at 3 (low probability of HIT)   #Thrombocytopenia --full Hematology consult to follow tomorrow   --recommend holding all heparin products and ordering a HIT panel. With platelets at <16, recommend holding on all anti-platelet and anticoagulation medications until Plt count >50. --will review peripheral blood smear --please order LDH, haptoglobin, and a citrated platelet count --recommend transfusion of Plt with target >10. 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 Possible effect of levetiractam (rare). Diagnosis of exclusion    Ledell Peoples, MD Department of Hematology/Oncology [place] at [facility] Phone: [phone] Pager: [phone] Email: [email or web address]

## 2020-01-23 NOTE — Progress Notes (Addendum)
ANTICOAGULATION CONSULT NOTE - Initial Consult  Pharmacy Consult for HIT work up  Allergies  Allergen Reactions  . Heparin Other (See Comments)    7/24 platelets to 19 on enoxaparin, pending HIT OD and SRA   . Latex Rash    Patient Measurements: Height: 5\' 5"  (165.1 cm) Weight: 68.3 kg (150 lb 9.2 oz) IBW/kg (Calculated) : 57  Vital Signs: Temp: 97.7 F (36.5 C) (07/24 1154) Temp Source: Axillary (07/24 1154) BP: 150/80 (07/24 1154) Pulse Rate: 82 (07/24 1154)  Labs: Recent Labs    01/23/20 1400  HGB 9.5*  HCT 31.0*  PLT 16*  CREATININE 1.57*    Estimated Creatinine Clearance: 27.9 mL/min (A) (by C-G formula based on SCr of 1.57 mg/dL (H)).   Medical History: Past Medical History:  Diagnosis Date  . Diverticulosis   . 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 Hyperplastic colon polyp   . Hypertension   . Insomnia   . Microcytosis   . Osteoarthritis   . PVD (peripheral vascular disease) ([place])   . Stroke [facility])     Assessment: 74 yo W admitted with CVA 7/19 with worsening CVA 7/24 and platelet drop from 95 to 16 since admission. 4T score = 5- 7 (2 Plt drop, 0-2 for timing (unclear if had heparin exposure in the last 100 days), 2 for new CVA, 1 other causes) Discussed HIT work up with MD, ordered HIT OD and SRA. MD to discuss if bivalirudin will be started after discussing with Neurology.   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   Goal of Therapy:  Monitor platelets by anticoagulation protocol: Yes   Plan:  F/u HIT OD and SRA Hold all heparin products F/u if to start bivalirudin    Benetta Spar, PharmD, BCPS, BCCP Clinical Pharmacist  Please check AMION for all [place] phone numbers After 10:00 PM, call [place] [phone]

## 2020-01-23 NOTE — Code Documentation (Signed)
  Patient Name: Andrea Keller   MRN: [id number]   Date of Birth/ Sex: [birth year] , female      Admission Date: 01/01/2020  Attending Provider: Jonetta Osgood, MD  Primary Diagnosis: Cerebrovascular accident (CVA) due to embolism of cerebral artery [facility])   Indication: Pt was in her usual state of health until this PM, when she was noted to be unresponsive and hypotensive. Code blue was subsequently called. However, patient never lost pulse. She was found to be in acute respiratory failure. Patient was intubated shortly thereafter by anesthesia. Her blood pressure responded well to fluid bolus. Care was transferred to [facility].     Miscellaneous Information:     - Primary team notified?  Yes  - Family Notified? Yes  - Additional notes/ transfer status: Transferred in stable condition to ICU     Delice Bison, DO  01/23/2020, 9:52 PM2

## 2020-01-23 NOTE — Consult Note (Addendum)
Palliative Medicine Inpatient Consult Note  Reason for consult:  Goals of Care "recurrent CVA-poor functional status-GOC"  HPI:  Per intake H&P --> Patient is a 74 y.o. female with history of prior CVA-chronic left-sided hemiparesis-presenting with left facial droop-MRI brain with acute infarct.   Palliative care was asked to get involved to aid on goals of care conversation given her poor prognosis.  Clinical Assessment/Goals of Care: I have reviewed medical records including EPIC notes, labs and imaging, received report from bedside RN, assessed the patient.    I met with Herbie Baltimore (son), Peter Congo (daughter), Jenny Reichmann (spouse), Stanton Kidney (sister), and Geni Bers this morning to further discuss diagnosis prognosis, GOC, EOL wishes, disposition and options.   I introduced Palliative Medicine as specialized medical care for people living with serious illness. It focuses on providing relief from the symptoms and stress of a serious illness. The goal is to improve quality of life for both the patient and the family.  I asked Jenny Reichmann to tell me about his wife. He shares that she is from [place], [place]. They have been married for the past 48 two years, he asked her to marry him before being deployed in the [place] war. They share a son and daughter together. 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 She loved dancing, music, and the lord. She attended church every Sunday. She is of the [employer] denomination.  Analynn is described as a strong and independent woman. She placed the focus of her family before herself, always.  She was a loving woman and the [place] Gess of her family.  In terms of Caylee's functional state, she has been declining for quite sometime per her family. She kept her health needs to herself though as of most recently she was completely dependent on them for all bADLs.   John shares that he spoke with Dr. Sloan Leiter yesterday and he was told that Orissa has very  little chance of meaningful recover or improvements. He expresses that this was hard to hear and making the decision for resuscitation overnight was agonizing but he and his family want the opportunity to say a final goodbye. He was extremely tearful at this time. I offered support through therapeutic listening.   Jenny Reichmann shares that he would like to focus on comfort for his wife. We reviewed that she can remain intubated for her family members to visit though I would not continue this for days as I worry that will delay the inevitable and cause a greater degree of distress. Her daughter Peter Congo shares that the plan will be to extubate tomorrow, likely in the morning.   A detailed discussion was had today regarding advanced directives.  Concepts specific to code status, artifical feeding and hydration, continued IV antibiotics and rehospitalization was had. Patient is now a DNR no heroic measures.   The difference between a aggressive medical intervention path  and a palliative comfort care path for this patient at this time was had. We talked about transition to comfort measures in house and what that would entail inclusive of medications to control pain, dyspnea, agitation, nausea, itching, and hiccups.  We discussed stopping all uneccessary measures such as blood draws, needle sticks, and frequent vital signs.   Discussed the importance of continued conversation with family and their  medical providers regarding overall plan of care and treatment options, ensuring decisions are within the context of the patients values and GOCs.  Palliative care will check in tomorrow morning to  identify what time we could consider extubating. I explained that the process thereafter could take minutes, hours, or days. The family expressed understanding. We reviewed that if needed patients are often transferred to 6N to continue comfort oriented care.   Decision Maker: Konnor Jorden (Spouse) [phone]   SUMMARY OF  RECOMMENDATIONS   DNAR  Comfort Focus  Patient remains intubated presently to enable family to see her. Plan for liberation from ventilatory support tomorrow. Daughter, Peter Congo will let us know when she is ready.   Initiated dilaudid gtt as below  Code Status/Advance Care Planning: DNAR    Symptom Management:  Dyspnea: Pain:  - Dilaudid gtt w/ boluses Fever:  - Tylenol 679m PO/PR Q6H PRN Agitation: Anxiety:  - Lorazepam 0.5-185mV  Q1H PRN Nausea:  - Zofran 90m66mV Q6H PRN  Secretions:  - Glycopyrrolate 0.90mg48m Q4H PRN Dry Eyes:  - Artificial Tears PRN Xerostomia:  - BID oral care Urinary Retention:  - Maintain foley  Constipation:  - Bisacodyl 10mg790mPRN QDay Spiritual:  - Chaplain consult   Palliative Prophylaxis:   Oral Care, Constipation, Pain, Dyspnea  Additional Recommendations (Limitations, Scope, Preferences):  Comfort oriented care   Psycho-social/Spiritual:   Desire for further Chaplaincy support: Yes - [employer]  Additional Recommendations: Education on end of life care   Prognosis: Poor.  Discharge Planning: Discharge will be celestial.  PPS: 10%   This conversation/these recommendations were discussed with patient primary care team, Dr. Mc[place]aLake Bellse In: 0900 Time Out: 1010 Total Time: 70 Gr18ter than 50%  of this time was spent counseling and coordinating care related to the above assessment and plan.  [facility] Team Cell Phone: [phone] utilize secure chat with additional questions, if there is no response within 30 minutes please call the above phone number  Palliative Medicine Team providers are available by phone from 7am to 7pm daily and can be reached through the team cell phone.  Should this patient require assistance outside of these hours, please call the patient's attending physician.

## 2020-01-23 NOTE — Progress Notes (Signed)
STROKE TEAM PROGRESS NOTE   INTERVAL HISTORY RNs are at bedside. RN reported that pt at 3pm was able to orientate to self and answer questions with one word answer, but checked on her at 4pm pt was altered, not answer any questions, intermittently staring off, and tongue rhythmic movement in and out of the tongue. Not moving left arm or leg.   As per note, pt had acute change yesterday 7:30pm with AMS, difficulty speaking, worsening left sided weakness. Repeat CT head no acute change. MRI showed left brain acute infarct. Symptoms improved on rechecking, able to answer questions with 1-3 word answers and was able to move all extremities.     Vitals:   01/23/20 0050 01/23/20 0400 01/23/20 0735 01/23/20 1154  BP: (!) 183/127 (!) 168/96 (!) 186/86 (!) 150/80  Pulse:   87 82  Resp:  16 15 15   Temp: 98.5 F (36.9 C) 98.4 F (36.9 C) 97.7 F (36.5 C) 97.7 F (36.5 C)  TempSrc: Axillary Axillary Axillary Axillary  SpO2:  98% 100%   Weight:      Height:       CBC:  Recent Labs  Lab 01/17/2020 1653  WBC 9.2  NEUTROABS 5.5  HGB 11.1*  HCT 37.6  MCV 79.7*  PLT 95*   Basic Metabolic Panel:  Recent Labs  Lab 01/09/2020 1653  NA 142  K 4.3  CL 102  CO2 26  GLUCOSE 90  BUN 15  CREATININE 1.10*  CALCIUM 9.9   Lipid Panel:  Recent Labs  Lab 01/19/20 0445  CHOL 114  TRIG 99  HDL 38*  CHOLHDL 3.0  VLDL 20  LDLCALC 56   HgbA1c:  Recent Labs  Lab 01/19/20 0445  HGBA1C 6.0*   Urine Drug Screen:  Recent Labs  Lab 01/10/2020 2105  LABOPIA NONE DETECTED  COCAINSCRNUR NONE DETECTED  LABBENZ NONE DETECTED  AMPHETMU NONE DETECTED  THCU NONE DETECTED  LABBARB NONE DETECTED    Alcohol Level  Recent Labs  Lab 01/29/2020 1653  ETH <10    IMAGING past 24 hours CT HEAD WO CONTRAST  Result Date: 01/22/2020 CLINICAL DATA:  Status post fall with oral swelling. EXAM: CT HEAD WITHOUT CONTRAST TECHNIQUE: Contiguous axial images were obtained from the base of the skull through the  vertex without intravenous contrast. COMPARISON:  January 18, 2020 and June 08, 2018 FINDINGS: Brain: There is moderate severity cerebral atrophy with widening of the extra-axial spaces and ventricular dilatation. There are areas of decreased attenuation within the white matter tracts of the supratentorial brain, consistent with microvascular disease changes. Small areas of cortical encephalomalacia, with adjacent chronic white matter low attenuation, are seen within the right frontal, right frontoparietal and bilateral occipital regions. Stable areas of mild cortical hyperdensity are seen within the right frontal lobe (axial CT images 25-28, CT series number 3). Vascular: No hyperdense vessel or unexpected calcification. Skull: Normal. Negative for fracture or focal lesion. Sinuses/Orbits: No acute finding. Other: None. IMPRESSION: 1. Stable exam without evidence of acute intracranial abnormality. 2. Generalized cerebral atrophy. 3. Chronic right frontal, right frontoparietal and bilateral occipital lobe infarcts. Electronically Signed   By: Virgina Norfolk M.D.   On: 01/22/2020 20:39   MR BRAIN WO CONTRAST  Result Date: 01/22/2020 CLINICAL DATA:  Stroke follow-up EXAM: MRI HEAD WITHOUT CONTRAST TECHNIQUE: Multiplanar, multiecho pulse sequences of the brain and surrounding structures were obtained without intravenous contrast. COMPARISON:  Brain MRI 01/23/2020 FINDINGS: Brain: There is a new, large area of abnormal diffusion  restriction in the posterior left MCA territory. There are multiple small areas of diffusion restriction that have increased in number within both hemispheres. Small focus of diffusion restriction in the left cerebellum is also noted. Diffuse confluent hyperintense T2-weighted signal within the periventricular, deep and juxtacortical white matter, most commonly due to chronic ischemic microangiopathy. There is generalized atrophy without lobar predilection. Unchanged hemosiderin  deposition predominantly over the right hemisphere. Normal midline structures. Vascular: Normal flow voids. Skull and upper cervical spine: Normal marrow signal. Sinuses/Orbits: Negative. Other: None. IMPRESSION: 1. New, large area of acute ischemia in the posterior left MCA territory. No hemorrhage or mass effect. 2. Increased number of multiple small foci of acute ischemia within both hemispheres and in the left cerebellum. Pattern is most consistent with embolic disease. 3. Unchanged generalized atrophy and severe chronic ischemic microangiopathy. Electronically Signed   By: Ulyses Jarred M.D.   On: 01/22/2020 23:31   VAS [place] LOWER EXTREMITY VENOUS (DVT)  Result Date: 01/23/2020  Lower Venous DVTStudy Indications: Stroke.  Risk Factors: None identified. Limitations: Poor ultrasound/tissue interface and patient positioning, patient immobility. Comparison Study: No prior studies. Performing Technologist: Oliver Hum RVT  Examination Guidelines: A complete evaluation includes B-mode imaging, spectral Doppler, color Doppler, and power Doppler as needed of all accessible portions of each vessel. Bilateral testing is considered an integral part of a complete examination. Limited examinations for reoccurring indications may be performed as noted. The reflux portion of the exam is performed with the patient in reverse Trendelenburg.  +---------+---------------+---------+-----------+----------+--------------+ RIGHT    CompressibilityPhasicitySpontaneityPropertiesThrombus Aging +---------+---------------+---------+-----------+----------+--------------+ CFV      Full           Yes      Yes                                 +---------+---------------+---------+-----------+----------+--------------+ SFJ      Full                                                        +---------+---------------+---------+-----------+----------+--------------+ FV Prox  Full                                                         +---------+---------------+---------+-----------+----------+--------------+ FV Mid   Full                                                        +---------+---------------+---------+-----------+----------+--------------+ FV DistalFull                                                        +---------+---------------+---------+-----------+----------+--------------+ PFV      Full                                                        +---------+---------------+---------+-----------+----------+--------------+  POP      Full           Yes      Yes                                 +---------+---------------+---------+-----------+----------+--------------+ PTV      Full                                                        +---------+---------------+---------+-----------+----------+--------------+ PERO     Full                                                        +---------+---------------+---------+-----------+----------+--------------+   +---------+---------------+---------+-----------+----------+--------------+ LEFT     CompressibilityPhasicitySpontaneityPropertiesThrombus Aging +---------+---------------+---------+-----------+----------+--------------+ CFV      Full           Yes      Yes                                 +---------+---------------+---------+-----------+----------+--------------+ SFJ      Full                                                        +---------+---------------+---------+-----------+----------+--------------+ FV Prox  Full                                                        +---------+---------------+---------+-----------+----------+--------------+ FV Mid   Full                                                        +---------+---------------+---------+-----------+----------+--------------+ FV DistalFull                                                         +---------+---------------+---------+-----------+----------+--------------+ PFV      Full                                                        +---------+---------------+---------+-----------+----------+--------------+ POP      Full           Yes      Yes                                 +---------+---------------+---------+-----------+----------+--------------+  PTV      Full                                                        +---------+---------------+---------+-----------+----------+--------------+ PERO     Full                                                        +---------+---------------+---------+-----------+----------+--------------+     Summary: RIGHT: - There is no evidence of deep vein thrombosis in the lower extremity. However, portions of this examination were limited- see technologist comments above.  - No cystic structure found in the popliteal fossa.  LEFT: - There is no evidence of deep vein thrombosis in the lower extremity. However, portions of this examination were limited- see technologist comments above.  - No cystic structure found in the popliteal fossa.  *See table(s) above for measurements and observations. Electronically signed by Monica Martinez MD on 01/23/2020 at 11:59:41 AM.    Final     PHYSICAL EXAM  Temp:  [97.7 F (36.5 C)-98.5 F (36.9 C)] 97.7 F (36.5 C) (07/24 1154) Pulse Rate:  [76-97] 82 (07/24 1154) Resp:  [15-21] 15 (07/24 1154) BP: (146-193)/(60-127) 150/80 (07/24 1154) SpO2:  [98 %-100 %] 100 % (07/24 0735)  General - Well nourished, well developed, lethargic.  Ophthalmologic - fundi not visualized due to noncooperation.  Cardiovascular - Regular rhythm and rate.  Neuro - lethargic, eyes staring off, only making sounds with voice and tactile stimulation, non verbal, not following commands. Eyes conjugated, but not blinking to visual threat bilaterally. Intermittent tongue involuntary movement in and out of tongue,  midline. No facial twitching or eyelid twitching. Left facial droop. Left UE spastic, no movement on request. LLE not against gravity. RUE at least 4/5 and RLE 3/5. Sensation, coordination and gait not tested.  ASSESSMENT/PLAN Andrea Keller is a 74 y.o. female with history of multiple previous strokes including what was likely a hemorrhagic infarct in 2017, HTN, PVD, OA  presenting with increased facial weakness.   Stroke:   Multiple R punctate infarcts and new L MCA infarcts, embolic pattern, now concerning for TTP  CT head New hypoattenutation L frontal lobe. Stable old infarcts R frontal parietal, occipital. Old R frontal lobe hemorrhage. L supraclinoid aneurysm.   MRI  Multiple punctate R brain infarcts in watershed distribution. Small vessel disease. Atrophy.   CTA head & neck no LVO. L ICA fusiform aneurysm stable. Moderate narrowing L V4. Aortic atherosclerosis.   Repeat MRI 7/23 due to neuro change - new left MCA infarcts. Increased number of multiple small foci of acute ischemia within both hemispheres and in the left cerebellum  2D Echo EF 60-65%. No source of embolus   2017 30 day cardiac monitor neg for AF  LDL 56  HgbA1c 6.0  VTE prophylaxis - SCDs   aspirin 81 mg daily prior to admission, now on aspirin 81 mg daily and plavix 75. However, both now discontinued due to thrombocytopenia.    Therapy recommendations:  CIR  Disposition:  pending   Thrombocytopenia - ? HIT ? 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 [name] - cerebral angiogram on 10/18/2015 which showed 11x9 fusiform left ICA near PCOM aneurysm as well as 2 mm aneurysm of right ICA cavernous segment  Follow up recommended w/ Deveshwar but not documentation this ever occurred  This admission CTA head and neck L ICA fusiform aneurysm stable 40mm.  Recommend no AC treatment until aneurysm secured.   History of stroke  08/2015 right frontal ICH (? Hemorrhagic conversion?) and b/l embolic infarcts - MRA left paraclinoid aneurysm 7x33mm. MRA neck neg. EEG and TTE neg. LDL 84 and A1C 6.0. 30 day cardiac monitor neg.   10/2015 and 12/2015 repeat MRI and MRA no malignancy or AVM  Hypertension  Stable on the high side . Permissive hypertension (OK if < 220/120) but gradually normalize in 5-7 days . Long-term BP goal normotensive  Hyperlipidemia  Home meds:  Crestor 20, resumed in hospital  LDL 56, goal < 70  Continue statin at discharge  Other Stroke Risk Factors  Advanced age  Former Cigarette smoker  PVD  Other Active Problems  Hx colon cancer s/p Sx 12/2014      Patient condition worsened within the last 24 hours, has developed worsening left sided weakness, AMS, seizure like activity, MRI now stroke L MCA and bilaterally, worsening thrombocytopnea continues to have worsening CKD, and I have ordered repeat CT, stat EEG, ativan and keppra. I discussed with Dr. Sloan Leiter. I spent  35 minutes in total face-to-face time with the patient, more than 50% of which was spent in counseling and coordination of care, reviewing test results, images and medication, and discussing the diagnosis, treatment plan and potential prognosis. This patient's care requiresreview of multiple databases, neurological assessment, discussion with family, other specialists and medical decision making of high complexity.   Rosalin Hawking, MD PhD Stroke Neurology 01/23/2020 11:37 PM  To contact Stroke  Continuity provider, please refer to [email or web address] After hours, contact General Neurology

## 2020-01-23 NOTE — Progress Notes (Signed)
Called to update spouse  He will be here at 9 AM

## 2020-01-23 NOTE — Progress Notes (Addendum)
Pt's spouse was called and informed of family meeting on Sunday 01/24/20 at [place].

## 2020-01-23 NOTE — Progress Notes (Signed)
   01/22/20 2100  NIH Stroke Scale ( + Modified Stroke Scale Criteria)   Interval Neuro change  Level of Consciousness (1a.)    0  LOC Questions (1b. )   + 1  LOC Commands (1c. )   +  1  Best Gaze (2. )  + 1  Visual (3. )  + 1  Facial Palsy (4. )     2  Motor Arm, Left (5a. )   + 2  Motor Arm, Right (5b. )   + 0  Motor Leg, Left (6a. )   + 2  Motor Leg, Right (6b. )   + 2  Limb Ataxia (7. ) 2  Sensory (8. )   + 1  Best Language (9. )   + 1  Dysarthria (10. ) 1  Extinction/Inattention (11.)   + 0  Modified SS Total  + 12  Complete NIHSS TOTAL 17   NIH scale reassessed, improvement in score noted.  Patient now more awake, able to answer questions with 1-3 word answers and was able to move all extremities.  Dr.Kirkpatrick (neurology) was at bedside to assess patient.

## 2020-01-23 NOTE — Progress Notes (Signed)
Pharmacy Code Blue/Intubation Controlled Substance Documentation  Pulled fentanyl 125mcg/2mL and midazolam 2mg /57ml from pyxis during Code/intuation per CRNA. Only gave fentanyl 7mcg and midazolam 1mg . Remainder was wasted in [employer] with another pharmacist as witness.   Benetta Spar, PharmD, BCPS, BCCP Clinical Pharmacist  Please check AMION for all [place] phone numbers After 10:00 PM, call [place] [phone]

## 2020-01-23 NOTE — TOC Progression Note (Signed)
Transition of Care [facility]) - Progression Note    Patient Details  Name: Andrea Keller MRN: [id number] Date of Birth: [birth year]  Transition of [facility]) CM/SW Contact  Elliot Gurney [place], [place] Phone Number: [phone] 01/23/2020, 10:48 AM  Clinical Narrative:    Phone call to [facility]. Patient approved for [facility] and [place] Coordinator is Bryson Dames NAVI Authorization ID [id number] plan authorization  ID [id number]  Approval date 01/22/20 start date 01/23/20-next review date 01/26/20. Fax number for continued stay [phone].   Expected Discharge Plan: IP Rehab Facility Barriers to Discharge: Insurance Authorization  Expected Discharge Plan and Services Expected Discharge Plan: [place]   Discharge Planning Services: CM Consult Post Acute Care Choice: IP Rehab Living arrangements for the past 2 months: Single Family Home                                       Social Determinants of Health (SDOH) Interventions    Readmission Risk Interventions No flowsheet data found.

## 2020-01-24 ENCOUNTER — Other Ambulatory Visit (HOSPITAL_COMMUNITY): Payer: Medicare Other

## 2020-01-24 ENCOUNTER — Encounter (HOSPITAL_COMMUNITY): Payer: Self-pay

## 2020-01-24 LAB — CBC
HCT: 31 % — ABNORMAL LOW (ref 36.0–46.0)
HCT: 31 % — ABNORMAL LOW (ref 36.0–46.0)
Hemoglobin: 9.5 g/dL — ABNORMAL LOW (ref 12.0–15.0)
Hemoglobin: 9.5 g/dL — ABNORMAL LOW (ref 12.0–15.0)
MCH: 23.2 pg — ABNORMAL LOW (ref 26.0–34.0)
MCH: 24 pg — ABNORMAL LOW (ref 26.0–34.0)
MCHC: 30.6 g/dL (ref 30.0–36.0)
MCHC: 30.6 g/dL (ref 30.0–36.0)
MCV: 75.8 fL — ABNORMAL LOW (ref 80.0–100.0)
MCV: 78.3 fL — ABNORMAL LOW (ref 80.0–100.0)
Platelets: 16 10*3/uL — CL (ref 150–400)
Platelets: 14 10*3/uL — CL (ref 150–400)
RBC: 4.09 MIL/uL (ref 3.87–5.11)
RBC: 3.96 MIL/uL (ref 3.87–5.11)
RDW: 17 % — ABNORMAL HIGH (ref 11.5–15.5)
RDW: 17.6 % — ABNORMAL HIGH (ref 11.5–15.5)
WBC: 12.1 10*3/uL — ABNORMAL HIGH (ref 4.0–10.5)
WBC: 17.6 10*3/uL — ABNORMAL HIGH (ref 4.0–10.5)
nRBC: 0.3 % — ABNORMAL HIGH (ref 0.0–0.2)
nRBC: 1.6 % — ABNORMAL HIGH (ref 0.0–0.2)

## 2020-01-24 LAB — COMPREHENSIVE METABOLIC PANEL
ALT: 24 U/L (ref 0–44)
AST: 74 U/L — ABNORMAL HIGH (ref 15–41)
Albumin: 3 g/dL — ABNORMAL LOW (ref 3.5–5.0)
Alkaline Phosphatase: 67 U/L (ref 38–126)
Anion gap: 16 — ABNORMAL HIGH (ref 5–15)
BUN: 37 mg/dL — ABNORMAL HIGH (ref 8–23)
CO2: 15 mmol/L — ABNORMAL LOW (ref 22–32)
Calcium: 8.9 mg/dL (ref 8.9–10.3)
Chloride: 113 mmol/L — ABNORMAL HIGH (ref 98–111)
Creatinine, Ser: 2.05 mg/dL — ABNORMAL HIGH (ref 0.44–1.00)
GFR calc Af Amer: 27 mL/min — ABNORMAL LOW (ref >60)
GFR calc non Af Amer: 23 mL/min — ABNORMAL LOW (ref >60)
Glucose, Bld: 187 mg/dL — ABNORMAL HIGH (ref 70–99)
Potassium: 5.5 mmol/L — ABNORMAL HIGH (ref 3.5–5.1)
Sodium: 144 mmol/L (ref 135–145)
Total Bilirubin: 2.2 mg/dL — ABNORMAL HIGH (ref 0.3–1.2)
Total Protein: 6 g/dL — ABNORMAL LOW (ref 6.5–8.1)

## 2020-01-24 LAB — PREPARE PLATELET PHERESIS
Unit division: 0
Unit division: 0
Unit division: 0

## 2020-01-24 LAB — BPAM PLATELET PHERESIS
Blood Product Expiration Date: 202107252359
Blood Product Expiration Date: 202107262359
Blood Product Expiration Date: 202107272359
ISSUE DATE / TIME: 202107250730
ISSUE DATE / TIME: 202107250734
Unit Type and Rh: 6200
Unit Type and Rh: 7300
Unit Type and Rh: 7300

## 2020-01-24 LAB — GLUCOSE, CAPILLARY: Glucose-Capillary: <10 mg/dL — CL (ref 70–99)

## 2020-01-24 LAB — ABO/RH: ABO/RH(D): AB POS

## 2020-01-24 LAB — TRIGLYCERIDES: Triglycerides: 160 mg/dL — ABNORMAL HIGH (ref <150)

## 2020-01-24 LAB — TYPE AND SCREEN
ABO/RH(D): AB POS
Antibody Screen: NEGATIVE

## 2020-01-24 LAB — IMMATURE PLATELET FRACTION: Immature Platelet Fraction: 7.8 % (ref 1.2–8.6)

## 2020-01-24 MED ORDER — CHLORHEXIDINE GLUCONATE 0.12% ORAL RINSE (MEDLINE KIT)
15.0000 mL | Freq: Two times a day (BID) | OROMUCOSAL | Status: DC
  Administered 2020-01-24 (×2): 15 mL via OROMUCOSAL

## 2020-01-24 MED ORDER — ONDANSETRON HCL 4 MG/2ML IJ SOLN: 4.0000 mg | Freq: Four times a day (QID) | INTRAMUSCULAR | Status: DC | PRN

## 2020-01-24 MED ORDER — DEXTROSE 50 % IV SOLN
1.0000 | Freq: Once | INTRAVENOUS | Status: AC
  Administered 2020-01-24: 50 mL via INTRAVENOUS

## 2020-01-24 MED ORDER — BISACODYL 10 MG RE SUPP: 10.0000 mg | Freq: Every day | RECTAL | Status: DC | PRN

## 2020-01-24 MED ORDER — SODIUM CHLORIDE 0.9% IV SOLUTION: Freq: Once | INTRAVENOUS | Status: DC

## 2020-01-24 MED ORDER — POLYVINYL ALCOHOL 1.4 % OP SOLN
2.0000 [drp] | OPHTHALMIC | Status: DC | PRN
  Filled 2020-01-24: qty 15

## 2020-01-24 MED ORDER — DEXTROSE 10 % IV SOLN: INTRAVENOUS | Status: DC

## 2020-01-24 MED ORDER — ORAL CARE MOUTH RINSE: 15.0000 mL | OROMUCOSAL | Status: DC

## 2020-01-24 MED ORDER — HYPROMELLOSE (GONIOSCOPIC) 2.5 % OP SOLN
2.0000 [drp] | OPHTHALMIC | Status: DC | PRN
  Filled 2020-01-24: qty 15

## 2020-01-24 MED ORDER — LORAZEPAM 2 MG/ML IJ SOLN
0.5000 mg | INTRAMUSCULAR | Status: DC | PRN
  Administered 2020-01-25: 1 mg via INTRAVENOUS
  Filled 2020-01-24: qty 1

## 2020-01-24 MED ORDER — SODIUM CHLORIDE 0.9 % IV SOLN
0.5000 mg/h | INTRAVENOUS | Status: DC
  Administered 2020-01-24: 0.5 mg/h via INTRAVENOUS
  Administered 2020-01-25: 1 mg/h via INTRAVENOUS
  Filled 2020-01-24 (×2): qty 2.5

## 2020-01-24 MED ORDER — HYDROMORPHONE BOLUS VIA INFUSION
0.5000 mg | INTRAVENOUS | Status: DC | PRN
  Filled 2020-01-24: qty 1

## 2020-01-24 MED ORDER — LEVETIRACETAM IN NACL 500 MG/100ML IV SOLN
500.0000 mg | Freq: Two times a day (BID) | INTRAVENOUS | Status: DC
  Administered 2020-01-24 – 2020-01-25 (×2): 500 mg via INTRAVENOUS
  Filled 2020-01-24 (×2): qty 100

## 2020-01-24 MED ORDER — GLYCOPYRROLATE 0.2 MG/ML IJ SOLN
0.4000 mg | INTRAMUSCULAR | Status: DC | PRN
  Administered 2020-01-25: 0.4 mg via INTRAVENOUS
  Filled 2020-01-24: qty 2

## 2020-01-24 NOTE — Progress Notes (Signed)
[name], MRN:  [id number], DOB:  09-12-1945, LOS: 6 ADMISSION DATE:  01/22/2020, CONSULTATION DATE:  7/24 REFERRING MD:  Sloan Leiter, CHIEF COMPLAINT:  Code blue/respiratory arrest  Brief History   74 y.o. F admitted 7/19 with L facial droop and found to have subacute cortical areas of punctate ischemia.  Pt received 2mg  Ativan for repeat head CT and then was found unresponsive and hypoxic.  No loss of pulses, she was intubated and transferred to the intensive care unit.  Past Medical History  Diverticulosis Goblet cell carcinoid Hyperplastic colon polyp Hypertension Microcyt ptosis Osteoarthritis Peripheral vascular disease History of stroke  Significant Hospital Events   7/19 Admit to hospitalists 7/24 Respiratory and mental status decline prompting intubation and ICU txfr  Consults:  Neurology Hematology Palliative  Procedures:  7/24 ETT  Significant Diagnostic Tests:  7/19 CT head>>New areas of cortically based hypoattenuation involving the anterosuperior left frontal lobe 7/19 MRI brain>>Multiple punctate foci abnormal diffusion restriction, predominantly within the right hemisphere in a watershed distribution 7/19 CTA head/neck>>No emergent large vessel occlusion or high-grade stenosis of the intracranial arteries. 7/23 MRI brain>>New, large area of acute ischemia in the posterior left MCA territory. No hemorrhage or mass effect. 7/24 CT head>>Stable appearance of left parietal infarct without hemorrhagic conversion. 7/24 CXR>>Low lung volumes with bibasilar atelectasis.  Micro Data:  7/19, 7/23 Sars-CoV-2>>negative  Antimicrobials:    Interim history/subjective:  Admitted overnight to ICU from floor after a respiratory arrest  Objective   Blood pressure (!) 151/87, pulse 77, temperature 98 F (36.7 C), temperature source Oral, resp. rate 16, height 5\' 2"  (1.575 m), weight 66.2 kg, SpO2 100 %.    Vent Mode: PRVC FiO2 (%):  [40 %-100 %] 40 % Set  Rate:  [16 bmp] 16 bmp Vt Set:  [400 mL] 400 mL PEEP:  [5 cmH20] 5 cmH20 Plateau Pressure:  [14 cmH20-15 cmH20] 14 cmH20   Intake/Output Summary (Last 24 hours) at 01/24/2020 0749 Last data filed at 01/24/2020 0600 Gross per 24 hour  Intake 587.65 ml  Output 35 ml  Net 552.65 ml   Filed Weights   01/06/2020 0757 01/19/20 1051 01/23/20 2200  Weight: 76.7 kg 68.3 kg 66.2 kg    Examination:  General:  In bed on vent HENT: NCAT ETT in place PULM: CTA B, vent supported breathing CV: RRR, no mgr GI: BS+, soft, nontender MSK: normal bulk and tone Neuro: sedated on vent   Resolved Hospital Problem list    Assessment & Plan:  Acute hypoxemic respiratory failure due to inability to protect airway in setting of stroke, generalized weakness Full mechanical vent support VAP prevention Daily WUA/SBT Plan one way extubation tomorrow  Acute CVA x2 (R ACA distribution, then L MCA distribution), history of strokes previously At this point no role for secondary stroke prevention  Thrombocytopenia, possible TTP Hold work up given goals of care  Hypertension monitor  Frailty, poor overall functional status Goals of care: this morning the family has decided that that best approach is to withdraw care.  They plan to withdraw care tomorrow after all family have been given a chance to visit her.  Change code status to DNR   Best practice:  Diet: hold tube feeding Pain/Anxiety/Delirium protocol (if indicated): yes VAP protocol (if indicated): yes DVT prophylaxis: scd GI prophylaxis: Pantoprazole for stress ulcer prophylaxis Glucose control: monitor Mobility: bed rest Code Status: DNR Family Communication: updated today in palliative care conversation, they plan to withdraw care Disposition: remain in  ICU, plan withdrawal of care on 7/26  Labs   CBC: Recent Labs  Lab 01/10/2020 1653 01/23/20 1400 01/23/20 2108 01/23/20 2111 01/24/20 0408  WBC 9.2 12.1*  --   --  17.6*    NEUTROABS 5.5  --   --   --   --   HGB 11.1* 9.5*  --  10.9* 9.5*  HCT 37.6 31.0*  --  32.0* 31.0*  MCV 79.7* 75.8*  --   --  78.3*  PLT 95* 16* 8*  --  14*    Basic Metabolic Panel: Recent Labs  Lab 01/01/2020 1653 01/23/20 1400 01/23/20 2111  NA 142 144 144  K 4.3 3.6 3.1*  CL 102 112*  --   CO2 26 21*  --   GLUCOSE 90 112*  --   BUN 15 28*  --   CREATININE 1.10* 1.57*  --   CALCIUM 9.9 9.5  --    GFR: Estimated Creatinine Clearance: 27.6 mL/min (A) (by C-G formula based on SCr of 1.57 mg/dL (H)). Recent Labs  Lab 01/13/2020 1653 01/23/20 1400 01/24/20 0408  WBC 9.2 12.1* 17.6*    Liver Function Tests: Recent Labs  Lab 01/28/2020 1653 01/23/20 2108  AST 23  --   ALT 16  --   ALKPHOS 86  --   BILITOT 0.9 1.6*  PROT 7.9  --   ALBUMIN 4.1  --    No results for input(s): LIPASE, AMYLASE in the last 168 hours. No results for input(s): AMMONIA in the last 168 hours.  ABG    Component Value Date/Time   PHART 7.449 01/23/2020 2111   PCO2ART 35.6 01/23/2020 2111   PO2ART 418 (H) 01/23/2020 2111   HCO3 24.8 01/23/2020 2111   TCO2 26 01/23/2020 2111   O2SAT 100.0 01/23/2020 2111     Coagulation Profile: Recent Labs  Lab 01/04/2020 1653 01/23/20 2108  INR 1.2 1.3*    Cardiac Enzymes: No results for input(s): CKTOTAL, CKMB, CKMBINDEX, TROPONINI in the last 168 hours.  HbA1C: Hgb A1c MFr Bld  Date/Time Value Ref Range Status  01/19/2020 04:45 AM 6.0 (H) 4.8 - 5.6 % Final    Comment:    (NOTE) Pre diabetes:          5.7%-6.4%  Diabetes:              >6.4%  Glycemic control for   <7.0% adults with diabetes   09/11/2015 05:20 AM 6.0 (H) 4.8 - 5.6 % Final    Comment:    (NOTE)         Pre-diabetes: 5.7 - 6.4         Diabetes: >6.4         Glycemic control for adults with diabetes: <7.0     CBG: Recent Labs  Lab 01/22/20 1948 01/22/20 2033 01/23/20 0228 01/23/20 0748 01/23/20 2102  GLUCAP 68* 178* 95 100* 139*       Critical care time:  30 minutes    Roselie Awkward, MD [place] P[facility]M Pager: [phone] Cell: [phone] If no response, call [phone]

## 2020-01-24 NOTE — Plan of Care (Signed)
  Problem: Activity: Goal: Risk for activity intolerance will decrease Outcome: Not Applicable Note: Pt transitioning to comfort care    Problem: Nutrition: Goal: Adequate nutrition will be maintained Outcome: Not Applicable Note: Pt transitioning to comfort care

## 2020-01-24 NOTE — Progress Notes (Signed)
[facility] Progress Note Patient Name: Andrea Keller DOB: 11-05-1945 MRN: [id number]   Date of Service  01/24/2020  HPI/Events of Note  Thrombocytopenia - Hemotology (Dr. Lorenso Keller) reviewed labs and feels that we should proceed with platelet transfusion to keep platelets > 10.  eICU Interventions  Plan: 1. Transfuse 1 unit of single donor platelets now.      Intervention Category Major Interventions: Other:  Andrea Keller 01/24/2020, 2:11 AM

## 2020-01-24 NOTE — Plan of Care (Signed)
Discussed with Dr. [place] Bells. Noted pt is now in comfort care measures. Appreciate CCM, hematology and hospitalist assistance. Neurology will sign off. Please call with questions. Thanks for the consult.  Rosalin Hawking, MD PhD Stroke Neurology 01/24/2020 12:04 PM

## 2020-01-24 NOTE — Progress Notes (Signed)
[place] Progress Note Patient Name: Andrea Keller DOB: [birth year] MRN: [id number]   Date of Service  01/24/2020  HPI/Events of Note  Thrombocytopenia - Platelet count now = 14.   eICU Interventions  Plan: 1. Hold single donor platelet transfusion for now.     Intervention Category Major Interventions: Other:  Lysle Dingwall 01/24/2020, 6:35 AM

## 2020-01-24 NOTE — Progress Notes (Signed)
Upon arrival to perform EEG was informed Discontinue order , per Nurse and [place], due to [place].

## 2020-01-24 NOTE — Consult Note (Signed)
Pt unresponsive no family present at time of visit. The chaplain sang hymns and gospel song. He also read scripture and pt for patient. Further Spiritual Care will be offered upon request.

## 2020-01-25 DIAGNOSIS — Z515 Encounter for palliative care: Secondary | ICD-10-CM

## 2020-01-25 LAB — HAPTOGLOBIN
Haptoglobin: <10 mg/dL — ABNORMAL LOW (ref 42–346)
Haptoglobin: <10 mg/dL — ABNORMAL LOW (ref 42–346)

## 2020-01-25 MED ORDER — HYDROMORPHONE BOLUS VIA INFUSION
0.5000 mg | INTRAVENOUS | Status: DC | PRN
  Administered 2020-01-25: 1 mg via INTRAVENOUS
  Filled 2020-01-25: qty 1

## 2020-01-25 MED ORDER — HYDROMORPHONE BOLUS VIA INFUSION
1.0000 mg | Freq: Once | INTRAVENOUS | Status: AC
  Administered 2020-01-25: 1 mg via INTRAVENOUS
  Filled 2020-01-25: qty 1

## 2020-01-31 NOTE — Procedures (Signed)
Extubation Procedure Note  Patient Details:   Name: Andrea Keller DOB: 1946/02/28 MRN: [id number]   Airway Documentation:    Vent end date: 2020/02/02 Vent end time: 1054   Evaluation  O2 sats: currently acceptable Complications: No apparent complications Patient did tolerate procedure well. Bilateral Breath Sounds: Clear, Diminished   No   Terminal extubation  Roman Dubuc 02-02-2020, 10:55 AM

## 2020-01-31 NOTE — Death Summary Note (Signed)
Name: Andrea Keller MRN: [id number] DOB: 1946/02/26 74 y.o.  Date of Admission: 01/21/2020  3:47 PM Date of Discharge: 02-17-20 Attending Physician: Garner Nash, DO  Discharge Diagnosis: Principal Problem:   Cerebrovascular accident (CVA) due to embolism of cerebral artery ([place]) Active Problems:   PVD (peripheral vascular disease) ([place])   Hypertension   Essential hypertension   HLD (hyperlipidemia)   Acute CVA (cerebrovascular accident) ([place])   History of CVA with residual deficit   Prediabetes   Acute respiratory failure with hypoxia ([place])   Somnolence   Palliative care by specialist   Terminal care   Cause of death: Acute hypoxemic respiratory failure due to inability to protect airway in setting of ischemic stroke  Time of death: 09/05/1406 on Feb 17, 2020  Disposition and follow-up:   [name] was discharged from [facility] in expired condition.    Hospital Course: Patient was admitted on 01/21/2020 with facial asymmetry and found to have multiple R punctate infarct. She had a new L MCA stroke on 7/23,intubated, and transferred to the floor. Palliative care met with family and after discussions patient was move to comfort care. This morning patient was extubated upon family's request and patient passed later with family by her bedside.   Signed: Madalyn Rob, MD 17-Feb-2020, 3:29 PM

## 2020-01-31 NOTE — Progress Notes (Signed)
Pt cardiac time of death 58. Pt passed with sister and daughter at bedside. Bilateral breath sounds nor heart beat heard upon ascultation. Verified by this RN and Adrian Prince, RN.

## 2020-01-31 NOTE — Progress Notes (Signed)
Daily Progress Note   Patient Name: Andrea Keller       Date: Feb 23, 2020 DOB: 1946-01-13  Age: 74 y.o. MRN#: [id number] Attending Physician: Garner Nash, DO Primary Care Physician: Haywood Pao, MD Admit Date: 01/12/2020  Reason for Consultation/Follow-up: Establishing goals of care  Subjective: Patient remains intubated/mechanically ventilated. She appears comfortable on continuous dilaudid infusion. Responds to oral care, otherwise unresponsive.   GOC:  Patient's daughter, Peter Congo and two sisters at bedside. Peter Congo shares that all family members that wished to see her mother, visited yesterday. Peter Congo states they are ready for extubation and her father and brother do not wish to be present for extubation. Discussed compassionate extubation and shift to comfort measures, emphasizing focus on symptom management to ensure comfort as she nears EOL. Prepared them for poor prognosis and 'anything to happen at anytime' once extubated. Could be minutes-hours-days. Peter Congo and patient's sisters are prepared for this. Discussed comfort screen and also transition to 6N if appropriate and if bed available. Family understands and agree with this if necessary. Answered questions regarding shift to comfort. Family requesting chaplain visit.   This NP at bedside during compassionate extubation. RN instructed to give robinul, dilaudid bolus and prn ativan. Dilaudid infusion increased to 47m/hr. Patient appears comfortable following extubation. Family back at bedside. Emotional/spiritual support provided. PMT contact information given.   Length of Stay: 7  Current Medications: Scheduled Meds:  . chlorhexidine gluconate (MEDLINE KIT)  15 mL Mouth Rinse BID    Continuous Infusions: . HYDROmorphone  0.5 mg/hr (008/24/210400)  . levETIRAcetam 500 mg (0Aug 24, [id number])    PRN Meds: acetaminophen **OR** acetaminophen (TYLENOL) oral liquid 160 mg/5 mL **OR** acetaminophen, bisacodyl, glycopyrrolate, HYDROmorphone, LORazepam, ondansetron (ZOFRAN) IV, polyvinyl alcohol  Physical Exam Vitals and nursing note reviewed.  Constitutional:      Interventions: She is intubated.  HENT:     Head: Normocephalic and atraumatic.  Cardiovascular:     Rate and Rhythm: Normal rate.  Pulmonary:     Effort: No tachypnea, accessory muscle usage or respiratory distress. She is intubated.     Breath sounds: Normal breath sounds.  Skin:    General: Skin is warm and dry.  Neurological:     Comments: Minimally responsive just to oral care, on dilaudid gtt  Vital Signs: BP (!) 100/62 (BP Location: Left Arm)   Pulse 84   Temp 98.1 F (36.7 C) (Axillary)   Resp 16   Ht 5' 2"  (1.575 m)   Wt 66.2 kg   SpO2 100%   BMI 26.69 kg/m  SpO2: SpO2: 100 % O2 Device: O2 Device: Ventilator O2 Flow Rate:    Intake/output summary:   Intake/Output Summary (Last 24 hours) at 01-Feb-2020 1030 Last data filed at 02/01/2020 0400 Gross per 24 hour  Intake 1020.77 ml  Output 165 ml  Net 855.77 ml   LBM: Last BM Date:  (PTA) Baseline Weight: Weight: 76.7 kg Most recent weight: Weight: 66.2 kg       Palliative Assessment/Data: PPS 10%      Patient Active Problem List   Diagnosis Date Noted  . Acute respiratory failure with hypoxia ([place])   . Somnolence   . History of CVA with residual deficit   . Prediabetes   . Acute CVA (cerebrovascular accident) ([place]) 01/13/2020  . Cerebrovascular accident (CVA) due to embolism of cerebral artery ([place]) 12/16/2015  . Foot swelling 12/16/2015  . Palpitations 10/21/2015  . CVA (cerebral vascular accident) ([place]) 10/21/2015  . 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 ICH (intracerebral hemorrhage) ([place]) 09/14/2015  . Transient alteration of awareness   .  Other vascular headache   . Aneurysm, cerebral, nonruptured   . Benign essential HTN   . Acute blood loss anemia   . Elevated troponin   . Essential hypertension   . HLD (hyperlipidemia)   . Malignant neoplasm of colon ([place])   . Stroke (cerebrum) ([place]) 09/11/2015  . Intraparenchymal hemorrhage of brain ([place]) 09/10/2015  . Microcytosis   . PVD (peripheral vascular disease) ([place])   . Hypertension   . 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 Thrombocytopenia ([place]) 11/08/2014  . Acute appendicitis 10/28/2014  . Perforated appendicitis 10/28/2014    Palliative Care Assessment & Plan   Patient Profile: Per intake H&P --> Patient is a3 y.o.femalewith history of prior CVA-chronic left-sided hemiparesis-presenting with left facial droop. 7/19 MRI brain>>Multiple punctate foci abnormal diffusion restriction, predominantly within the right hemisphere in a watershed distribution. 7/23 MRI brain>>New, large area of acute ischemia in the posterior left MCA territory. No hemorrhage or mass effect. Rapid response 7/24, patient noted to be unresponsive and hypotensive requiring intubation. Palliative care was asked to get involved to aid on goals of care conversation given her poor prognosis.  Assessment: Acute CVA x2 with history of CVA's Acute hypoxemic respiratory failure, inability to protect airway Thrombocytopenia, possible TTP Frailty, poor functional status  Recommendations/Plan:  Family is ready for compassionate extubation and shift to comfort. They are prepared for poor prognosis and anticipation of hospital demise.   Symptom management  Continue dilaudid infusion  RN may bolus dilaudid via infusion 0.5-42m q378m prn pain/dyspnea/air hunger/tachypnea  Continue prn ativan for seizures/anxiety  Continue prn robinul for secretions  Appreciate chaplain support.   Transfer to 6N per attending if patient remains stable following extubation.   Goals of Care and Additional  Recommendations:  Limitations on Scope of Treatment: Full Comfort Care  Code Status: DNR   Code Status Orders  (From admission, onward)         Start     Ordered   01/24/20 0931  Do not attempt resuscitation (DNR)  Continuous        01/24/20 0930        Code Status History    Date Active Date Inactive  Code Status Order ID Comments User Context   01/09/2020 2319 01/24/2020 0930 Full Code [id number]  Elwyn Reach, MD ED   09/14/2015 1858 09/23/2015 1418 Full Code [id number]  Cathlyn Parsons, PA-C Inpatient   09/11/2015 0012 09/14/2015 1858 Full Code [id number]  Ivor Costa, MD ED   10/28/2014 2321 11/08/2014 1730 Full Code [id number]  Erroll Luna, MD Inpatient   Advance Care Planning Activity       Prognosis:  hours-days  Discharge Planning:  Anticipated Hospital Death  Care plan was discussed with RN, Dr. Valeta Harms, family at bedside (daughter Peter Congo and two sisters)  Thank you for allowing the Palliative Medicine Team to assist in the care of this patient.   Total Time 40 Prolonged Time Billed  no      Greater than 50%  of this time was spent counseling and coordinating care related to the above assessment and plan.  Ihor Dow, DNP, FNP-C Palliative Medicine Team  Phone: [phone] Fax: [phone]  Please contact Palliative Medicine Team phone at [phone] for questions and concerns.

## 2020-01-31 NOTE — Progress Notes (Addendum)
Andrea Keller, MRN:  [id number], DOB:  [birth year], LOS: 7 ADMISSION DATE:  01/13/2020, CONSULTATION DATE:  7/24 REFERRING MD:  Sloan Leiter, CHIEF COMPLAINT:  Code blue/respiratory arrest  Brief History   74 y.o. F admitted 7/19 with L facial droop and found to have subacute cortical areas of punctate ischemia.  Pt received 5m Ativan for repeat head CT and then was found unresponsive and hypoxic.  No loss of pulses, she was intubated and transferred to the intensive care unit.  Past Medical History  Diverticulosis Goblet cell carcinoid Hyperplastic colon polyp Hypertension Microcyt ptosis Osteoarthritis Peripheral vascular disease History of stroke  Significant Hospital Events   7/19 Admit to hospitalists 7/24 Respiratory and mental status decline prompting intubation and ICU txfr  Consults:  Neurology Hematology Palliative  Procedures:  7/24 ETT  Significant Diagnostic Tests:  7/19 CT head>>New areas of cortically based hypoattenuation involving the anterosuperior left frontal lobe 7/19 MRI brain>>Multiple punctate foci abnormal diffusion restriction, predominantly within the right hemisphere in a watershed distribution 7/19 CTA head/neck>>No emergent large vessel occlusion or high-grade stenosis of the intracranial arteries. 7/23 MRI brain>>New, large area of acute ischemia in the posterior left MCA territory. No hemorrhage or mass effect. 7/24 CT head>>Stable appearance of left parietal infarct without hemorrhagic conversion. 7/24 CXR>>Low lung volumes with bibasilar atelectasis.  Micro Data:  7/19, 7/23 Sars-CoV-2>>negative  Antimicrobials:    Interim history/subjective:  Family bedside. They have called pallative care team. Daughter will call father and then notify PCCM/pallaiative this morning to extubate.   Objective   Blood pressure (!) 100/62, pulse 84, temperature 98.1 F (36.7 C), temperature source Axillary, resp. rate 16, height 5' 2"  (1.575 m),  weight 66.2 kg, SpO2 100 %.    Vent Mode: PRVC FiO2 (%):  [40 %] 40 % Set Rate:  [16 bmp] 16 bmp Vt Set:  [400 mL] 400 mL PEEP:  [5 cmH20] 5 cmH20 Plateau Pressure:  [12 cmH20-16 cmH20] 16 cmH20   Intake/Output Summary (Last 24 hours) at [id number] Last data filed at [id number] Gross per 24 hour  Intake 1145.79 ml  Output 165 ml  Net 980.79 ml   Filed Weights   01/15/2020 0757 01/19/20 1051 01/23/20 2200  Weight: 76.7 kg 68.3 kg 66.2 kg    Examination:  General: NAD, nl appearance HEENT: Normocephalic, atraumatic , ETT in place Cardiovascular: Normal rate, regular rhythm.  No murmurs, rubs, or gallops Pulmonary : BL vent breaths  Abdominal: soft, nontender,  bowel sounds present Musculoskeletal: no swelling , deformity, injury ,or tenderness in extremities, Skin: Warm, dry   Resolved Hospital Problem list    Assessment & Plan:  Acute hypoxemic respiratory failure due to inability to protect airway in setting of stroke, generalized weakness Full mechanical vent support VAP prevention Daily WUA/SBT Plan for extubation today  Acute CVA x2 (R ACA distribution, then L MCA distribution), history of strokes previously At this point no role for secondary stroke prevention  Thrombocytopenia, possible TTP Hold work up given goals of care  Hypertension monitor  Frailty, poor overall functional status Goals of care: this morning the family has decided that that best approach is to withdraw care.  They plan to withdraw care once patient husband has arrived.    Best practice:  Diet: hold tube feeding Pain/Anxiety/Delirium protocol (if indicated): yes VAP protocol (if indicated): yes DVT prophylaxis: scd GI prophylaxis: Pantoprazole for stress ulcer prophylaxis Glucose control: monitor Mobility: bed rest Code Status: DNR Family Communication: As above Disposition:  remain in ICU, plan withdrawal of care on 7/26   Labs   CBC: Recent Labs  Lab 01/19/2020 1653  01/23/20 1400 01/23/20 2108 01/23/20 2111 01/24/20 0408  WBC 9.2 12.1*  --   --  17.6*  NEUTROABS 5.5  --   --   --   --   HGB 11.1* 9.5*  --  10.9* 9.5*  HCT 37.6 31.0*  --  32.0* 31.0*  MCV 79.7* 75.8*  --   --  78.3*  PLT 95* 16* 8*  --  14*    Basic Metabolic Panel: Recent Labs  Lab 01/14/2020 1653 01/23/20 1400 01/23/20 2111 01/24/20 0955  NA 142 144 144 144  K 4.3 3.6 3.1* 5.5*  CL 102 112*  --  113*  CO2 26 21*  --  15*  GLUCOSE 90 112*  --  187*  BUN 15 28*  --  37*  CREATININE 1.10* 1.57*  --  2.05*  CALCIUM 9.9 9.5  --  8.9   GFR: Estimated Creatinine Clearance: 21.1 mL/min (A) (by C-G formula based on SCr of 2.05 mg/dL (H)). Recent Labs  Lab 01/05/2020 1653 01/23/20 1400 01/24/20 0408  WBC 9.2 12.1* 17.6*    Liver Function Tests: Recent Labs  Lab 01/05/2020 1653 01/23/20 2108 01/24/20 0955  AST 23  --  74*  ALT 16  --  24  ALKPHOS 86  --  67  BILITOT 0.9 1.6* 2.2*  PROT 7.9  --  6.0*  ALBUMIN 4.1  --  3.0*   No results for input(s): LIPASE, AMYLASE in the last 168 hours. No results for input(s): AMMONIA in the last 168 hours.  ABG    Component Value Date/Time   PHART 7.449 01/23/2020 2111   PCO2ART 35.6 01/23/2020 2111   PO2ART 418 (H) 01/23/2020 2111   HCO3 24.8 01/23/2020 2111   TCO2 26 01/23/2020 2111   O2SAT 100.0 01/23/2020 2111     Coagulation Profile: Recent Labs  Lab 01/19/2020 1653 01/23/20 2108  INR 1.2 1.3*    Cardiac Enzymes: No results for input(s): CKTOTAL, CKMB, CKMBINDEX, TROPONINI in the last 168 hours.  HbA1C: Hgb A1c MFr Bld  Date/Time Value Ref Range Status  01/19/2020 04:45 AM 6.0 (H) 4.8 - 5.6 % Final    Comment:    (NOTE) Pre diabetes:          5.7%-6.4%  Diabetes:              >6.4%  Glycemic control for   <7.0% adults with diabetes   09/11/2015 05:20 AM 6.0 (H) 4.8 - 5.6 % Final    Comment:    (NOTE)         Pre-diabetes: 5.7 - 6.4         Diabetes: >6.4         Glycemic control for adults with  diabetes: <7.0     CBG: Recent Labs  Lab 01/22/20 2033 01/23/20 0228 01/23/20 0748 01/23/20 2102 01/24/20 0813  GLUCAP 178* 95 100* 139* <10*       Tamsen Snider, MD PGY2    PCCM:  We appreciate palliative care input. Family are ready for withdrawal of care.   I have met with family members at bedside all questions answered.   Withdrawal orders placed by palliative care team.   Stable for transfer to 6N if patient remains stable post extubation   Garner Nash, DO [facility] 02/18/2020 11:56 AM

## 2020-01-31 NOTE — Progress Notes (Signed)
   2020-02-01 1100  Clinical Encounter Type  Visited With Patient and family together;Health care provider  Visit Type Patient actively dying  Referral From Palliative care team  Consult/Referral To Chaplain  Spiritual Encounters  Spiritual Needs Emotional;Prayer  Stress Factors  Patient Stress Factors Loss  Family Stress Factors Major life changes   Chaplain responded to end of life consult. Chaplain arrived just  Following compassionate extubation Chaplain offered family support via ministry of presence and prayer. Chaplains remain available for support as needs arise.   Chaplain Resident, Evelene Croon, M Div [phone] on-call pager

## 2020-01-31 NOTE — Progress Notes (Signed)
Wasted 1mls of Dilaudid in the Stericycle with Junius Argyle, RN

## 2020-01-31 DEATH — deceased

## 2020-02-13 DIAGNOSIS — Z7189 Other specified counseling: Secondary | ICD-10-CM

## 2020-02-13 DIAGNOSIS — Z66 Do not resuscitate: Secondary | ICD-10-CM

## 2020-02-15 LAB — PLATELET BY CITRATE
